# Patient Record
Sex: Female | Born: 1945
Health system: Southern US, Community
[De-identification: ages and names within clinical notes are randomized; demographics above are authoritative.]

## PROBLEM LIST (undated history)

## (undated) DIAGNOSIS — D519 Vitamin B12 deficiency anemia, unspecified: Secondary | ICD-10-CM

## (undated) DIAGNOSIS — I776 Arteritis, unspecified: Secondary | ICD-10-CM

## (undated) DIAGNOSIS — I2510 Atherosclerotic heart disease of native coronary artery without angina pectoris: Secondary | ICD-10-CM

## (undated) DIAGNOSIS — R52 Pain, unspecified: Secondary | ICD-10-CM

## (undated) DIAGNOSIS — K219 Gastro-esophageal reflux disease without esophagitis: Secondary | ICD-10-CM

## (undated) DIAGNOSIS — D696 Thrombocytopenia, unspecified: Secondary | ICD-10-CM

## (undated) DIAGNOSIS — M810 Age-related osteoporosis without current pathological fracture: Secondary | ICD-10-CM

## (undated) DIAGNOSIS — K5792 Diverticulitis of intestine, part unspecified, without perforation or abscess without bleeding: Secondary | ICD-10-CM

## (undated) DIAGNOSIS — D649 Anemia, unspecified: Secondary | ICD-10-CM

## (undated) DIAGNOSIS — M1990 Unspecified osteoarthritis, unspecified site: Secondary | ICD-10-CM

## (undated) DIAGNOSIS — I10 Essential (primary) hypertension: Secondary | ICD-10-CM

## (undated) DIAGNOSIS — J449 Chronic obstructive pulmonary disease, unspecified: Secondary | ICD-10-CM

## (undated) DIAGNOSIS — B349 Viral infection, unspecified: Secondary | ICD-10-CM

## (undated) DIAGNOSIS — E785 Hyperlipidemia, unspecified: Secondary | ICD-10-CM

## (undated) HISTORY — DX: Thrombocytopenia, unspecified: D69.6

## (undated) HISTORY — DX: Viral infection, unspecified: B34.9

## (undated) HISTORY — DX: Diverticulitis of intestine, part unspecified, without perforation or abscess without bleeding: K57.92

## (undated) HISTORY — DX: Unspecified osteoarthritis, unspecified site: M19.90

## (undated) HISTORY — PX: TUBAL LIGATION: SHX77

## (undated) HISTORY — DX: Essential (primary) hypertension: I10

## (undated) HISTORY — DX: Anemia, unspecified: D64.9

## (undated) HISTORY — DX: Chronic obstructive pulmonary disease, unspecified: J44.9

## (undated) HISTORY — DX: Pain, unspecified: R52

## (undated) HISTORY — PX: APPENDECTOMY: SHX54

## (undated) HISTORY — PX: ABDOMINAL HYSTERECTOMY: SHX81

## (undated) HISTORY — DX: Arteritis, unspecified: I77.6

## (undated) HISTORY — DX: Hyperlipidemia, unspecified: E78.5

## (undated) HISTORY — DX: Atherosclerotic heart disease of native coronary artery without angina pectoris: I25.10

---

## 2011-05-26 DIAGNOSIS — L738 Other specified follicular disorders: Secondary | ICD-10-CM | POA: Diagnosis not present

## 2011-05-26 DIAGNOSIS — B351 Tinea unguium: Secondary | ICD-10-CM | POA: Diagnosis not present

## 2011-06-30 DIAGNOSIS — H2510 Age-related nuclear cataract, unspecified eye: Secondary | ICD-10-CM | POA: Diagnosis not present

## 2011-10-06 DIAGNOSIS — E559 Vitamin D deficiency, unspecified: Secondary | ICD-10-CM | POA: Diagnosis not present

## 2011-10-06 DIAGNOSIS — E782 Mixed hyperlipidemia: Secondary | ICD-10-CM | POA: Diagnosis not present

## 2011-10-06 DIAGNOSIS — I10 Essential (primary) hypertension: Secondary | ICD-10-CM | POA: Diagnosis not present

## 2011-10-08 DIAGNOSIS — I10 Essential (primary) hypertension: Secondary | ICD-10-CM | POA: Diagnosis not present

## 2011-10-08 DIAGNOSIS — E782 Mixed hyperlipidemia: Secondary | ICD-10-CM | POA: Diagnosis not present

## 2011-10-08 DIAGNOSIS — E559 Vitamin D deficiency, unspecified: Secondary | ICD-10-CM | POA: Diagnosis not present

## 2011-11-15 DIAGNOSIS — S058X9A Other injuries of unspecified eye and orbit, initial encounter: Secondary | ICD-10-CM | POA: Diagnosis not present

## 2012-02-13 DIAGNOSIS — E782 Mixed hyperlipidemia: Secondary | ICD-10-CM | POA: Diagnosis not present

## 2012-02-13 DIAGNOSIS — M810 Age-related osteoporosis without current pathological fracture: Secondary | ICD-10-CM | POA: Diagnosis not present

## 2012-02-13 DIAGNOSIS — Z0000 Encounter for general adult medical examination without abnormal findings: Secondary | ICD-10-CM | POA: Diagnosis not present

## 2012-02-13 DIAGNOSIS — Z1231 Encounter for screening mammogram for malignant neoplasm of breast: Secondary | ICD-10-CM | POA: Diagnosis not present

## 2012-02-13 DIAGNOSIS — Z23 Encounter for immunization: Secondary | ICD-10-CM | POA: Diagnosis not present

## 2012-02-13 DIAGNOSIS — N390 Urinary tract infection, site not specified: Secondary | ICD-10-CM | POA: Diagnosis not present

## 2012-02-13 DIAGNOSIS — I10 Essential (primary) hypertension: Secondary | ICD-10-CM | POA: Diagnosis not present

## 2012-02-13 DIAGNOSIS — F17200 Nicotine dependence, unspecified, uncomplicated: Secondary | ICD-10-CM | POA: Diagnosis not present

## 2012-02-13 DIAGNOSIS — E7800 Pure hypercholesterolemia, unspecified: Secondary | ICD-10-CM | POA: Diagnosis not present

## 2012-02-23 DIAGNOSIS — Z1231 Encounter for screening mammogram for malignant neoplasm of breast: Secondary | ICD-10-CM | POA: Diagnosis not present

## 2012-02-23 DIAGNOSIS — M810 Age-related osteoporosis without current pathological fracture: Secondary | ICD-10-CM | POA: Diagnosis not present

## 2012-06-30 DIAGNOSIS — H2510 Age-related nuclear cataract, unspecified eye: Secondary | ICD-10-CM | POA: Diagnosis not present

## 2012-09-07 DIAGNOSIS — R1011 Right upper quadrant pain: Secondary | ICD-10-CM | POA: Diagnosis not present

## 2012-09-07 DIAGNOSIS — M5380 Other specified dorsopathies, site unspecified: Secondary | ICD-10-CM | POA: Diagnosis not present

## 2012-09-13 DIAGNOSIS — R1011 Right upper quadrant pain: Secondary | ICD-10-CM | POA: Diagnosis not present

## 2012-09-13 DIAGNOSIS — R109 Unspecified abdominal pain: Secondary | ICD-10-CM | POA: Diagnosis not present

## 2012-09-13 DIAGNOSIS — K824 Cholesterolosis of gallbladder: Secondary | ICD-10-CM | POA: Diagnosis not present

## 2012-09-20 DIAGNOSIS — I10 Essential (primary) hypertension: Secondary | ICD-10-CM | POA: Diagnosis not present

## 2012-09-30 DIAGNOSIS — Z1211 Encounter for screening for malignant neoplasm of colon: Secondary | ICD-10-CM | POA: Diagnosis not present

## 2012-11-22 DIAGNOSIS — F17200 Nicotine dependence, unspecified, uncomplicated: Secondary | ICD-10-CM | POA: Diagnosis not present

## 2012-11-22 DIAGNOSIS — I10 Essential (primary) hypertension: Secondary | ICD-10-CM | POA: Diagnosis not present

## 2012-11-22 DIAGNOSIS — E785 Hyperlipidemia, unspecified: Secondary | ICD-10-CM | POA: Diagnosis not present

## 2012-11-22 DIAGNOSIS — K644 Residual hemorrhoidal skin tags: Secondary | ICD-10-CM | POA: Diagnosis not present

## 2012-11-22 DIAGNOSIS — Z1211 Encounter for screening for malignant neoplasm of colon: Secondary | ICD-10-CM | POA: Diagnosis not present

## 2012-11-22 DIAGNOSIS — E559 Vitamin D deficiency, unspecified: Secondary | ICD-10-CM | POA: Diagnosis not present

## 2012-11-22 DIAGNOSIS — K648 Other hemorrhoids: Secondary | ICD-10-CM | POA: Diagnosis not present

## 2012-11-22 DIAGNOSIS — K589 Irritable bowel syndrome without diarrhea: Secondary | ICD-10-CM | POA: Diagnosis not present

## 2012-11-22 DIAGNOSIS — Z79899 Other long term (current) drug therapy: Secondary | ICD-10-CM | POA: Diagnosis not present

## 2012-11-22 DIAGNOSIS — M810 Age-related osteoporosis without current pathological fracture: Secondary | ICD-10-CM | POA: Diagnosis not present

## 2012-11-22 DIAGNOSIS — M818 Other osteoporosis without current pathological fracture: Secondary | ICD-10-CM | POA: Diagnosis not present

## 2012-11-22 DIAGNOSIS — K5730 Diverticulosis of large intestine without perforation or abscess without bleeding: Secondary | ICD-10-CM | POA: Diagnosis not present

## 2012-11-22 HISTORY — PX: COLONOSCOPY: SHX174

## 2012-11-22 LAB — HM COLONOSCOPY

## 2013-03-14 DIAGNOSIS — E782 Mixed hyperlipidemia: Secondary | ICD-10-CM | POA: Diagnosis not present

## 2013-03-14 DIAGNOSIS — Z0000 Encounter for general adult medical examination without abnormal findings: Secondary | ICD-10-CM | POA: Diagnosis not present

## 2013-03-14 DIAGNOSIS — E7800 Pure hypercholesterolemia, unspecified: Secondary | ICD-10-CM | POA: Diagnosis not present

## 2013-03-14 DIAGNOSIS — Z23 Encounter for immunization: Secondary | ICD-10-CM | POA: Diagnosis not present

## 2013-03-14 DIAGNOSIS — I10 Essential (primary) hypertension: Secondary | ICD-10-CM | POA: Diagnosis not present

## 2013-03-14 DIAGNOSIS — Z1231 Encounter for screening mammogram for malignant neoplasm of breast: Secondary | ICD-10-CM | POA: Diagnosis not present

## 2013-03-14 DIAGNOSIS — M810 Age-related osteoporosis without current pathological fracture: Secondary | ICD-10-CM | POA: Diagnosis not present

## 2013-03-14 DIAGNOSIS — E559 Vitamin D deficiency, unspecified: Secondary | ICD-10-CM | POA: Diagnosis not present

## 2013-04-08 DIAGNOSIS — M810 Age-related osteoporosis without current pathological fracture: Secondary | ICD-10-CM | POA: Diagnosis not present

## 2013-04-08 DIAGNOSIS — Z1231 Encounter for screening mammogram for malignant neoplasm of breast: Secondary | ICD-10-CM | POA: Diagnosis not present

## 2013-04-08 LAB — HM DEXA SCAN

## 2013-07-01 DIAGNOSIS — H2510 Age-related nuclear cataract, unspecified eye: Secondary | ICD-10-CM | POA: Diagnosis not present

## 2013-10-12 DIAGNOSIS — R5383 Other fatigue: Secondary | ICD-10-CM | POA: Diagnosis not present

## 2013-10-12 DIAGNOSIS — S30860A Insect bite (nonvenomous) of lower back and pelvis, initial encounter: Secondary | ICD-10-CM | POA: Diagnosis not present

## 2013-10-12 DIAGNOSIS — A938 Other specified arthropod-borne viral fevers: Secondary | ICD-10-CM | POA: Diagnosis not present

## 2013-10-12 DIAGNOSIS — R5381 Other malaise: Secondary | ICD-10-CM | POA: Diagnosis not present

## 2013-10-12 DIAGNOSIS — R110 Nausea: Secondary | ICD-10-CM | POA: Diagnosis not present

## 2013-10-13 DIAGNOSIS — R509 Fever, unspecified: Secondary | ICD-10-CM | POA: Diagnosis not present

## 2013-10-13 DIAGNOSIS — R51 Headache: Secondary | ICD-10-CM | POA: Diagnosis not present

## 2013-10-13 DIAGNOSIS — D696 Thrombocytopenia, unspecified: Secondary | ICD-10-CM | POA: Diagnosis not present

## 2013-10-13 DIAGNOSIS — B9789 Other viral agents as the cause of diseases classified elsewhere: Secondary | ICD-10-CM | POA: Diagnosis not present

## 2013-10-18 DIAGNOSIS — D696 Thrombocytopenia, unspecified: Secondary | ICD-10-CM | POA: Diagnosis not present

## 2013-10-18 DIAGNOSIS — J069 Acute upper respiratory infection, unspecified: Secondary | ICD-10-CM | POA: Diagnosis not present

## 2013-10-18 DIAGNOSIS — E871 Hypo-osmolality and hyponatremia: Secondary | ICD-10-CM | POA: Diagnosis not present

## 2013-10-28 DIAGNOSIS — R1032 Left lower quadrant pain: Secondary | ICD-10-CM | POA: Diagnosis not present

## 2013-10-28 DIAGNOSIS — N3000 Acute cystitis without hematuria: Secondary | ICD-10-CM | POA: Diagnosis not present

## 2013-10-28 DIAGNOSIS — R935 Abnormal findings on diagnostic imaging of other abdominal regions, including retroperitoneum: Secondary | ICD-10-CM | POA: Diagnosis not present

## 2013-10-28 DIAGNOSIS — R52 Pain, unspecified: Secondary | ICD-10-CM

## 2013-10-28 DIAGNOSIS — I776 Arteritis, unspecified: Secondary | ICD-10-CM | POA: Diagnosis not present

## 2013-10-28 HISTORY — DX: Pain, unspecified: R52

## 2013-11-01 DIAGNOSIS — I776 Arteritis, unspecified: Secondary | ICD-10-CM

## 2013-11-01 HISTORY — DX: Arteritis, unspecified: I77.6

## 2013-11-08 ENCOUNTER — Encounter: Payer: Self-pay | Admitting: Surgery

## 2013-11-25 ENCOUNTER — Encounter: Payer: Self-pay | Admitting: Surgery

## 2013-11-28 ENCOUNTER — Encounter: Payer: Self-pay | Admitting: Surgery

## 2013-11-28 ENCOUNTER — Ambulatory Visit (INDEPENDENT_AMBULATORY_CARE_PROVIDER_SITE_OTHER): Payer: Medicare Other | Admitting: Surgery

## 2013-11-28 VITALS — BP 158/70 | HR 70 | Ht 64.0 in | Wt 121.5 lb

## 2013-11-28 DIAGNOSIS — I714 Abdominal aortic aneurysm, without rupture, unspecified: Secondary | ICD-10-CM

## 2013-11-28 DIAGNOSIS — I776 Arteritis, unspecified: Secondary | ICD-10-CM | POA: Diagnosis not present

## 2013-11-28 NOTE — Progress Notes (Signed)
Patient name: Carla Romero MRN: [id number] DOB: 27-Jan-1946 Sex: female   Referred by: Dr. Sedalia Muta  Reason for referral:  Chief Complaint  Patient presents with  . New Evaluation    aortitis    HISTORY OF PRESENT ILLNESS: This is a 68 year old female who is referred today for evaluation of abdominal pathology seen on CT scan.  The patient's story begins around father is day.  She was confused initially brought to urgent care and then sent to the emergency department.  She had received several take bites and was treated with doxycycline for [facility] spotted fever.  Ultimately, she was diagnosed with line disease and treated with amoxicillin.  In mid July she began complaining of low back pain and abdominal pain which radiated around to her groin.  She has a history of diverticulosis and therefore was sent to the hospital for CT scan of her abdomen and pelvis.  This revealed some inflammatory changes around her aorta.  She went on to have an elevated C-reactive protein.  She was referred today for vascular evaluation.  The patient is medically managed for hypertension.  She is a smoker.  Past Medical History  Diagnosis Date  . Pain October 28, 2013    Left Lower quadrant / back  . Hyperlipidemia   . Hypertension   . Arthritis     Osteoporosis  . Diverticulitis   . Anemia     Vit D deficiency  . Thrombocytopenia   . Acute viral syndrome   . COPD (chronic obstructive pulmonary disease)   . CAD (coronary artery disease)   . CAP (community acquired pneumonia)   . Diabetes mellitus without complication   . Arteritis, unspecified November 01, 2013    Past Surgical History  Procedure Laterality Date  . Colonoscopy  Aug. 4, 2014  . Abdominal hysterectomy    . Av fistula placement      History   Social History  . Marital Status: Widowed    Spouse Name: N/A    Number of Children: N/A  . Years of Education: N/A   Occupational History  . Not on file.   Social History Main  Topics  . Smoking status: Current Every Day Smoker -- 0.25 packs/day for 40 years    Types: Cigarettes  . Smokeless tobacco: Never Used  . Alcohol Use: 2.8 - 3.9 oz/week    3-4 Glasses of wine, 2-3 Drinks containing 0.5 oz of alcohol per week  . Drug Use: No  . Sexual Activity: Not on file   Other Topics Concern  . Not on file   Social History Narrative  . No narrative on file    Family History  Problem Relation Age of Onset  . Hypertension Mother   . Hyperlipidemia Mother     Allergies as of 11/28/2013 - Review Complete 11/28/2013  Allergen Reaction Noted  . Ace inhibitors Cough 11/08/2013  . Trilipix [choline fenofibrate] Other (See Comments) 11/08/2013  . Zocor [simvastatin] Other (See Comments) 11/08/2013    Current Outpatient Prescriptions on File Prior to Visit  Medication Sig Dispense Refill  . losartan-hydrochlorothiazide (HYZAAR) 100-25 MG per tablet Take 1 tablet by mouth daily.      [name] amoxicillin (AMOXIL) 875 MG tablet Take 875 mg by mouth every 12 (twelve) hours.      . calcium-vitamin D (OSCAL WITH D) 250-125 MG-UNIT per tablet Take 1 tablet by mouth daily.      . traMADol (ULTRAM) 50 MG tablet Take by mouth  every 6 (six) hours as needed. 1/2   TO  1  Tab  Every 6 hours       No current facility-administered medications on file prior to visit.     REVIEW OF SYSTEMS: Please see history of present illness, otherwise all systems are negative  PHYSICAL EXAMINATION: General: The patient appears their stated age.  Vital signs are BP 158/70  Pulse 70  Ht 5\' 4"  (1.626 m)  Wt 121 lb 8 oz (55.112 kg)  BMI 20.85 kg/m2  SpO2 100% HEENT:  No gross abnormalities Pulmonary: Respirations are non-labored Abdomen: Soft and non-tender.  Aorta is easily palpable and nontender  Musculoskeletal: There are no major deformities.   Neurologic: No focal weakness or paresthesias are detected, Skin: There are no ulcer or rashes noted. Psychiatric: The patient has normal  affect. Cardiovascular: There is a regular rate and rhythm without significant murmur appreciated.  No carotid bruit  Diagnostic Studies: I have reviewed her CT scan which shows inflammatory changes around her infrarenal aorta.  The aorta is thickwalled with a maximum diameter of 2.8 cm    Assessment:  Possible aortitis by CT scan Plan: The patient most likely had inflammatory changes secondary to her line disease.  Currently, she is asymptomatic.  There is minimal aneurysmal change to the aorta.  There is no fall for surgical intervention at this time, however I do think that she warrants repeat imaging to see if the inflammatory process has gotten worse or resolve.  I suspect that this will go a with time.  I will have her back to see me in 3 months with a repeat CT scan.  If there is persistence of the aortitis, she may benefit from referral to a rheumatologist     V. Charlena CrossWells Cheynne Virden IV, M.D. [facility] [place] Office: [phone] Pager:  [phone]

## 2013-11-28 NOTE — Addendum Note (Signed)
Addended by: Sharee PimpleMCCHESNEY, MARILYN K on: 11/28/2013 12:29 PM   Modules accepted: Orders

## 2014-01-30 ENCOUNTER — Other Ambulatory Visit: Payer: Self-pay | Admitting: Surgery

## 2014-01-30 DIAGNOSIS — I714 Abdominal aortic aneurysm, without rupture: Secondary | ICD-10-CM | POA: Diagnosis not present

## 2014-01-30 LAB — CREATININE, SERUM: Creat: 1 mg/dL (ref 0.50–1.10)

## 2014-01-30 LAB — BUN: BUN: 13 mg/dL (ref 6–23)

## 2014-03-03 ENCOUNTER — Encounter: Payer: Self-pay | Admitting: Surgery

## 2014-03-06 ENCOUNTER — Encounter: Payer: Self-pay | Admitting: *Deleted

## 2014-03-06 ENCOUNTER — Ambulatory Visit
Admission: RE | Admit: 2014-03-06 | Discharge: 2014-03-06 | Disposition: A | Discharge status: Home / Self Care | Payer: Medicare Other | Source: Ambulatory Visit | Attending: Surgery | Admitting: Surgery

## 2014-03-06 ENCOUNTER — Ambulatory Visit (INDEPENDENT_AMBULATORY_CARE_PROVIDER_SITE_OTHER): Payer: Medicare Other | Admitting: Surgery

## 2014-03-06 ENCOUNTER — Encounter: Payer: Self-pay | Admitting: Surgery

## 2014-03-06 VITALS — BP 171/86 | HR 76 | Temp 98.0°F | Resp 16 | Ht 64.5 in | Wt 123.8 lb

## 2014-03-06 DIAGNOSIS — I701 Atherosclerosis of renal artery: Secondary | ICD-10-CM | POA: Diagnosis not present

## 2014-03-06 DIAGNOSIS — I714 Abdominal aortic aneurysm, without rupture, unspecified: Secondary | ICD-10-CM

## 2014-03-06 DIAGNOSIS — I776 Arteritis, unspecified: Secondary | ICD-10-CM | POA: Diagnosis not present

## 2014-03-06 MED ORDER — IOHEXOL 350 MG/ML SOLN
80.0000 mL | Freq: Once | INTRAVENOUS | Status: AC | PRN
  Administered 2014-03-06: 80 mL via INTRAVENOUS

## 2014-03-06 NOTE — Addendum Note (Signed)
Addended by: Sharee PimpleMCCHESNEY, Maryann Mccall K on: 03/06/2014 01:35 PM   Modules accepted: Orders

## 2014-03-06 NOTE — Progress Notes (Signed)
Patient name: Carla DanielKathryn Bearse MRN: [id number] DOB: 09/15/1945 Sex: female     Chief Complaint  Patient presents with  . Follow-up    Patient has not had any more abdominal or lower back pain     HISTORY OF PRESENT ILLNESS: This is a 68 year old female who is referred today for evaluation of abdominal pathology seen on CT scan. The patient's story begins around father is day. She was confused initially brought to urgent care and then sent to the emergency department. She had received several take bites and was [facility]ine [facility]. Ultimately, she was diagnosed with line disease and treated with amoxicillin. In mid July she began complaining of low back pain and abdominal pain which radiated around to her groin. She has a history of diverticulosis and therefore was sent to the hospital for CT scan of her abdomen and pelvis. This revealed some inflammatory changes around her aorta. She went on to have an elevated C-reactive protein. She was referred today for vascular evaluation.  The patient reports no complaints today.  Her back pain has resolved.  Past Medical History  Diagnosis Date  . Pain October 28, 2013    Left Lower quadrant / back  . Hyperlipidemia   . Hypertension   . Arthritis     Osteoporosis  . Diverticulitis   . Anemia     Vit D deficiency  . Thrombocytopenia   . Acute viral syndrome   . COPD (chronic obstructive pulmonary disease)   . CAD (coronary artery disease)   . CAP (community acquired pneumonia)   . Diabetes mellitus without complication   . Arteritis, unspecified November 01, 2013    Past Surgical History  Procedure Laterality Date  . Colonoscopy  Aug. 4, 2014  . Abdominal hysterectomy    . Av fistula placement      History   Social History  . Marital Status: Widowed    Spouse Name: N/A    Number of Children: N/A  . Years of Education: N/A   Occupational History  . Not on file.   Social History Main  Topics  . Smoking status: Current Every Day Smoker -- 0.30 packs/day for 40 years    Types: Cigarettes  . Smokeless tobacco: Never Used  . Alcohol Use: 3.0 - 4.2 oz/week    3-4 Glasses of wine, 2-3 Not specified per week  . Drug Use: No  . Sexual Activity: Not on file   Other Topics Concern  . Not on file   Social History Narrative    Family History  Problem Relation Age of Onset  . Hypertension Mother   . Hyperlipidemia Mother     Allergies as of 03/06/2014 - Review Complete 03/06/2014  Allergen Reaction Noted  . Ace inhibitors Cough 11/08/2013  . Trilipix [choline fenofibrate] Other (See Comments) 11/08/2013  . Zocor [simvastatin] Other (See Comments) 11/08/2013    Current Outpatient Prescriptions on File Prior to Visit  Medication Sig Dispense Refill  . amoxicillin (AMOXIL) 875 MG tablet Take 875 mg by mouth every 12 (twelve) hours.    . calcium-vitamin D (OSCAL WITH D) 250-125 MG-UNIT per tablet Take 1 tablet by mouth daily.    [name]. losartan-hydrochlorothiazide (HYZAAR) 100-25 MG per tablet Take 1 tablet by mouth daily.    . traMADol (ULTRAM) 50 MG tablet Take by mouth every 6 (six) hours as needed. 1/2   TO  1  Tab  Every 6 hours  No current facility-administered medications on file prior to visit.     REVIEW OF SYSTEMS: Cardiovascular: No chest pain, chest pressure, palpitations, orthopnea, or dyspnea on exertion. No claudication or rest pain,  No history of DVT or phlebitis. Pulmonary: No productive cough, asthma or wheezing. Neurologic: No weakness, paresthesias, aphasia, or amaurosis. No dizziness. Hematologic: No bleeding problems or clotting disorders. Musculoskeletal: No joint pain or joint swelling. Gastrointestinal: No blood in stool or hematemesis Genitourinary: No dysuria or hematuria. Psychiatric:: No history of major depression. Integumentary: No rashes or ulcers. Constitutional: No fever or chills.  PHYSICAL EXAMINATION:   Vital signs are BP  171/86 mmHg  Pulse 76  Temp(Src) 98 F (36.7 C) (Oral)  Resp 16  Ht 5' 4.5" (1.638 m)  Wt 123 lb 12.8 oz (56.155 kg)  BMI 20.93 kg/m2  SpO2 100% General: The patient appears their stated age. HEENT:  No gross abnormalities Pulmonary:  Non labored breathing Abdomen: Soft and non-tender Musculoskeletal: There are no major deformities. Neurologic: No focal weakness or paresthesias are detected, Skin: There are no ulcer or rashes noted. Psychiatric: The patient has normal affect. Cardiovascular: palpable pedal pulses   Diagnostic Studies I have reviewed her CT angiogram which has the following observations: Significant improvement if not complete resolution of inflammatory changes around the aorta.  I do see thrombus within the aorta at the level of the visceral section and a small ulcer  Assessment: aortitis Plan: The patient's CT scan today shows near complete resolution.  There is ectasia of the aorta which will need to be followed.  I also see an ulcer which is very small at her renal arteries.  For that reason, she will need continued observation.  I would recommend a CT scan periodically.  Otherwise, I will follow her with ultrasound, the next will be in one year.  Jorge NyV. Wells Brabham IV, M.D. [facility] [place] Office: [phone] Pager:  [phone]

## 2014-03-06 NOTE — Patient Instructions (Signed)
Please review the tobacco cessation information given to you today. It lists many hints that are useful in your effort to stop smoking. The [employer] [facility] contact phone # is [phone] These nurses and advisors offer lots of FREE information and aids to help you quit.    The [place] [facility]uit Smoking line #  800-784-8669, they will also assist you with programs designed to help you stop smoking.    

## 2014-03-20 DIAGNOSIS — Z0000 Encounter for general adult medical examination without abnormal findings: Secondary | ICD-10-CM | POA: Diagnosis not present

## 2014-03-20 DIAGNOSIS — E782 Mixed hyperlipidemia: Secondary | ICD-10-CM | POA: Diagnosis not present

## 2014-03-20 DIAGNOSIS — Z23 Encounter for immunization: Secondary | ICD-10-CM | POA: Diagnosis not present

## 2014-03-20 DIAGNOSIS — I10 Essential (primary) hypertension: Secondary | ICD-10-CM | POA: Diagnosis not present

## 2014-04-11 DIAGNOSIS — Z1231 Encounter for screening mammogram for malignant neoplasm of breast: Secondary | ICD-10-CM | POA: Diagnosis not present

## 2015-03-12 ENCOUNTER — Other Ambulatory Visit (HOSPITAL_COMMUNITY): Payer: Medicare Other

## 2015-03-12 ENCOUNTER — Ambulatory Visit: Payer: Medicare Other | Admitting: Surgery

## 2015-07-17 DIAGNOSIS — H2513 Age-related nuclear cataract, bilateral: Secondary | ICD-10-CM | POA: Diagnosis not present

## 2015-09-26 DIAGNOSIS — I10 Essential (primary) hypertension: Secondary | ICD-10-CM | POA: Diagnosis not present

## 2015-09-26 DIAGNOSIS — E782 Mixed hyperlipidemia: Secondary | ICD-10-CM | POA: Diagnosis not present

## 2015-10-02 DIAGNOSIS — L814 Other melanin hyperpigmentation: Secondary | ICD-10-CM | POA: Diagnosis not present

## 2015-10-02 DIAGNOSIS — L570 Actinic keratosis: Secondary | ICD-10-CM | POA: Diagnosis not present

## 2016-01-20 DIAGNOSIS — L2089 Other atopic dermatitis: Secondary | ICD-10-CM | POA: Diagnosis not present

## 2016-01-20 DIAGNOSIS — L299 Pruritus, unspecified: Secondary | ICD-10-CM | POA: Diagnosis not present

## 2016-02-08 DIAGNOSIS — R202 Paresthesia of skin: Secondary | ICD-10-CM | POA: Diagnosis not present

## 2016-02-08 DIAGNOSIS — M79662 Pain in left lower leg: Secondary | ICD-10-CM | POA: Diagnosis not present

## 2016-02-08 DIAGNOSIS — Z23 Encounter for immunization: Secondary | ICD-10-CM | POA: Diagnosis not present

## 2016-02-08 DIAGNOSIS — M79661 Pain in right lower leg: Secondary | ICD-10-CM | POA: Diagnosis not present

## 2016-02-23 IMAGING — CT CT CTA ABD/PEL W/CM AND/OR W/O CM
1 of 7 series · 12 of 36 positions shown, 18 images · IV contrast (80CC OMNI 350)
Comparison: CT scan of October 28, 2013.

CLINICAL DATA: Abdominal aortic aneurysm without rupture.

EXAM:
CTA ABDOMEN AND PELVIS wITHOUT AND WITH CONTRAST
TECHNIQUE: Multidetector CT imaging of the abdomen and pelvis was performed
using the standard protocol during bolus administration of
intravenous contrast. Multiplanar reconstructed images and MIPs were
obtained and reviewed to evaluate the vascular anatomy.
CONTRAST:  80mL OMNIPAQUE IOHEXOL 350 MG/ML SOLN

[Series 4: angio (id) · axial · 0.68mm/px · z∈[-420,-60]mm · 12 of 170 slices shown, 18 images]
[im 13/170  soft-tissue]
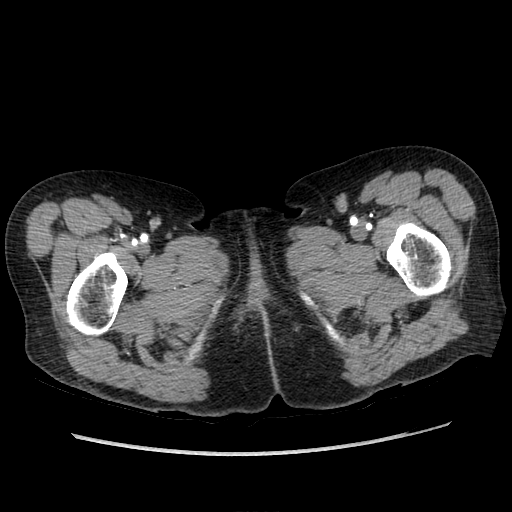
[im 13/170  bone]
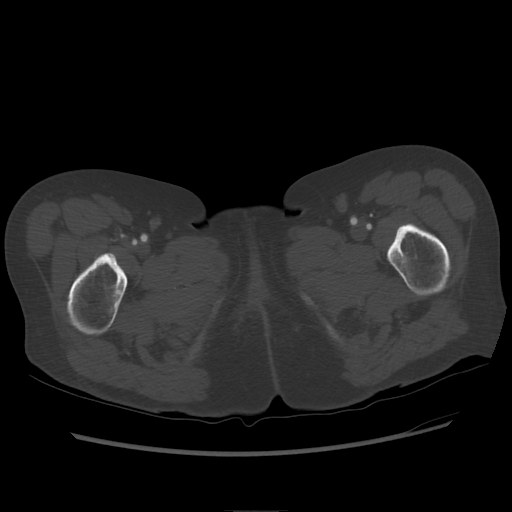
[im 25/170  soft-tissue]
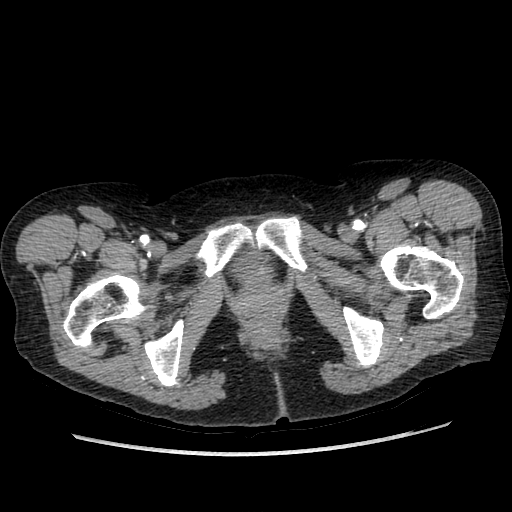
[im 37/170  soft-tissue]
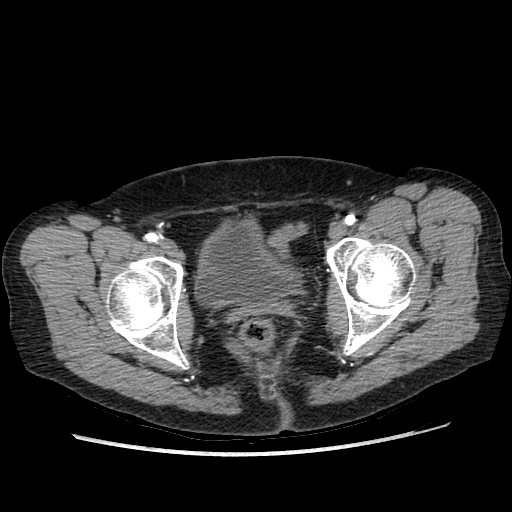
[im 49/170  soft-tissue]
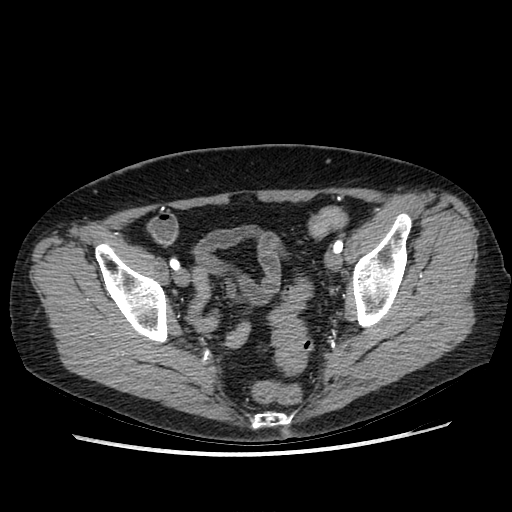
[im 61/170  soft-tissue]
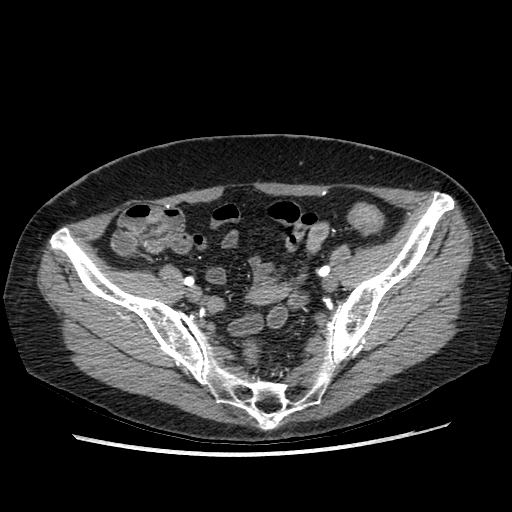
[im 73/170  soft-tissue]
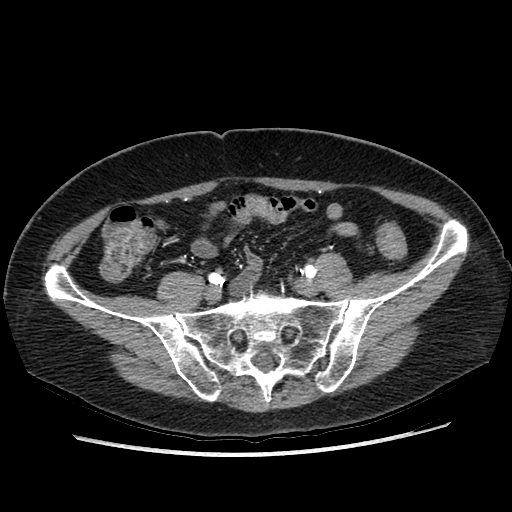
[im 97/170  soft-tissue]
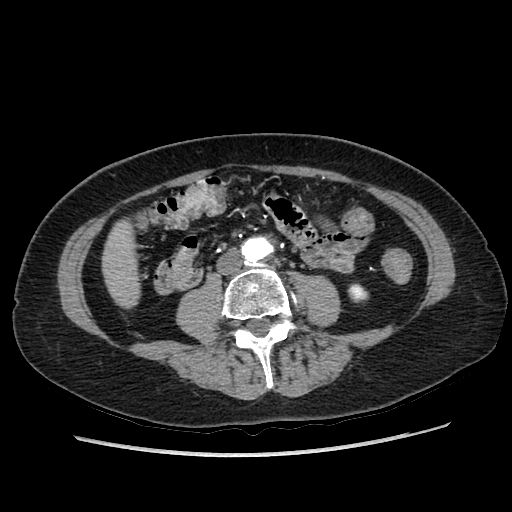
[im 109/170  soft-tissue]
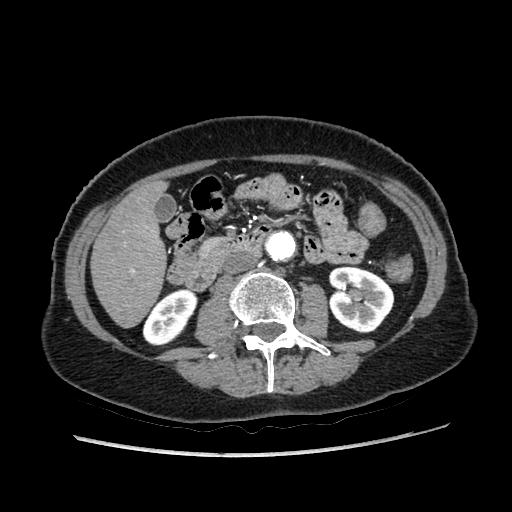
[im 121/170  soft-tissue]
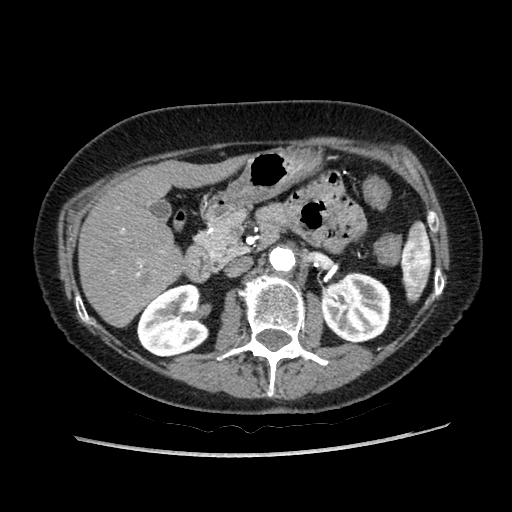
[im 121/170  lung]
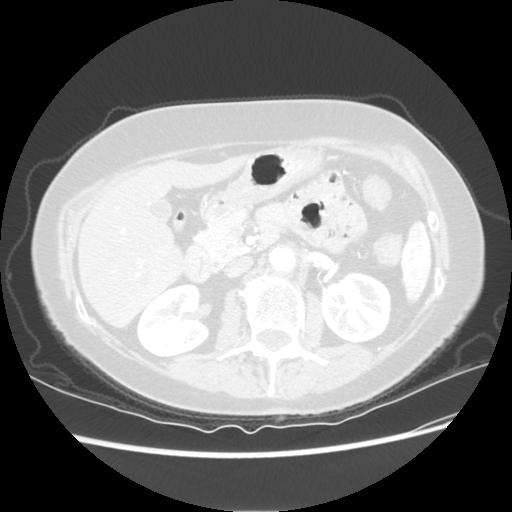
[im 121/170  bone]
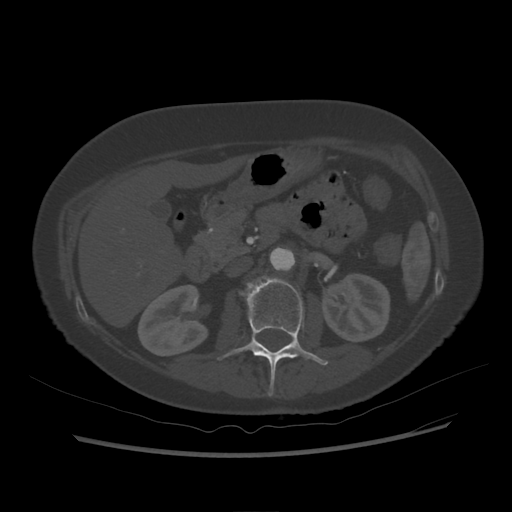
[im 133/170  soft-tissue]
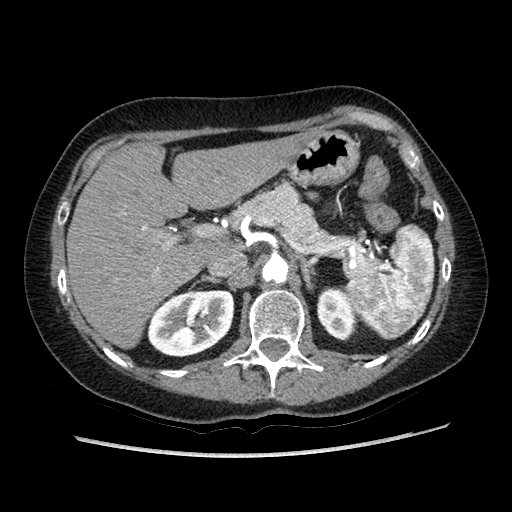
[im 133/170  lung]
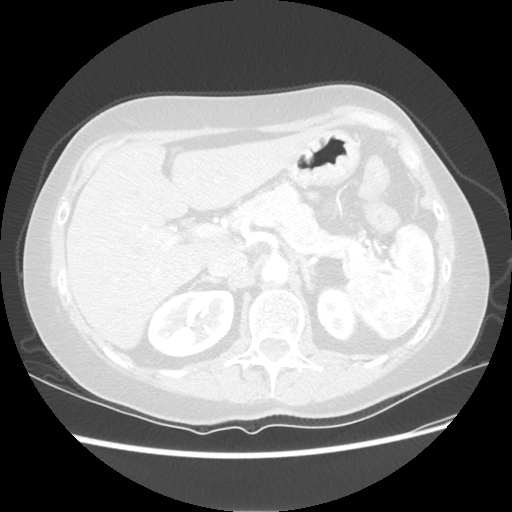
[im 145/170  soft-tissue]
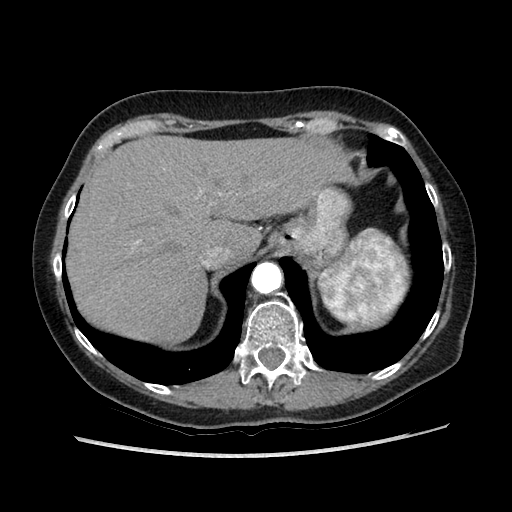
[im 145/170  lung]
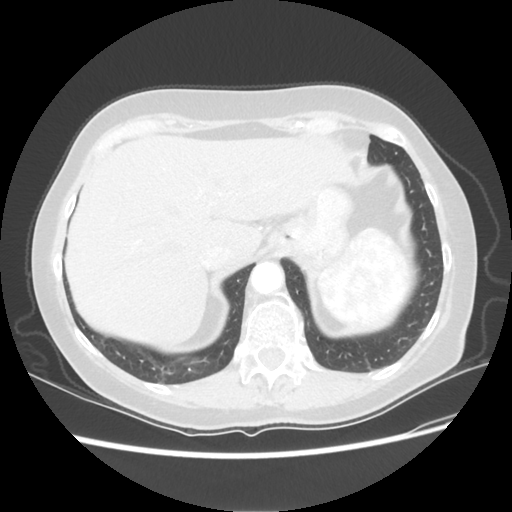
[im 157/170  soft-tissue]
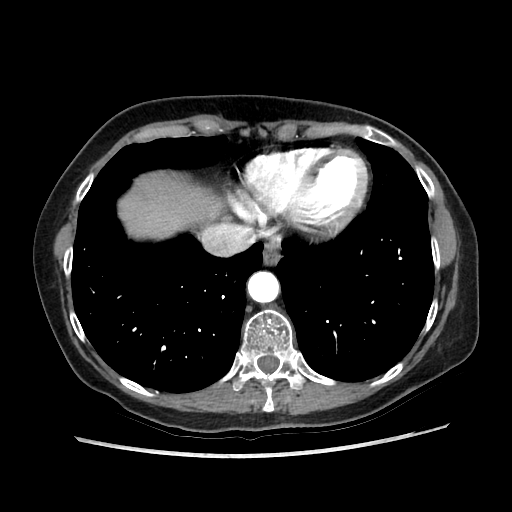
[im 157/170  lung]
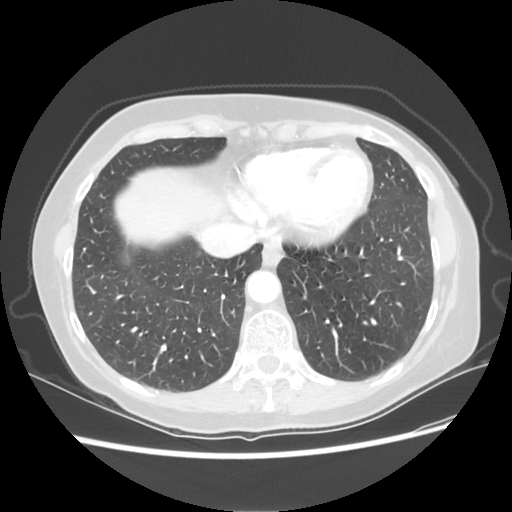

[12 of 36 positions shown; findings below may reference images not displayed]

FINDINGS: Multilevel degenerative disc disease is noted in the lumbar spine.
Visualized lung bases appear normal.

No gallstones are noted. 9 mm enhancing abnormality is noted in the
anterior segment of the right hepatic lobe most likely representing
hemangioma or some form of small vascular shunt. No other focal
abnormality is noted in the liver. The spleen and pancreas appear
normal. Adrenal glands and kidneys appear normal. No hydronephrosis
or renal obstruction is noted. No renal or ureteral calculi are
noted. There is no evidence of bowel obstruction. There is no
evidence of abdominal aortic aneurysm or dissection. Aorta has a
maximum measured AP diameter of 2.5 cm. The mesenteric arteries are
widely patent without significant stenosis. There is noted a severe
focal stenosis near the origin of the left renal artery, as well as
near the origin of the right renal artery. Atherosclerotic
calcifications are noted in the abdominal aorta and iliac arteries.
No significant stenosis is noted in the iliac arteries. Status post
hysterectomy. Urinary bladder appears normal. There does not appear
to be any evidence of aortitis at this time. No significant
adenopathy is noted.

Review of the MIP images confirms the above findings.
IMPRESSION: No evidence of abdominal aortic aneurysm or dissection. There does
not appear to be any definite evidence of aortitis seen at this
time.

However, there are noted severe focal stenoses near the origins of
both renal arteries.

Also noted is enhancing linear abnormality in the anterior segment
of the right hepatic lobe most likely representing hemangioma or
some form of small vascular shunt.

## 2016-02-29 ENCOUNTER — Encounter: Payer: Self-pay | Admitting: Surgery

## 2016-03-06 ENCOUNTER — Ambulatory Visit (INDEPENDENT_AMBULATORY_CARE_PROVIDER_SITE_OTHER): Payer: PPO | Admitting: Surgery

## 2016-03-06 VITALS — BP 138/78 | HR 70 | Temp 97.2°F | Resp 18 | Ht 64.0 in | Wt 125.0 lb

## 2016-03-06 DIAGNOSIS — I70213 Atherosclerosis of native arteries of extremities with intermittent claudication, bilateral legs: Secondary | ICD-10-CM | POA: Diagnosis not present

## 2016-03-06 MED ORDER — CILOSTAZOL 100 MG PO TABS: 100.0000 mg | ORAL_TABLET | Freq: Two times a day (BID) | ORAL | 11 refills | Status: DC

## 2016-03-06 NOTE — Progress Notes (Signed)
[facility]  Patient name: Carla CraftsKathryn G Romero MRN: [id number] DOB: 31-Mar-1946 Sex: female  REASON FOR VISIT: new complaint: leg pain  HPI: Carla Romero is a 70 y.o. female returns today for follow-up of bilateral leg pain.  She is previously known to me, having been evaluated for abdominal and back pain with inflammatory changes around her aorta which resolved.  The patient states that she has pain in both of her legs with the left being worse than the right.  She initially feels weak and then has trembling.  This starts with walking.  It ultimately inset with cramping which is alleviated with rest.  She denies any open wounds or rest pain.  She states that she can walk approximately 10 minutes before her symptoms start.  The patient has a history of hypercholesterolemia.  She has been in tolerant of statins.  She is now taking Zetia.  She continues to smoke.  She is medically managed for hypertension and diabetes.  She does take an aspirin.  Past Medical History:  Diagnosis Date  . Acute viral syndrome   . Anemia    Vit D deficiency  . Arteritis, unspecified November 01, 2013  . Arthritis    Osteoporosis  . CAD (coronary artery disease)   . CAP (community acquired pneumonia)   . COPD (chronic obstructive pulmonary disease)   . Diabetes mellitus without complication   . Diverticulitis   . Hyperlipidemia   . Hypertension   . Pain October 28, 2013   Left Lower quadrant / back  . Thrombocytopenia     Family History  Problem Relation Age of Onset  . Hypertension Mother   . Hyperlipidemia Mother     SOCIAL HISTORY: Social History  Substance Use Topics  . Smoking status: Current Every Day Smoker    Packs/day: 0.30    Years: 40.00    Types: Cigarettes  . Smokeless tobacco: Never Used  . Alcohol use 3.0 - 4.2 oz/week    3 - 4 Glasses of wine, 2 - 3 Standard drinks or equivalent per week    Allergies  Allergen  Reactions  . Ace Inhibitors Cough  . Trilipix [Choline Fenofibrate] Other (See Comments)    Muscle cramps, muscle stiffness  . Zocor [Simvastatin] Other (See Comments)    Myalgia    Current Outpatient Prescriptions  Medication Sig Dispense Refill  . aspirin EC 81 MG tablet Take 81 mg by mouth daily.    . calcium-vitamin D (OSCAL WITH D) 250-125 MG-UNIT per tablet Take 1 tablet by mouth daily.    [name]. ezetimibe (ZETIA) 10 MG tablet     . losartan-hydrochlorothiazide (HYZAAR) 100-25 MG per tablet Take 1 tablet by mouth daily.    . Omega-3 Fatty Acids (FISH OIL BURP-LESS PO) Take by mouth.     No current facility-administered medications for this visit.     REVIEW OF SYSTEMS:  [X]  denotes positive finding, [ ]  denotes negative finding Cardiac  Comments:  Chest pain or chest pressure:    Shortness of breath upon exertion: x   Short of breath when lying flat:    Irregular heart rhythm:        Vascular    Pain in calf, thigh, or hip brought on by ambulation: x   Pain in feet at night that wakes you up from your sleep:     Blood clot in your veins:    Leg swelling:  Pulmonary    Oxygen at home:    Productive cough:     Wheezing:         Neurologic    Sudden weakness in arms or legs:  x   Sudden numbness in arms or legs:     Sudden onset of difficulty speaking or slurred speech:    Temporary loss of vision in one eye:     Problems with dizziness:         Gastrointestinal    Blood in stool:     Vomited blood:         Genitourinary    Burning when urinating:     Blood in urine:        Psychiatric    Major depression:         Hematologic    Bleeding problems:    Problems with blood clotting too easily:        Skin    Rashes or ulcers:        Constitutional    Fever or chills:      PHYSICAL EXAM: Vitals:   03/06/16 1023  BP: 138/78  Pulse: 70  Resp: 18  Temp: 97.2 F (36.2 C)  TempSrc: Oral  SpO2: 97%  Weight: 125 lb (56.7 kg)  Height: 5\' 4"   (1.626 m)    GENERAL: The patient is a well-nourished female, in no acute distress. The vital signs are documented above. CARDIAC: There is a regular rate and rhythm.  VASCULAR: Nonpalpable pedal pulses PULMONARY: There is good air exchange bilaterally without wheezing or rales. MUSCULOSKELETAL: There are no major deformities or cyanosis. NEUROLOGIC: No focal weakness or paresthesias are detected. SKIN: There are no ulcers or rashes noted. PSYCHIATRIC: The patient has a normal affect.  DATA:  I have reviewed her ultrasound which shows bilateral superficial femoral artery stenosis/occlusion  MEDICAL ISSUES: Bilateral claudication, left greater than right: I discussed the initial management would be medical treatment consisting of cholesterol control blood pressure control, diabetes management, smoking cessation, and antiplatelet therapy.  In addition I am going to start her on cilostazol.  We discussed starting an exercise program.  She is to follow-up in 3 months.  If her symptoms deteriorate or if she gets a wound or rest pain we will consider intervention earlier.  Hopefully she can be medically managed and not have to undergo invasive treatment.    Durene CalWells Brabham, MD [facility] Tel [phone] Pager [phone]

## 2016-03-06 NOTE — Addendum Note (Signed)
Addended by: Sharee PimpleMCCHESNEY, Evany Schecter K on: 03/06/2016 11:55 AM   Modules accepted: Orders

## 2016-03-12 ENCOUNTER — Encounter: Payer: Self-pay | Admitting: Medical

## 2016-03-24 DIAGNOSIS — Z0000 Encounter for general adult medical examination without abnormal findings: Secondary | ICD-10-CM | POA: Diagnosis not present

## 2016-03-24 DIAGNOSIS — Z6821 Body mass index (BMI) 21.0-21.9, adult: Secondary | ICD-10-CM | POA: Diagnosis not present

## 2016-03-24 DIAGNOSIS — E782 Mixed hyperlipidemia: Secondary | ICD-10-CM | POA: Diagnosis not present

## 2016-03-27 DIAGNOSIS — Z1231 Encounter for screening mammogram for malignant neoplasm of breast: Secondary | ICD-10-CM | POA: Diagnosis not present

## 2016-03-27 LAB — HM MAMMOGRAPHY

## 2016-04-21 HISTORY — PX: FEMORAL ARTERY STENT: SHX1583

## 2016-06-10 ENCOUNTER — Encounter: Payer: Self-pay | Admitting: Surgery

## 2016-06-16 ENCOUNTER — Ambulatory Visit (INDEPENDENT_AMBULATORY_CARE_PROVIDER_SITE_OTHER): Payer: PPO | Admitting: Surgery

## 2016-06-16 ENCOUNTER — Other Ambulatory Visit: Payer: Self-pay

## 2016-06-16 ENCOUNTER — Encounter: Payer: Self-pay | Admitting: Surgery

## 2016-06-16 VITALS — BP 190/91 | HR 86 | Temp 97.5°F | Resp 16 | Ht 64.0 in | Wt 130.0 lb

## 2016-06-16 DIAGNOSIS — I70213 Atherosclerosis of native arteries of extremities with intermittent claudication, bilateral legs: Secondary | ICD-10-CM

## 2016-06-16 NOTE — Progress Notes (Signed)
[facility] [place]  Patient name: Carla Romero MRN: [id number] DOB: [birth year] Sex: female   REASON FOR VISIT:    Follow up claudication  HISOTRY OF PRESENT ILLNESS:    Carla Romero is a 71 y.o. female returns today for follow-up of her claudication.  We initially began a exercise program with cilostazol.  She states that she did not get any benefit from her cilostazol.  She states that her leg pain is actually worse now.  She states that her legs start to hurt sooner.  The patient has a history of hypercholesterolemia.  She has been intolerant of statins.  She is now taking Zetia.  She continues to smoke.  She is medically managed for hypertension and diabetes.  She does take an aspirin.  PAST MEDICAL HISTORY:   Past Medical History:  Diagnosis Date  . Acute viral syndrome   . Anemia    Vit D deficiency  . Arteritis, unspecified November 01, 2013  . Arthritis    Osteoporosis  . CAD (coronary artery disease)   . CAP (community acquired pneumonia)   . COPD (chronic obstructive pulmonary disease) (HCC)   . Diabetes mellitus without complication (HCC)   . Diverticulitis   . Hyperlipidemia   . Hypertension   . Pain October 28, 2013   Left Lower quadrant / back  . Thrombocytopenia (HCC)      FAMILY HISTORY:   Family History  Problem Relation Age of Onset  . Hypertension Mother   . Hyperlipidemia Mother     SOCIAL HISTORY:   Social History  Substance Use Topics  . Smoking status: Current Every Day Smoker    Packs/day: 0.30    Years: 40.00    Types: Cigarettes  . Smokeless tobacco: Never Used     Comment: 2.5-3 cigarettes per day.   . Alcohol use 3.0 - 4.2 oz/week    3 - 4 Glasses of wine, 2 - 3 Standard drinks or equivalent per week     ALLERGIES:   Allergies  Allergen Reactions  . Ace Inhibitors Cough  . Trilipix [Choline Fenofibrate] Other (See Comments)    Muscle cramps, muscle stiffness  .  Zocor [Simvastatin] Other (See Comments)    Myalgia     CURRENT MEDICATIONS:   Current Outpatient Prescriptions  Medication Sig Dispense Refill  . aspirin EC 81 MG tablet Take 81 mg by mouth daily.    . calcium-vitamin D (OSCAL WITH D) 250-125 MG-UNIT per tablet Take 1 tablet by mouth daily.    . cilostazol (PLETAL) 100 MG tablet Take 1 tablet (100 mg total) by mouth 2 (two) times daily before a meal. 60 tablet 11  . ezetimibe (ZETIA) 10 MG tablet     . losartan-hydrochlorothiazide (HYZAAR) 100-25 MG per tablet Take 1 tablet by mouth daily.    . Omega-3 Fatty Acids (FISH OIL BURP-LESS PO) Take by mouth.     No current facility-administered medications for this visit.     REVIEW OF SYSTEMS:   [X]  denotes positive finding, [ ]  denotes negative finding Cardiac  Comments:  Chest pain or chest pressure:    Shortness of breath upon exertion:    Short of breath when lying flat:    Irregular heart rhythm:        Vascular    Pain in calf, thigh, or hip brought on by ambulation: x   Pain in feet at night that wakes you up from your sleep:  Blood clot in your veins:    Leg swelling:         Pulmonary    Oxygen at home:    Productive cough:     Wheezing:         Neurologic    Sudden weakness in arms or legs:     Sudden numbness in arms or legs:     Sudden onset of difficulty speaking or slurred speech:    Temporary loss of vision in one eye:     Problems with dizziness:         Gastrointestinal    Blood in stool:     Vomited blood:         Genitourinary    Burning when urinating:     Blood in urine:        Psychiatric    Major depression:         Hematologic    Bleeding problems:    Problems with blood clotting too easily:        Skin    Rashes or ulcers:        Constitutional    Fever or chills:      PHYSICAL EXAM:   Vitals:   06/16/16 0938  BP: (!) 190/91  Pulse: 86  Resp: 16  Temp: 97.5 F (36.4 C)  TempSrc: Oral  SpO2: 99%  Weight: 130 lb (59  kg)  Height: 5\' 4"  (1.626 m)    GENERAL: The patient is a well-nourished female, in no acute distress. The vital signs are documented above. CARDIAC: There is a regular rate and rhythm.  PULMONARY: Non-labored respirations MUSCULOSKELETAL: There are no major deformities or cyanosis. NEUROLOGIC: No focal weakness or paresthesias are detected. SKIN: There are no ulcers or rashes noted. PSYCHIATRIC: The patient has a normal affect.  STUDIES:   Occluded/stenotic bilateral superficial femoral artery  MEDICAL ISSUES:   Bilateral claudication, left greater than right colon we discussed our options extensively.  I detailed the long-term patency rates of intervention as well as the risk of amputation without amputation.  She really feels that her life is severely limited by her leg pain and wants to proceed with intervention.  I have scheduled her for arteriogram on Tuesday, March 6.  This will be for an aortogram with bilateral runoff.  I will cannulate her right femoral artery with plans for intervention on the left leg if she is a candidate.  I'll today as she did not get any benefit    Durene Cal, MD [facility] [phone] Pager [phone]

## 2016-06-24 ENCOUNTER — Encounter (HOSPITAL_COMMUNITY): Payer: Self-pay | Admitting: *Deleted

## 2016-06-24 ENCOUNTER — Observation Stay (HOSPITAL_COMMUNITY)
Admission: RE | Admit: 2016-06-24 | Discharge: 2016-06-25 | Disposition: A | Discharge status: Home / Self Care | Payer: PPO | Source: Ambulatory Visit | Attending: Surgery | Admitting: Surgery

## 2016-06-24 ENCOUNTER — Encounter (HOSPITAL_COMMUNITY)
Admission: RE | Disposition: A | Discharge status: Home / Self Care | Payer: Self-pay | Source: Ambulatory Visit | Attending: Surgery

## 2016-06-24 ENCOUNTER — Telehealth: Payer: Self-pay | Admitting: Surgery

## 2016-06-24 DIAGNOSIS — E7800 Pure hypercholesterolemia, unspecified: Secondary | ICD-10-CM | POA: Diagnosis not present

## 2016-06-24 DIAGNOSIS — Z7982 Long term (current) use of aspirin: Secondary | ICD-10-CM | POA: Diagnosis not present

## 2016-06-24 DIAGNOSIS — E785 Hyperlipidemia, unspecified: Secondary | ICD-10-CM | POA: Diagnosis not present

## 2016-06-24 DIAGNOSIS — M1990 Unspecified osteoarthritis, unspecified site: Secondary | ICD-10-CM | POA: Diagnosis not present

## 2016-06-24 DIAGNOSIS — I2510 Atherosclerotic heart disease of native coronary artery without angina pectoris: Secondary | ICD-10-CM | POA: Diagnosis not present

## 2016-06-24 DIAGNOSIS — I70212 Atherosclerosis of native arteries of extremities with intermittent claudication, left leg: Secondary | ICD-10-CM | POA: Diagnosis not present

## 2016-06-24 DIAGNOSIS — I70213 Atherosclerosis of native arteries of extremities with intermittent claudication, bilateral legs: Principal | ICD-10-CM | POA: Insufficient documentation

## 2016-06-24 DIAGNOSIS — F17210 Nicotine dependence, cigarettes, uncomplicated: Secondary | ICD-10-CM | POA: Diagnosis not present

## 2016-06-24 DIAGNOSIS — J449 Chronic obstructive pulmonary disease, unspecified: Secondary | ICD-10-CM | POA: Diagnosis not present

## 2016-06-24 DIAGNOSIS — I10 Essential (primary) hypertension: Secondary | ICD-10-CM | POA: Diagnosis not present

## 2016-06-24 DIAGNOSIS — K219 Gastro-esophageal reflux disease without esophagitis: Secondary | ICD-10-CM | POA: Diagnosis not present

## 2016-06-24 DIAGNOSIS — I701 Atherosclerosis of renal artery: Secondary | ICD-10-CM | POA: Diagnosis not present

## 2016-06-24 DIAGNOSIS — E1151 Type 2 diabetes mellitus with diabetic peripheral angiopathy without gangrene: Secondary | ICD-10-CM | POA: Diagnosis not present

## 2016-06-24 DIAGNOSIS — I75022 Atheroembolism of left lower extremity: Secondary | ICD-10-CM | POA: Diagnosis not present

## 2016-06-24 DIAGNOSIS — L7682 Other postprocedural complications of skin and subcutaneous tissue: Secondary | ICD-10-CM | POA: Diagnosis present

## 2016-06-24 HISTORY — DX: Age-related osteoporosis without current pathological fracture: M81.0

## 2016-06-24 HISTORY — DX: Gastro-esophageal reflux disease without esophagitis: K21.9

## 2016-06-24 HISTORY — PX: ABDOMINAL AORTOGRAM W/LOWER EXTREMITY: CATH118223

## 2016-06-24 HISTORY — DX: Vitamin B12 deficiency anemia, unspecified: D51.9

## 2016-06-24 LAB — POCT I-STAT, CHEM 8
BUN: 10 mg/dL (ref 6–20)
CALCIUM ION: 1.13 mmol/L — AB (ref 1.15–1.40)
CHLORIDE: 100 mmol/L — AB (ref 101–111)
Creatinine, Ser: 0.8 mg/dL (ref 0.44–1.00)
GLUCOSE: 96 mg/dL (ref 65–99)
HCT: 40 % (ref 36.0–46.0)
Hemoglobin: 13.6 g/dL (ref 12.0–15.0)
POTASSIUM: 3.4 mmol/L — AB (ref 3.5–5.1)
Sodium: 139 mmol/L (ref 135–145)
TCO2: 28 mmol/L (ref 0–100)

## 2016-06-24 LAB — POCT ACTIVATED CLOTTING TIME
ACTIVATED CLOTTING TIME: 257 s
ACTIVATED CLOTTING TIME: 180 s
Activated Clotting Time: 241 seconds
Activated Clotting Time: 186 seconds

## 2016-06-24 LAB — GLUCOSE, CAPILLARY
GLUCOSE-CAPILLARY: 86 mg/dL (ref 65–99)
Glucose-Capillary: 95 mg/dL (ref 65–99)

## 2016-06-24 SURGERY — ABDOMINAL AORTOGRAM W/LOWER EXTREMITY

## 2016-06-24 MED ORDER — MORPHINE SULFATE (PF) 2 MG/ML IV SOLN
2.0000 mg | INTRAVENOUS | Status: DC | PRN
  Administered 2016-06-24 (×2): 2 mg via INTRAVENOUS
  Administered 2016-06-24: 19:00:00 4 mg via INTRAVENOUS
  Filled 2016-06-24 (×3): qty 2

## 2016-06-24 MED ORDER — SODIUM CHLORIDE 0.9 % IV SOLN: 1.0000 mL/kg/h | INTRAVENOUS | Status: DC

## 2016-06-24 MED ORDER — ONDANSETRON HCL 4 MG/2ML IJ SOLN: 4.0000 mg | Freq: Four times a day (QID) | INTRAMUSCULAR | Status: DC | PRN

## 2016-06-24 MED ORDER — ACETAMINOPHEN 325 MG RE SUPP
325.0000 mg | RECTAL | Status: DC | PRN
Start: 2016-06-24 — End: 2016-06-25
  Filled 2016-06-24: qty 2

## 2016-06-24 MED ORDER — GUAIFENESIN-DM 100-10 MG/5ML PO SYRP: 15.0000 mL | ORAL_SOLUTION | ORAL | Status: DC | PRN

## 2016-06-24 MED ORDER — MIDAZOLAM HCL 2 MG/2ML IJ SOLN
INTRAMUSCULAR | Status: AC
  Filled 2016-06-24: qty 2

## 2016-06-24 MED ORDER — CLOPIDOGREL BISULFATE 75 MG PO TABS: 75.0000 mg | ORAL_TABLET | Freq: Every day | ORAL | 11 refills | Status: DC

## 2016-06-24 MED ORDER — HEPARIN (PORCINE) IN NACL 2-0.9 UNIT/ML-% IJ SOLN
INTRAMUSCULAR | Status: DC | PRN
  Administered 2016-06-24: 1000 mL

## 2016-06-24 MED ORDER — PHENOL 1.4 % MT LIQD: 1.0000 | OROMUCOSAL | Status: DC | PRN

## 2016-06-24 MED ORDER — MORPHINE SULFATE (PF) 10 MG/ML IV SOLN
2.0000 mg | INTRAVENOUS | Status: DC | PRN
  Administered 2016-06-24: 2 mg via INTRAVENOUS

## 2016-06-24 MED ORDER — MORPHINE SULFATE (PF) 4 MG/ML IV SOLN
INTRAVENOUS | Status: AC
Start: 2016-06-24 — End: 2016-06-24
  Filled 2016-06-24: qty 1

## 2016-06-24 MED ORDER — METOPROLOL TARTRATE 5 MG/5ML IV SOLN: 2.0000 mg | INTRAVENOUS | Status: DC | PRN

## 2016-06-24 MED ORDER — OXYCODONE HCL 5 MG PO TABS
ORAL_TABLET | ORAL | Status: AC
  Administered 2016-06-24: 5 mg via ORAL
  Filled 2016-06-24: qty 1

## 2016-06-24 MED ORDER — ALUM & MAG HYDROXIDE-SIMETH 200-200-20 MG/5ML PO SUSP: 15.0000 mL | ORAL | Status: DC | PRN

## 2016-06-24 MED ORDER — HEPARIN SODIUM (PORCINE) 1000 UNIT/ML IJ SOLN
INTRAMUSCULAR | Status: DC | PRN
  Administered 2016-06-24: 1000 [IU] via INTRAVENOUS
  Administered 2016-06-24: 6000 [IU] via INTRAVENOUS

## 2016-06-24 MED ORDER — OXYCODONE HCL 5 MG PO TABS
5.0000 mg | ORAL_TABLET | ORAL | Status: DC | PRN
  Administered 2016-06-24 – 2016-06-25 (×3): 5 mg via ORAL
  Filled 2016-06-24 (×2): qty 1

## 2016-06-24 MED ORDER — HEPARIN (PORCINE) IN NACL 2-0.9 UNIT/ML-% IJ SOLN
INTRAMUSCULAR | Status: AC
  Filled 2016-06-24: qty 1000

## 2016-06-24 MED ORDER — MIDAZOLAM HCL 2 MG/2ML IJ SOLN
INTRAMUSCULAR | Status: DC | PRN
  Administered 2016-06-24 (×3): 1 mg via INTRAVENOUS

## 2016-06-24 MED ORDER — LIDOCAINE HCL (PF) 1 % IJ SOLN
INTRAMUSCULAR | Status: AC
  Filled 2016-06-24: qty 30

## 2016-06-24 MED ORDER — IODIXANOL 320 MG/ML IV SOLN
INTRAVENOUS | Status: DC | PRN
  Administered 2016-06-24: 110 mL via INTRA_ARTERIAL

## 2016-06-24 MED ORDER — HYDRALAZINE HCL 20 MG/ML IJ SOLN: 5.0000 mg | INTRAMUSCULAR | Status: DC | PRN

## 2016-06-24 MED ORDER — HEPARIN SODIUM (PORCINE) 1000 UNIT/ML IJ SOLN
INTRAMUSCULAR | Status: AC
  Filled 2016-06-24: qty 1

## 2016-06-24 MED ORDER — LABETALOL HCL 5 MG/ML IV SOLN: 10.0000 mg | INTRAVENOUS | Status: DC | PRN

## 2016-06-24 MED ORDER — SODIUM CHLORIDE 0.9 % IV SOLN: 500.0000 mL | Freq: Once | INTRAVENOUS | Status: DC | PRN

## 2016-06-24 MED ORDER — DOCUSATE SODIUM 100 MG PO CAPS
100.0000 mg | ORAL_CAPSULE | Freq: Every day | ORAL | Status: DC
  Administered 2016-06-25: 100 mg via ORAL
  Filled 2016-06-24: qty 1

## 2016-06-24 MED ORDER — FENTANYL CITRATE (PF) 100 MCG/2ML IJ SOLN
INTRAMUSCULAR | Status: DC | PRN
  Administered 2016-06-24 (×3): 25 ug via INTRAVENOUS

## 2016-06-24 MED ORDER — SODIUM CHLORIDE 0.9 % IV SOLN
INTRAVENOUS | Status: DC
  Administered 2016-06-24: 06:00:00 via INTRAVENOUS

## 2016-06-24 MED ORDER — ACETAMINOPHEN 325 MG PO TABS: 325.0000 mg | ORAL_TABLET | ORAL | Status: DC | PRN

## 2016-06-24 MED ORDER — FENTANYL CITRATE (PF) 100 MCG/2ML IJ SOLN
INTRAMUSCULAR | Status: AC
  Filled 2016-06-24: qty 2

## 2016-06-24 MED ORDER — LIDOCAINE HCL (PF) 1 % IJ SOLN
INTRAMUSCULAR | Status: DC | PRN
  Administered 2016-06-24: 20 mL via SUBCUTANEOUS

## 2016-06-24 SURGICAL SUPPLY — 34 items
BALLN ARMADA 5X80X135 (BALLOONS) ×3
BALLN LUTONIX DCB 5X100X130 (BALLOONS) ×3
BALLN STERLING OTW 5X80X135 (BALLOONS) ×3
BALLOON ARMADA 5X80X135 (BALLOONS) ×1 IMPLANT
BALLOON LUTONIX DCB 5X100X130 (BALLOONS) ×1 IMPLANT
BALLOON STERLING OTW 5X80X135 (BALLOONS) ×1 IMPLANT
BUR JETSTREAM XC 2.1/3.0 (BURR) ×1 IMPLANT
BURR JETSTREAM XC 2.1/3.0 (BURR) ×2
BURR JETSTREAM XC 2.1MM/3.0MM (BURR) ×1
CATH OMNI FLUSH 5F 65CM (CATHETERS) ×3 IMPLANT
CATH QUICKCROSS .035X135CM (MICROCATHETER) ×3 IMPLANT
COVER PRB 48X5XTLSCP FOLD TPE (BAG) ×1 IMPLANT
COVER PROBE 5X48 (BAG) ×2
DEVICE EMBOSHIELD NAV6 4.0-7.0 (WIRE) ×3 IMPLANT
DEVICE TORQUE H2O (MISCELLANEOUS) ×3 IMPLANT
DRAPE ZERO GRAVITY STERILE (DRAPES) ×3 IMPLANT
GUIDEWIRE ANGLED .035X260CM (WIRE) ×3 IMPLANT
KIT ENCORE 26 ADVANTAGE (KITS) ×3 IMPLANT
KIT PV (KITS) ×3 IMPLANT
LUBRICANT VIPERSLIDE CORONARY (MISCELLANEOUS) ×3 IMPLANT
SHEATH HIGHFLEX ANSEL 7FR 55CM (SHEATH) ×3 IMPLANT
SHEATH PINNACLE 5F 10CM (SHEATH) ×3 IMPLANT
SHIELD RADPAD SCOOP 12X17 (MISCELLANEOUS) ×3 IMPLANT
STENT INNOVA 6X80X130 (Permanent Stent) ×3 IMPLANT
SYR MEDRAD MARK V 150ML (SYRINGE) ×3 IMPLANT
TAPE VIPERTRACK RADIOPAQ (MISCELLANEOUS) ×1 IMPLANT
TAPE VIPERTRACK RADIOPAQUE (MISCELLANEOUS) ×2
TRANSDUCER W/STOPCOCK (MISCELLANEOUS) ×3 IMPLANT
TRAY PV CATH (CUSTOM PROCEDURE TRAY) ×3 IMPLANT
TUBING HIGH PRESSURE 120CM (CONNECTOR) ×3 IMPLANT
WIRE BAREWIRE WORK .014X315CM (WIRE) ×3 IMPLANT
WIRE BENTSON .035X145CM (WIRE) ×3 IMPLANT
WIRE MINI STICK MAX (SHEATH) ×3 IMPLANT
WIRE SPARTACORE .014X300CM (WIRE) ×3 IMPLANT

## 2016-06-24 NOTE — Telephone Encounter (Signed)
-----   Message from Marilyn K McChesney, Sharee PimpleN sent at 06/24/2016  9:43 AM EST ----- Regarding: 3-4 weeks w/ LE arterial   ----- Message ----- From: Nada LibmanVance W Brabham, MD Sent: 06/24/2016   9:31 AM To: Vvs Charge Pool  06/24/2016:  Surgeon:  Durene CalBrabham, Wells Procedure Performed:  1.  Ultrasound-guided access, right femoral artery  2.  Abdominal aortogram  3.  Left lower extremity runoff  4.  Percutaneous mechanical thrombectomy, left superficial femoral/popliteal artery  5.  Atherectomy with drug coated balloon angioplasty, left superficial femoral/popliteal artery  6.  Stent, left popliteal artery  7.  Conscious sedation oh (103 minutes)   Please schedule to follow-up with me in 34 weeks with duplex of the left leg

## 2016-06-24 NOTE — H&P (View-Only) (Signed)
[facility] [place]  Patient name: Carla CraftsKathryn G Conaty MRN: [id number] DOB: [birth year] Sex: female   REASON FOR VISIT:    Follow up claudication  HISOTRY OF PRESENT ILLNESS:    Carla Romero is a 71 y.o. female returns today for follow-up of her claudication.  We initially began a exercise program with cilostazol.  She states that she did not get any benefit from her cilostazol.  She states that her leg pain is actually worse now.  She states that her legs start to hurt sooner.  The patient has a history of hypercholesterolemia.  She has been intolerant of statins.  She is now taking Zetia.  She continues to smoke.  She is medically managed for hypertension and diabetes.  She does take an aspirin.  PAST MEDICAL HISTORY:   Past Medical History:  Diagnosis Date  . Acute viral syndrome   . Anemia    Vit D deficiency  . Arteritis, unspecified November 01, 2013  . Arthritis    Osteoporosis  . CAD (coronary artery disease)   . CAP (community acquired pneumonia)   . COPD (chronic obstructive pulmonary disease) (HCC)   . Diabetes mellitus without complication (HCC)   . Diverticulitis   . Hyperlipidemia   . Hypertension   . Pain October 28, 2013   Left Lower quadrant / back  . Thrombocytopenia (HCC)      FAMILY HISTORY:   Family History  Problem Relation Age of Onset  . Hypertension Mother   . Hyperlipidemia Mother     SOCIAL HISTORY:   Social History  Substance Use Topics  . Smoking status: Current Every Day Smoker    Packs/day: 0.30    Years: 40.00    Types: Cigarettes  . Smokeless tobacco: Never Used     Comment: 2.5-3 cigarettes per day.   . Alcohol use 3.0 - 4.2 oz/week    3 - 4 Glasses of wine, 2 - 3 Standard drinks or equivalent per week     ALLERGIES:   Allergies  Allergen Reactions  . Ace Inhibitors Cough  . Trilipix [Choline Fenofibrate] Other (See Comments)    Muscle cramps, muscle stiffness  .  Zocor [Simvastatin] Other (See Comments)    Myalgia     CURRENT MEDICATIONS:   Current Outpatient Prescriptions  Medication Sig Dispense Refill  . aspirin EC 81 MG tablet Take 81 mg by mouth daily.    . calcium-vitamin D (OSCAL WITH D) 250-125 MG-UNIT per tablet Take 1 tablet by mouth daily.    . cilostazol (PLETAL) 100 MG tablet Take 1 tablet (100 mg total) by mouth 2 (two) times daily before a meal. 60 tablet 11  . ezetimibe (ZETIA) 10 MG tablet     . losartan-hydrochlorothiazide (HYZAAR) 100-25 MG per tablet Take 1 tablet by mouth daily.    . Omega-3 Fatty Acids (FISH OIL BURP-LESS PO) Take by mouth.     No current facility-administered medications for this visit.     REVIEW OF SYSTEMS:   [X]  denotes positive finding, [ ]  denotes negative finding Cardiac  Comments:  Chest pain or chest pressure:    Shortness of breath upon exertion:    Short of breath when lying flat:    Irregular heart rhythm:        Vascular    Pain in calf, thigh, or hip brought on by ambulation: x   Pain in feet at night that wakes you up from your sleep:  Blood clot in your veins:    Leg swelling:         Pulmonary    Oxygen at home:    Productive cough:     Wheezing:         Neurologic    Sudden weakness in arms or legs:     Sudden numbness in arms or legs:     Sudden onset of difficulty speaking or slurred speech:    Temporary loss of vision in one eye:     Problems with dizziness:         Gastrointestinal    Blood in stool:     Vomited blood:         Genitourinary    Burning when urinating:     Blood in urine:        Psychiatric    Major depression:         Hematologic    Bleeding problems:    Problems with blood clotting too easily:        Skin    Rashes or ulcers:        Constitutional    Fever or chills:      PHYSICAL EXAM:   Vitals:   06/16/16 0938  BP: (!) 190/91  Pulse: 86  Resp: 16  Temp: 97.5 F (36.4 C)  TempSrc: Oral  SpO2: 99%  Weight: 130 lb (59  kg)  Height: 5\' 4"  (1.626 m)    GENERAL: The patient is a well-nourished female, in no acute distress. The vital signs are documented above. CARDIAC: There is a regular rate and rhythm.  PULMONARY: Non-labored respirations MUSCULOSKELETAL: There are no major deformities or cyanosis. NEUROLOGIC: No focal weakness or paresthesias are detected. SKIN: There are no ulcers or rashes noted. PSYCHIATRIC: The patient has a normal affect.  STUDIES:   Occluded/stenotic bilateral superficial femoral artery  MEDICAL ISSUES:   Bilateral claudication, left greater than right colon we discussed our options extensively.  I detailed the long-term patency rates of intervention as well as the risk of amputation without amputation.  She really feels that her life is severely limited by her leg pain and wants to proceed with intervention.  I have scheduled her for arteriogram on Tuesday, March 6.  This will be for an aortogram with bilateral runoff.  I will cannulate her right femoral artery with plans for intervention on the left leg if she is a candidate.  I'll today as she did not get any benefit    Durene Cal, MD [facility] [phone] Pager [phone]

## 2016-06-24 NOTE — Op Note (Signed)
Patient name: Carla Romero MRN: [id number] DOB: 07/23/1945 Sex: female  06/24/2016 Pre-operative Diagnosis: Leg claudication Post-operative diagnosis:  Same Surgeon:  Annamarie Major Procedure Performed:  1.  Ultrasound-guided access, right femoral artery  2.  Abdominal aortogram  3.  Left lower extremity runoff  4.  Percutaneous mechanical thrombectomy, left superficial femoral/popliteal artery  5.  Atherectomy with drug coated balloon angioplasty, left superficial femoral/popliteal artery  6.  Stent, left popliteal artery  7.  Conscious sedation oh (103 minutes)   Indications:  The patient suffers from lifestyle limiting claudication of the left leg.  We have attempted a trial of conservative therapy which the patient had a worsening of her symptoms.  She comes in today for further evaluation and possible intervention  Procedure:  The patient was identified in the holding area and taken to room 8.  The pati conscious sedation was performed with the use of IV fentanyl and Versed in a continuous physician and nurse monitoring.  Heart rate, blood pressure, and oxygen saturations were continuously monitored.  ent was then placed supine on the table and prepped and draped in the usual sterile fashion.  A time out was called.  Ultrasound was used to evaluate the right common femoral artery.  It was patent .  A digital ultrasound image was acquired.  A micropuncture needle was used to access the right common femoral artery under ultrasound guidance.  An 018 wire was advanced without resistance and a micropuncture sheath was placed.  The 018 wire was removed and a benson wire was placed.  The micropuncture sheath was exchanged for a 5 french sheath.  An omniflush catheter was advanced over the wire to the level of L-1.  An abdominal angiogram was obtained.  Next, using the omniflush catheter and a benson wire, the aortic bifurcation was crossed and the catheter was placed into theleft external iliac  artery and left runoff was obtained.    Findings:   Aortogram:  Greater than 60% left renal artery stenosis.  The infrarenal abdominal aorta is patent throughout it's course.  Bilateral common and external iliac arteries are patent without significant stenosis  Right Lower Extremity:  Not evaluated today  Left Lower Extremity:  The left common femoral profunda femoral artery are patent throughout their course.  The superficial femoral artery is patent proximally however occludes in the mid thigh for a distance of approximately 6 cm.  There is reconstitution of the popliteal artery with mild stenosis at the patella.  The below knee popliteal artery is patent with two-vessel runoff via the anterior tibial and peroneal artery  Intervention:  After the above images were acquired the decision was made to proceed with intervention.  A 7 French 55 cm sheath was advanced into the left superficial femoral artery.  The patient was fully heparinized.  I used a Glidewire and a quick cross to cross the lesion.  The lesion appeared to be very soft and with the patient's recent change in symptoms I was concerned that there was at least subacute thrombus here.  Once I got a cross the lesion/occlusion, I performed angiography through a quick cross catheter in the popliteal artery confirming that I was back in the true lumen.  I placed a bare wire, and then a large NAV-6 filter.  I then selected the small jetstream device and used this to perform mechanical thrombectomy of the presumed acute/subacute thrombus within the left superficial femoral artery.  This was also done to perform atherectomy of  this area.  Using Blaydes down, I made a full passed down and back very slow so that I could perform thrombectomy and atherectomy simultaneously.  Once this was done an arteriogram was performed which showed significant improvement and in line flow.  I then selected a drug-coated 5 x 100 Lutonix balloon to perform balloon angioplasty  of the treated segments of the superficial femoral and popliteal artery.  The balloon was taken to nominal pressure for 2 minutes.  Completion imaging study revealed that there was a dissection proximally.  This did appear to be somewhat non-flow-limiting, however I was uncomfortable leaving this and so I selected a 5 x 100 Abbott balloon and performed a long inflation for 3 minutes at profile.  Completion imaging showed that the dissection persisted.  Because the dissection was then the previously occluded area felt it needed to be stented.  I selected a Chemical engineer 6 x 80 INOVA stent and deployed it in this area.  It was molded to confirmation with a 5 mm balloon.  Follow-up imaging revealed resolution of the dissection.  There was still an area that I had not treated down by the patella that has progressively looked worse.  I ended up removing the filter to get a better image of this area.  I still was not satisfied with the way this looked and so I inserted a [place]a core wire and then selected a 5 mm balloon and placed this across the popliteal artery at the level of the patella.  This was taken to profile which was 4 atm and held for 2 minutes and 30 seconds.  Completion imaging revealed significantly improved results.  Runoff was also evaluated which was unchanged from pre-intervention.  At this point the sheath was withdrawn to the right external iliac artery.  The patient be taken the holding area for sheath pull once the coagulation profile corrects.   Impression:  #1  left superficial femoral artery acute/subacute occlusion.  A jetstream device was used for thrombectomy and atherectomy of this area, followed by drug coated balloon angioplasty with a 5 mm balloon.  There was a residual dissection that didn't not respond to a prolonged inflation and therefore this area had to be treated with a INOVA 6 x 80 self-expanding stent.  Residual stenosis less than 10%  #2  greater than 60% left renal  artery stenosis    V. Annamarie Major, M.D. [facility] [place] Office: [phone] Pager:  [phone]

## 2016-06-24 NOTE — Progress Notes (Signed)
S/p angio today for claudication Kept overnight because of bleeding after sheath removal  She has a palpable AT on the left Right groin soft but tender with ecchymosis  Bed rest up in 30 minutes at 10 pm  Plan for d/c in am if no further bleeding Has Rx for Plavix on chart Will f/u in 3-4 weeks with me with u/s (already scheduled)   Durene CalWells Kollyns Mickelson

## 2016-06-24 NOTE — Progress Notes (Signed)
7 french sheath removed from right femoral artery and pressure held x 40 minutes.  Slight vagal reaction with pressure dropping into the 60-70's systolic over a 2-3 minute period and HR dropped to mid 40's.  200cc of NS IV fluid given and pressure returned to 132/56 and HR into low 50's.  There is a small hematoma at the site of the sheath removal which appears to be stable and marked.  I would describe this as a level 1 hematoma.  Distal pulses present by doppler.  Site dressed with tegaderm and 2x2.  RN will continue to monitor site.  Bed rest instructions given to patient.

## 2016-06-24 NOTE — Telephone Encounter (Signed)
LVM on home # for US on 4/4 and PO on 4/9  Mailed lttr as well

## 2016-06-24 NOTE — Progress Notes (Signed)
Pt c/o pain at (R) groin area. Slight increase in edges of marked hematoma. Feels firm at the lateral thigh hip area. Pressure held x15 min. Cath lab holding area notified and Richardson DoppLaura Murphy,RN came over and took over pressure. Pressure dsg applied. Dr Myra GianottiBrabham notified. 23hr obs orders placed for pt on 6c

## 2016-06-24 NOTE — Interval H&P Note (Signed)
History and Physical Interval Note:  06/24/2016 7:14 AM  Carla Romero  has presented today for surgery, with the diagnosis of pvd w/ bilateral lower claudication  The various methods of treatment have been discussed with the patient and family. After consideration of risks, benefits and other options for treatment, the patient has consented to  Procedure(s): Abdominal Aortogram w/Lower Extremity (N/A) as a surgical intervention .  The patient's history has been reviewed, patient examined, no change in status, stable for surgery.  I have reviewed the patient's chart and labs.  Questions were answered to the patient's satisfaction.     Durene CalBrabham, Wells

## 2016-06-24 NOTE — Care Management Note (Signed)
Case Management Note  Patient Details  Name: Carla Romero MRN: [id number] Date of Birth: 16-Jun-1945  Subjective/Objective:   S/p pv stent intervention, NCM will cont to follow for dc needs.                 Action/Plan:   Expected Discharge Date:  06/24/16               Expected Discharge Plan:  Home/Self Care  In-House Referral:     Discharge planning Services  CM Consult  Post Acute Care Choice:    Choice offered to:     DME Arranged:    DME Agency:     HH Arranged:    HH Agency:     Status of Service:  In process, will continue to follow  If discussed at Long Length of Stay Meetings, dates discussed:    Additional Comments:  [name], [name], RN 06/24/2016, 7:50 PM

## 2016-06-24 NOTE — Progress Notes (Signed)
Patient received from short stay at 1715 with level 2 hematoma to right femoral site.  Margin marked but not fully visible under pressure bandage.  Site monitored closely through 1900 with no changes noted.  Patient medicated successfully for severe pain.

## 2016-06-24 NOTE — Discharge Instructions (Signed)

## 2016-06-24 NOTE — Progress Notes (Signed)
Bed received on 6c 6 Report called to Jim,RN Pt and family kept informed

## 2016-06-25 ENCOUNTER — Encounter (HOSPITAL_COMMUNITY): Payer: Self-pay | Admitting: Surgery

## 2016-06-25 DIAGNOSIS — I70213 Atherosclerosis of native arteries of extremities with intermittent claudication, bilateral legs: Secondary | ICD-10-CM | POA: Diagnosis not present

## 2016-06-25 MED ORDER — ANGIOPLASTY BOOK
Freq: Once | Status: DC
  Filled 2016-06-25: qty 1

## 2016-06-25 MED FILL — Morphine Sulfate Inj 4 MG/ML: INTRAMUSCULAR | Qty: 0.5 | Status: AC

## 2016-06-25 NOTE — Progress Notes (Addendum)
[facility] [place]  Subjective  - Doing well.  Ambulated, voided, and tolerating PO's.   Objective (!) 139/59 68 98.3 F (36.8 C) (Oral) 18 95%  Intake/Output Summary (Last 24 hours) at 06/25/16 1119 Last data filed at 06/25/16 0600  Gross per 24 hour  Intake           878.25 ml  Output              200 ml  Net           678.25 ml    Palpable Dp Bilaterally Right groin soft without frank hematoma, ecchymosis present Lungs non labored breathing Heart RRR  Assessment/Planning: POD # 1 Procedure Performed:             1.  Ultrasound-guided access, right femoral artery             2.  Abdominal aortogram             3.  Left lower extremity runoff             4.  Percutaneous mechanical thrombectomy, left superficial femoral/popliteal artery             5.  Atherectomy with drug coated balloon angioplasty, left superficial femoral/popliteal artery             6.  Stent, left popliteal artery             7.  Conscious sedation oh (103 minutes)  Findings:              Aortogram:  Greater than 60% left renal artery stenosis.  The infrarenal abdominal aorta is patent throughout it's course.  Bilateral common and external iliac arteries are patent without significant stenosis             Right Lower Extremity:  Not evaluated today             Left Lower Extremity:  The left common femoral profunda femoral artery are patent throughout their course.  The superficial femoral artery is patent proximally however occludes in the mid thigh for a distance of approximately 6 cm.  There is reconstitution of the popliteal artery with mild stenosis at the patella.  The below knee popliteal artery is patent with two-vessel runoff via the anterior tibial and peroneal artery  Impression:             #1  left superficial femoral artery acute/subacute occlusion.  A jetstream device was used for thrombectomy and atherectomy of this area, followed by drug coated balloon  angioplasty with a 5 mm balloon.  There was a residual dissection that didn't not respond to a prolonged inflation and therefore this area had to be treated with a INOVA 6 x 80 self-expanding stent.  Residual stenosis less than 10%             #2  greater than 60% left renal artery stenosis  Stable for discharge home.  Plavix prescription sent to her pharmacy by Dr. Myra GianottiBrabham. F/U in our office in 2-3 weeks.  Clinton GallantCOLLINS, Brietta Manso [facility] LLPMAUREEN 06/25/2016 11:19 AM --  Laboratory Lab Results:  Recent Labs  06/24/16 0650  HGB 13.6  HCT 40.0   BMET  Recent Labs  06/24/16 0650  NA 139  K 3.4*  CL 100*  GLUCOSE 96  BUN 10  CREATININE 0.80    COAG No results found for: INR, PROTIME No results found for: PTT

## 2016-06-25 NOTE — Care Management Note (Signed)
Case Management Note  Patient Details  Name: Rocky CraftsKathryn G Markham MRN: [id number] Date of Birth: 1945-08-07  Subjective/Objective:   S/p pv stent intervention, will be on plavix and asa.  For possible dc today.                  Action/Plan:   Expected Discharge Date:  06/24/16               Expected Discharge Plan:  Home/Self Care  In-House Referral:     Discharge planning Services  CM Consult  Post Acute Care Choice:    Choice offered to:     DME Arranged:    DME Agency:     HH Arranged:    HH Agency:     Status of Service:  Completed, signed off  If discussed at MicrosoftLong Length of Stay Meetings, dates discussed:    Additional Comments:  [name], Orian Amberg Clinton, RN 06/25/2016, 9:07 AM

## 2016-06-26 NOTE — Discharge Summary (Signed)
[facility] Discharge Summary   Patient ID:  Carla Romero MRN: [id number] DOB/AGE: 28-Mar-1946 71 y.o.  Admit date: 06/24/2016 Discharge date: 06/25/2016 Date of Surgery: 06/24/2016 Surgeon: Surgeon(s): Nada Libman, MD  Admission Diagnosis: Bleeding at insertion site [L76.82]  Discharge Diagnoses:  Bleeding at insertion site [L76.82]  Secondary Diagnoses: Past Medical History:  Diagnosis Date  . Acute viral syndrome   . Anemia    Vit D deficiency  . Arteritis, unspecified November 01, 2013  . Arthritis    "fingers, knees, upper spine" (06/24/2016)  . B12 deficiency anemia    "had injections in the 1970s"  . CAD (coronary artery disease)   . COPD (chronic obstructive pulmonary disease) (HCC)    "no one has ever told me I have this" (06/24/2016)  . Diverticulitis   . GERD (gastroesophageal reflux disease)   . Hyperlipidemia   . Hypertension   . Osteoporosis   . Pain October 28, 2013   Left Lower quadrant / back  . Thrombocytopenia (HCC)     Procedure(s): Abdominal Aortogram w/Lower Extremity  Discharged Condition: good  HPI: Carla Romero is a 71 y.o. female returns today for follow-up of her claudication.  We initially began a exercise program with cilostazol.  She states that she did not get any benefit from her cilostazol.  She states that her leg pain is actually worse now.  She states that her legs start to hurt sooner.  The patient has a history of hypercholesterolemia. She has been intolerant of statins. She is now taking Zetia. She continues to smoke. She is medically managed for hypertension and diabetes. She does take an aspirin.   Hospital Course:  Carla Romero is a 71 y.o. female is S/P  Procedure(s): Abdominal Aortogram w/Lower Extremity    1.  Ultrasound-guided access, right femoral artery             2.  Abdominal aortogram             3.  Left lower extremity runoff             4.  Percutaneous mechanical thrombectomy,  left superficial femoral/popliteal artery             5.  Atherectomy with drug coated balloon angioplasty, left superficial femoral/popliteal artery             6.  Stent, left popliteal artery  POD# 1  She has a palpable AT on the left Right groin soft but tender with ecchymosis    Findings:  Aortogram: Greater than 60% left renal artery stenosis. The infrarenal abdominal aorta is patent throughout it's course. Bilateral common and external iliac arteries are patent without significant stenosis Right Lower Extremity: Not evaluated today Left Lower Extremity:The left common femoral profunda femoral artery are patent throughout their course. The superficial femoral artery is patent proximally however occludes in the mid thigh for a distance of approximately 6 cm. There is reconstitution of the popliteal artery with mild stenosis at the patella. The below knee popliteal artery is patent with two-vessel runoff via the anterior tibial and peroneal artery  Impression: #1 left superficial femoral artery acute/subacute occlusion. A jetstream device was used for thrombectomy and atherectomy of this area, followed by drug coated balloon angioplasty with a 5 mm balloon. There was a residual dissection that didn't not respond to a prolonged inflation and therefore this area had to be treated with a INOVA6 x 80 self-expanding stent. Residual stenosis less  than 10% #2 greater than 60% left renal artery stenosis  Stable for discharge home.  Plavix prescription sent to her pharmacy by Dr. Myra Gianotti. F/U in our office in 2-3 weeks.  Significant Diagnostic Studies: CBC Lab Results  Component Value Date   HGB 13.6 06/24/2016   HCT 40.0 06/24/2016    BMET    Component Value Date/Time   NA 139 06/24/2016 0650   K 3.4 (L) 06/24/2016 0650   CL 100 (L) 06/24/2016 0650   GLUCOSE 96 06/24/2016 0650   BUN 10 06/24/2016 0650    CREATININE 0.80 06/24/2016 0650   CREATININE 1.00 01/30/2014 0945   COAG No results found for: INR, PROTIME   Disposition:  Discharge to :Home Discharge Instructions    Call MD for:  redness, tenderness, or signs of infection (pain, swelling, bleeding, redness, odor or green/yellow discharge around incision site)    Complete by:  As directed    Call MD for:  severe or increased pain, loss or decreased feeling  in affected limb(s)    Complete by:  As directed    Call MD for:  temperature >100.5    Complete by:  As directed    Discharge instructions    Complete by:  As directed    You may shower daily.  Gradually get into your regular routine of activity.   Driving Restrictions    Complete by:  As directed    No driving for 2-3 days   Increase activity slowly    Complete by:  As directed    Walk with assistance use walker or cane as needed   Lifting restrictions    Complete by:  As directed    No heavy lifting for 3-4 weeks   Resume previous diet    Complete by:  As directed      Allergies as of 06/25/2016      Reactions   Ace Inhibitors Cough   Trilipix [choline Fenofibrate] Other (See Comments)   Muscle cramps, muscle stiffness   Zocor [simvastatin] Other (See Comments)   Myalgia      Medication List    STOP taking these medications   cilostazol 100 MG tablet Commonly known as:  PLETAL     TAKE these medications   alum & mag hydroxide-simeth 200-200-20 MG/5ML suspension Commonly known as:  MAALOX/MYLANTA Take 30 mLs by mouth 2 (two) times daily as needed for indigestion or heartburn.   ASPERCREME LIDOCAINE 4 % Ptch Generic drug:  Lidocaine Apply 1 patch topically daily as needed (pain).   aspirin EC 81 MG tablet Take 81 mg by mouth daily.   CALCIUM + D PO Take 1 tablet by mouth 3 (three) times a week.   clopidogrel 75 MG tablet Commonly known as:  PLAVIX Take 1 tablet (75 mg total) by mouth daily.   diphenhydrAMINE 25 MG tablet Commonly known as:   BENADRYL Take 25 mg by mouth at bedtime as needed for allergies.   ezetimibe 10 MG tablet Commonly known as:  ZETIA Take 10 mg by mouth at bedtime.   Fish Oil 1200 MG Caps Take 1,200 mg by mouth daily.   ibuprofen 200 MG tablet Commonly known as:  ADVIL,MOTRIN Take 400 mg by mouth 2 (two) times daily as needed for headache or moderate pain.   losartan-hydrochlorothiazide 100-25 MG tablet Commonly known as:  HYZAAR Take 1 tablet by mouth daily.   oxymetazoline 0.05 % nasal spray Commonly known as:  AFRIN Place 1 spray into both nostrils 2 (  two) times daily as needed for congestion.   SOOTHE XP OP Apply 1 drop to eye daily as needed (dry eyes).   SUDAFED PE SINUS HEADACHE MAX PO Take 1 tablet by mouth 2 (two) times daily as needed (allergies).   trolamine salicylate 10 % cream Commonly known as:  ASPERCREME Apply 1 application topically as needed for muscle pain.   Vitamin D3 1000 units Caps Take 1,000 Units by mouth daily.      Verbal and written Discharge instructions given to the patient. Wound care per Discharge AVS Follow-up Information    Durene CalBrabham, [place], MD Follow up in 2 week(s).   Specialties:  Vascular Surgery, Cardiology Why:  office will call Contact information: [address] [place] [address] [phone]           Signed: Clinton GallantCOLLINS, Eudell Mcphee [facility]EN 06/26/2016, 8:53 AM

## 2016-06-30 ENCOUNTER — Telehealth: Payer: Self-pay

## 2016-06-30 NOTE — Telephone Encounter (Signed)
Pt called in with sx's of swelling and small hard spot in R groin, close to incision for aortogram. Pt stated that the area was just a a little achy when touched. Pt denied fever, pain, and chills and any signs of infection. Pt also stated that she thinks she might have over reacted. Advised warm compresses to the area and if she began to have pain or swelling or any signs of infection to give our office a call. The pt agreed with the plan and verbalized understanding.

## 2016-07-15 NOTE — Addendum Note (Signed)
Addended by: Burton ApleyPETTY, Lennie Dunnigan A on: 07/15/2016 09:17 AM   Modules accepted: Orders

## 2016-07-16 ENCOUNTER — Encounter: Payer: Self-pay | Admitting: Surgery

## 2016-07-21 DIAGNOSIS — H25813 Combined forms of age-related cataract, bilateral: Secondary | ICD-10-CM | POA: Diagnosis not present

## 2016-07-23 ENCOUNTER — Ambulatory Visit (HOSPITAL_COMMUNITY)
Admission: RE | Admit: 2016-07-23 | Discharge: 2016-07-23 | Disposition: A | Discharge status: Home / Self Care | Payer: PPO | Source: Ambulatory Visit | Attending: Vascular Surgery | Admitting: Vascular Surgery

## 2016-07-23 DIAGNOSIS — Z95820 Peripheral vascular angioplasty status with implants and grafts: Secondary | ICD-10-CM | POA: Insufficient documentation

## 2016-07-23 DIAGNOSIS — I70213 Atherosclerosis of native arteries of extremities with intermittent claudication, bilateral legs: Secondary | ICD-10-CM | POA: Diagnosis not present

## 2016-07-23 LAB — VAS US LOWER EXTREMITY ARTERIAL DUPLEX
LPERODISTSYS: 81 cm/s
LSFPPSV: -185 cm/s
Left ant tibial distal sys: 53 cm/s
Left popliteal dist sys PSV: -100 cm/s
Left super femoral mid sys PSV: -138 cm/s
left post tibial dist sys: 52 cm/s

## 2016-07-28 ENCOUNTER — Ambulatory Visit (INDEPENDENT_AMBULATORY_CARE_PROVIDER_SITE_OTHER): Payer: PPO | Admitting: Surgery

## 2016-07-28 ENCOUNTER — Encounter: Payer: Self-pay | Admitting: Surgery

## 2016-07-28 VITALS — BP 154/76 | HR 71 | Temp 97.2°F | Resp 18 | Ht 64.0 in | Wt 128.6 lb

## 2016-07-28 DIAGNOSIS — I70212 Atherosclerosis of native arteries of extremities with intermittent claudication, left leg: Secondary | ICD-10-CM

## 2016-07-28 NOTE — Progress Notes (Signed)
[facility] of [place]  Patient name: Carla Romero MRN: [id number] DOB: 03-07-1946 Sex: female   REASON FOR VISIT:    Follow up claudication  HISOTRY OF PRESENT ILLNESS:    Carla Romero is a 71 y.o. female who I have been following for claudication since 2017.  We have tried noninvasive measures such as medications and exercise programs, however these have not been successful, and the patient desired pursuing.,  On 06/24/2016 she underwent angiography which revealed a left superficial femoral artery acute/subacute occlusion.  A jet stream device was used for thrombectomy and atherectomy of this area, followed by drug coated balloon angioplasty using a 5 mm balloon.  There was residual dissection that did not respond to prolonged balloon inflation, and therefore this was treated with a 6 x 80 self-expanding stent.  She is back today with no complaints.  She is not having any significant walking limitations with her left leg.  Her right leg does not bother her that much although she does get occasional numbness with activity.  She does not feel this is lifestyle limiting.   PAST MEDICAL HISTORY:   Past Medical History:  Diagnosis Date  . Acute viral syndrome   . Anemia    Vit D deficiency  . Arteritis, unspecified November 01, 2013  . Arthritis    "fingers, knees, upper spine" (06/24/2016)  . B12 deficiency anemia    "had injections in the 1970s"  . CAD (coronary artery disease)   . COPD (chronic obstructive pulmonary disease) (HCC)    "no one has ever told me I have this" (06/24/2016)  . Diverticulitis   . GERD (gastroesophageal reflux disease)   . Hyperlipidemia   . Hypertension   . Osteoporosis   . Pain October 28, 2013   Left Lower quadrant / back  . Thrombocytopenia (HCC)      FAMILY HISTORY:   Family History  Problem Relation Age of Onset  . Hypertension Mother   . Hyperlipidemia Mother     SOCIAL HISTORY:     Social History  Substance Use Topics  . Smoking status: Current Every Day Smoker    Packs/day: 0.12    Years: 43.00    Types: Cigarettes  . Smokeless tobacco: Never Used  . Alcohol use 1.2 - 1.8 oz/week    2 - 3 Standard drinks or equivalent per week     Comment: 06/24/2016 "might have a few drinks/year"     ALLERGIES:   Allergies  Allergen Reactions  . Ace Inhibitors Cough  . Trilipix [Choline Fenofibrate] Other (See Comments)    Muscle cramps, muscle stiffness  . Zocor [Simvastatin] Other (See Comments)    Myalgia     CURRENT MEDICATIONS:   Current Outpatient Prescriptions  Medication Sig Dispense Refill  . alum & mag hydroxide-simeth (MAALOX/MYLANTA) 200-200-20 MG/5ML suspension Take 30 mLs by mouth 2 (two) times daily as needed for indigestion or heartburn.    . Artificial Tear Solution (SOOTHE XP OP) Apply 1 drop to eye daily as needed (dry eyes).    [name] aspirin EC 81 MG tablet Take 81 mg by mouth daily.    . Calcium Citrate-Vitamin D (CALCIUM + D PO) Take 1 tablet by mouth 3 (three) times a week.    . Cholecalciferol (VITAMIN D3) 1000 units CAPS Take 1,000 Units by mouth daily.    . clopidogrel (PLAVIX) 75 MG tablet Take 1 tablet (75 mg total) by mouth daily. 30 tablet 11  . diphenhydrAMINE (BENADRYL)  25 MG tablet Take 25 mg by mouth at bedtime as needed for allergies.    [name] ezetimibe (ZETIA) 10 MG tablet Take 10 mg by mouth at bedtime.     [name] ibuprofen (ADVIL,MOTRIN) 200 MG tablet Take 400 mg by mouth 2 (two) times daily as needed for headache or moderate pain.    . Lidocaine (ASPERCREME LIDOCAINE) 4 % PTCH Apply 1 patch topically daily as needed (pain).    [name] losartan-hydrochlorothiazide (HYZAAR) 100-25 MG per tablet Take 1 tablet by mouth daily.    . Omega-3 Fatty Acids (FISH OIL) 1200 MG CAPS Take 1,200 mg by mouth daily.    [name] oxymetazoline (AFRIN) 0.05 % nasal spray Place 1 spray into both nostrils 2 (two) times daily as needed for congestion.    [name]  Phenylephrine-Acetaminophen (SUDAFED PE SINUS HEADACHE MAX PO) Take 1 tablet by mouth 2 (two) times daily as needed (allergies).    . trolamine salicylate (ASPERCREME) 10 % cream Apply 1 application topically as needed for muscle pain.     No current facility-administered medications for this visit.     REVIEW OF SYSTEMS:    denotes positive finding,  denotes negative finding Cardiac  Comments:  Chest pain or chest pressure:    Shortness of breath upon exertion:    Short of breath when lying flat:    Irregular heart rhythm:        Vascular    Pain in calf, thigh, or hip brought on by ambulation:    Pain in feet at night that wakes you up from your sleep:     Blood clot in your veins:    Leg swelling:         Pulmonary    Oxygen at home:    Productive cough:     Wheezing:         Neurologic    Sudden weakness in arms or legs:     Sudden numbness in arms or legs:     Sudden onset of difficulty speaking or slurred speech:    Temporary loss of vision in one eye:     Problems with dizziness:         Gastrointestinal    Blood in stool:     Vomited blood:         Genitourinary    Burning when urinating:     Blood in urine:        Psychiatric    Major depression:         Hematologic    Bleeding problems:    Problems with blood clotting too easily:        Skin    Rashes or ulcers:        Constitutional    Fever or chills:      PHYSICAL EXAM:   Vitals:   07/28/16 1048 07/28/16 1050  BP: (!) 175/77 (!) 154/76  Pulse: 71   Resp: 18   Temp: 97.2 F (36.2 C)   TempSrc: Oral   SpO2: 97%   Weight: 128 lb 9.6 oz (58.3 kg)   Height:  (1.626 m)     GENERAL: The patient is a well-nourished female, in no acute distress. The vital signs are documented above. CARDIAC: There is a regular rate and rhythm.  VASCULAR: Palpable left dorsalis pedis pulse PULMONARY: Non-labored respirations MUSCULOSKELETAL: There are no major deformities or  cyanosis. NEUROLOGIC: No focal weakness or paresthesias are detected. SKIN: There are no ulcers or rashes noted. PSYCHIATRIC:  The patient has a normal affect.  STUDIES:   Duplex of the left leg was performed which I have reviewed.  This shows triphasic waveforms throughout without any significant stenosis  MEDICAL ISSUES:   Bilateral claudication, left greater than right:  The patient has undergone successful revascularization of the left leg.  Her symptoms of the left leg have completely resolved.  She does not feel that the right leg symptoms warrant further attention at this time.  She will be placed on surveillance protocol.  I told her that I would like for her to continue taking her aspirin and Plavix for at least 3 months.  If she has difficulty tolerating the Plavix, she could stop it at that time.  If she is not having difficulty, I would recommend continuing her Plavix in addition to her aspirin.    Durene Cal, MD [facility] of [facility] [phone] Pager [phone]

## 2016-07-29 NOTE — Addendum Note (Signed)
Addended by: Cody Oliger A on: 07/29/2016 09:53 AM   Modules accepted: Orders  

## 2016-09-20 DIAGNOSIS — R51 Headache: Secondary | ICD-10-CM | POA: Diagnosis not present

## 2016-09-20 DIAGNOSIS — I10 Essential (primary) hypertension: Secondary | ICD-10-CM | POA: Diagnosis not present

## 2016-09-26 DIAGNOSIS — E559 Vitamin D deficiency, unspecified: Secondary | ICD-10-CM | POA: Diagnosis not present

## 2016-09-26 DIAGNOSIS — E782 Mixed hyperlipidemia: Secondary | ICD-10-CM | POA: Diagnosis not present

## 2016-09-26 DIAGNOSIS — I70212 Atherosclerosis of native arteries of extremities with intermittent claudication, left leg: Secondary | ICD-10-CM | POA: Diagnosis not present

## 2016-09-26 DIAGNOSIS — I10 Essential (primary) hypertension: Secondary | ICD-10-CM | POA: Diagnosis not present

## 2016-10-20 ENCOUNTER — Encounter: Payer: Self-pay | Admitting: Family

## 2016-10-27 ENCOUNTER — Ambulatory Visit (INDEPENDENT_AMBULATORY_CARE_PROVIDER_SITE_OTHER): Payer: PPO | Admitting: Family

## 2016-10-27 ENCOUNTER — Encounter: Payer: Self-pay | Admitting: Family

## 2016-10-27 ENCOUNTER — Ambulatory Visit (HOSPITAL_COMMUNITY)
Admission: RE | Admit: 2016-10-27 | Discharge: 2016-10-27 | Disposition: A | Discharge status: Home / Self Care | Payer: PPO | Source: Ambulatory Visit | Attending: Surgery | Admitting: Surgery

## 2016-10-27 VITALS — BP 166/68 | HR 71 | Temp 97.8°F | Resp 16 | Ht 64.0 in | Wt 129.8 lb

## 2016-10-27 DIAGNOSIS — I10 Essential (primary) hypertension: Secondary | ICD-10-CM | POA: Insufficient documentation

## 2016-10-27 DIAGNOSIS — E785 Hyperlipidemia, unspecified: Secondary | ICD-10-CM | POA: Diagnosis not present

## 2016-10-27 DIAGNOSIS — I70213 Atherosclerosis of native arteries of extremities with intermittent claudication, bilateral legs: Secondary | ICD-10-CM | POA: Diagnosis not present

## 2016-10-27 DIAGNOSIS — Z95820 Peripheral vascular angioplasty status with implants and grafts: Secondary | ICD-10-CM | POA: Insufficient documentation

## 2016-10-27 DIAGNOSIS — F17200 Nicotine dependence, unspecified, uncomplicated: Secondary | ICD-10-CM | POA: Diagnosis not present

## 2016-10-27 DIAGNOSIS — I70212 Atherosclerosis of native arteries of extremities with intermittent claudication, left leg: Secondary | ICD-10-CM

## 2016-10-27 NOTE — Progress Notes (Signed)
[facility] OF [place]   CC: Follow up peripheral artery occlusive disease/Intermittent Claudication  History of Present Illness Carla Romero is a 71 y.o. female who Dr. Myra Gianotti has been following for claudication since 2017.  We have tried noninvasive measures such as medications and exercise programs, however these have not been successful, and the patient desired pursuing arteriogram with intervention.   On 06/24/2016 she underwent angiography which revealed a left superficial femoral artery acute/subacute occlusion.  A jet stream device was used for thrombectomy and atherectomy of this area, followed by drug coated balloon angioplasty using a 5 mm balloon.  There was residual dissection that did not respond to prolonged balloon inflation, and therefore this was treated with a 6 x 80 self-expanding stent.  After walking about 1/4 mile her right calf starts to hurt, her toes at the plantar aspect of right foot feel numb after walking about 1/8 mile; she feels this has worsened but is not life limiting for her.  The toes in her left leg will start to feel numb after walking a long distance, no other left leg claudication symptoms, denies non healing wounds in either lower extremity.   07-28-16 Dr. Myra Gianotti: Duplex of the left leg was performed which I have reviewed.  This shows triphasic waveforms throughout without any significant stenosis Bilateral claudication, left greater than right:  The patient has undergone successful revascularization of the left leg.  Her symptoms of the left leg have completely resolved.  She does not feel that the right leg symptoms warrant further attention at this time.  She will be placed on surveillance protocol.  I told her that I would like for her to continue taking her aspirin and Plavix for at least 3 months.  If she has difficulty tolerating the Plavix, she could stop it at that time.  If she is not having difficulty, I would recommend  continuing her Plavix in addition to her aspirin.  Pt Diabetic: No Pt smoker: smoker  (a pack/week, started in her 20's)  Pt meds include: Statin :No, myalgia reaction to simvastatin Betablocker: Yes ASA: Yes Other anticoagulants/antiplatelets: Plavix. She is not having excessive bruising or bleeding issues.   Past Medical History:  Diagnosis Date  . Acute viral syndrome   . Anemia    Vit D deficiency  . Arteritis, unspecified (HCC) November 01, 2013  . Arthritis    "fingers, knees, upper spine" (06/24/2016)  . B12 deficiency anemia    "had injections in the 1970s"  . CAD (coronary artery disease)   . COPD (chronic obstructive pulmonary disease) (HCC)    "no one has ever told me I have this" (06/24/2016)  . Diverticulitis   . GERD (gastroesophageal reflux disease)   . Hyperlipidemia   . Hypertension   . Osteoporosis   . Pain October 28, 2013   Left Lower quadrant / back  . Thrombocytopenia [facility])     Social History Social History  Substance Use Topics  . Smoking status: Current Every Day Smoker    Packs/day: 0.12    Years: 43.00    Types: Cigarettes  . Smokeless tobacco: Never Used     Comment: 2.5-5 cigarettes per day  . Alcohol use 1.2 - 1.8 oz/week    2 - 3 Standard drinks or equivalent per week     Comment: 06/24/2016 "might have a few drinks/year"    Family History Family History  Problem Relation Age of Onset  . Hypertension Mother   . Hyperlipidemia  Mother     Past Surgical History:  Procedure Laterality Date  . ABDOMINAL AORTOGRAM W/LOWER EXTREMITY N/A 06/24/2016   Procedure: Abdominal Aortogram w/Lower Extremity;  Surgeon: Nada LibmanVance W Brabham, MD;  Location: [facility] INVASIVE CV LAB;  Service: Cardiovascular;  Laterality: N/A;  . ABDOMINAL HYSTERECTOMY    . APPENDECTOMY    . COLONOSCOPY  Aug. 4, 2014  . TUBAL LIGATION      Allergies  Allergen Reactions  . Ace Inhibitors Cough  . Trilipix [Choline Fenofibrate] Other (See Comments)    Muscle cramps, muscle stiffness   . Zocor [Simvastatin] Other (See Comments)    Myalgia    Current Outpatient Prescriptions  Medication Sig Dispense Refill  . alum & mag hydroxide-simeth (MAALOX/MYLANTA) 200-200-20 MG/5ML suspension Take 30 mLs by mouth 2 (two) times daily as needed for indigestion or heartburn.    . Artificial Tear Solution (SOOTHE XP OP) Apply 1 drop to eye daily as needed (dry eyes).    [name]. aspirin EC 81 MG tablet Take 81 mg by mouth daily.    . Calcium Citrate-Vitamin D (CALCIUM + D PO) Take 1 tablet by mouth 3 (three) times a week.    . Cholecalciferol (VITAMIN D3) 1000 units CAPS Take 1,000 Units by mouth daily.    . clopidogrel (PLAVIX) 75 MG tablet Take 1 tablet (75 mg total) by mouth daily. 30 tablet 11  . diphenhydrAMINE (BENADRYL) 25 MG tablet Take 25 mg by mouth at bedtime as needed for allergies.    [name]. ezetimibe (ZETIA) 10 MG tablet Take 10 mg by mouth at bedtime.     [name]. ibuprofen (ADVIL,MOTRIN) 200 MG tablet Take 400 mg by mouth 2 (two) times daily as needed for headache or moderate pain.    . Lidocaine (ASPERCREME LIDOCAINE) 4 % PTCH Apply 1 patch topically daily as needed (pain).    [name]. losartan-hydrochlorothiazide (HYZAAR) 100-25 MG per tablet Take 1 tablet by mouth daily.    . metoprolol tartrate (LOPRESSOR) 25 MG tablet Take 25 mg by mouth 2 (two) times daily.    . Omega-3 Fatty Acids (FISH OIL) 1200 MG CAPS Take 1,200 mg by mouth daily.    [name]. oxymetazoline (AFRIN) 0.05 % nasal spray Place 1 spray into both nostrils 2 (two) times daily as needed for congestion.    [name]. Phenylephrine-Acetaminophen (SUDAFED PE SINUS HEADACHE MAX PO) Take 1 tablet by mouth 2 (two) times daily as needed (allergies).    . trolamine salicylate (ASPERCREME) 10 % cream Apply 1 application topically as needed for muscle pain.     No current facility-administered medications for this visit.     ROS: See HPI for pertinent positives and negatives.   Physical Examination  Vitals:   10/27/16 0957 10/27/16 1004  BP: (!)  167/72 108/80  Pulse: 71   Resp: 16   Temp: 97.8 F (36.6 C)   TempSrc: Oral   SpO2: 98%   Weight: 129 lb 12.8 oz (58.9 kg)   Height: 5\' 4"  (1.626 m)    Body mass index is 22.28 kg/m.  General: A&O x 3, WDWN, female. Gait: normal Eyes: PERRLA. Pulmonary: Respirations are non labored, CTAB, good air movement Cardiac: regular rhythm and rate, no detected murmur.         Carotid Bruits Right Left   Negative Negative   Radial pulses are 1+ palpable bilaterally   Adominal aortic pulse is not palpable  VASCULAR EXAM: Extremities without ischemic changes, without Gangrene; without open wounds.                                                                                                          LE Pulses Right Left       FEMORAL  2+ palpable  not palpable        POPLITEAL  not palpable   not palpable       POSTERIOR TIBIAL  not palpable   not palpable        DORSALIS PEDIS      ANTERIOR TIBIAL not palpable  1+ palpable    Abdomen: soft, NT, no palpable masses. Skin: no rashes, no ulcers noted. Musculoskeletal: no muscle wasting or atrophy.  Neurologic: A&O X 3; Appropriate Affect ; SENSATION: normal; MOTOR FUNCTION:  moving all extremities equally, motor strength 5/5 throughout. Speech is fluent/normal. CN 2-12 intact.    ASSESSMENT: NADRA HRITZ is a 71 y.o. female who is s/p thrombectomy and atherectomy of left superficial femoral artery acute/subacute occlusion, followed by drug coated balloon angioplasty using a 5 mm balloon on 06-24-16. There was residual dissection that did not respond to prolonged balloon inflation, and therefore this was treated with a 6 x 80 self-expanding stent. She has significant relief of left leg claudication symptoms. She has moderate claudication in her right lower extremity that is not life limiting for her.   Her atherosclerotic risk factors include current smoker (a pack per week), CAD, and COPD. Fortunately  she does not have DM and her BMI is normal.    DATA  Left LE Arterial Duplex (10/27/16): Left SFA stent with no significant stenosis. All triphasic waveforms.  No significant change compared to the last exam on 07-23-16.  ABI (Date: 10/27/2016)  R:   ABI: 0.64 (no previous),   PT: bi  DP: bi  TBI:  0.39  L:   ABI: 0.85 (no previous),   PT: bi  DP: tri  TBI: 0.45   PLAN:  Based on the patient's vascular studies and examination, pt will return to clinic in 3 months with ABI's and left LE arterial duplex.  I advised pt to notify us if she develops concerns re the circulation in her feet or legs.   The patient was counseled re smoking cessation and given several free resources re smoking cessation.  I discussed in depth with the patient the nature of atherosclerosis, and emphasized the importance of maximal medical management including strict control of blood pressure, blood glucose, and lipid levels, obtaining regular exercise, and cessation of smoking.  The patient is aware that without maximal medical management the underlying atherosclerotic disease process will progress, limiting the benefit of any interventions.  The patient was given information about PAD including signs, symptoms, treatment, what symptoms should prompt the patient to seek immediate medical care, and risk reduction measures to take.  Charisse March, RN, MSN, FNP-C [facility] [place] Phone: [phone]  Clinic MD: Early on call  10/27/16 10:07 AM

## 2016-10-27 NOTE — Patient Instructions (Signed)
Steps to Quit Smoking Smoking tobacco can be bad for your health. It can also affect almost every organ in your body. Smoking puts you and people around you at risk for many serious long-lasting (chronic) diseases. Quitting smoking is hard, but it is one of the best things that you can do for your health. It is never too late to quit. What are the benefits of quitting smoking? When you quit smoking, you lower your risk for getting serious diseases and conditions. They can include:  Lung cancer or lung disease.  Heart disease.  Stroke.  Heart attack.  Not being able to have children (infertility).  Weak bones (osteoporosis) and broken bones (fractures).  If you have coughing, wheezing, and shortness of breath, those symptoms may get better when you quit. You may also get sick less often. If you are pregnant, quitting smoking can help to lower your chances of having a baby of low birth weight. What can I do to help me quit smoking? Talk with your doctor about what can help you quit smoking. Some things you can do (strategies) include:  Quitting smoking totally, instead of slowly cutting back how much you smoke over a period of time.  Going to in-person counseling. You are more likely to quit if you go to many counseling sessions.  Using resources and support systems, such as: ? Online chats with a counselor. ? Phone quitlines. ? Printed self-help materials. ? Support groups or group counseling. ? Text messaging programs. ? Mobile phone apps or applications.  Taking medicines. Some of these medicines may have nicotine in them. If you are pregnant or breastfeeding, do not take any medicines to quit smoking unless your doctor says it is okay. Talk with your doctor about counseling or other things that can help you.  Talk with your doctor about using more than one strategy at the same time, such as taking medicines while you are also going to in-person counseling. This can help make  quitting easier. What things can I do to make it easier to quit? Quitting smoking might feel very hard at first, but there is a lot that you can do to make it easier. Take these steps:  Talk to your family and friends. Ask them to support and encourage you.  Call phone quitlines, reach out to support groups, or work with a counselor.  Ask people who smoke to not smoke around you.  Avoid places that make you want (trigger) to smoke, such as: ? Bars. ? Parties. ? Smoke-break areas at work.  Spend time with people who do not smoke.  Lower the stress in your life. Stress can make you want to smoke. Try these things to help your stress: ? Getting regular exercise. ? Deep-breathing exercises. ? Yoga. ? Meditating. ? Doing a body scan. To do this, close your eyes, focus on one area of your body at a time from head to toe, and notice which parts of your body are tense. Try to relax the muscles in those areas.  Download or buy apps on your mobile phone or tablet that can help you stick to your quit plan. There are many free apps, such as QuitGuide from the CDC (Centers for Disease Control and Prevention). You can find more support from smokefree.gov and other websites.  This information is not intended to replace advice given to you by your health care provider. Make sure you discuss any questions you have with your health care provider. Document Released: 02/01/2009 Document   Revised: 12/04/2015 Document Reviewed: 08/22/2014 Elsevier Interactive Patient Education  2018 Elsevier Inc.     Intermittent Claudication Intermittent claudication is pain in your leg that occurs when you walk or exercise and goes away when you rest. The pain can occur in one or both legs. What are the causes? Intermittent claudication is caused by the buildup of plaque within the major arteries in the body (atherosclerosis). The plaque, which makes arteries stiff and narrow, prevents enough blood from reaching your  leg muscles. The pain occurs when you walk or exercise because your muscles need more blood when you are moving and exercising. What increases the risk? Risk factors include:  A family history of atherosclerosis.  A personal history of stroke or heart disease.  Older age.  Being inactive or overweight.  Smoking cigarettes.  Having another health condition such as: ? Diabetes. ? High blood pressure. ? High cholesterol.  What are the signs or symptoms? Your hip or leg may:  Ache.  Cramp.  Feel tight.  Feel weak.  Feel heavy.  Over time, you may feel pain in your calf, thigh, or hip. How is this diagnosed? Your health care provider may diagnose intermittent claudication based on your symptoms and medical history. Your health care provider may also do tests to learn more about your condition. These may include:  Blood tests.  An ultrasound.  Imaging tests such as angiography, magnetic resonance angiography (MRA), and computed tomography angiography (CTA).  How is this treated? You may be treated for problems such as:  High blood pressure.  High cholesterol.  Diabetes.  Other treatments may include:  Lifestyle changes such as: ? Starting an exercise program. ? Losing weight. ? Quitting smoking.  Medicines to help restore blood flow through your legs.  Blood vessel surgery (angioplasty) to restore blood flow if your intermittent claudication is caused by severe peripheral artery disease.  Follow these instructions at home:  Manage any other health conditions you have.  Eat a diet low in saturated fats and calories to maintain a healthy weight.  Quit smoking, if you smoke.  Take medicines only as directed by your health care provider.  If your health care provider recommended an exercise program for you, follow it as directed. Your exercise program may involve: ? Walking three or more times a week. ? Walking until you have certain symptoms of  intermittent claudication. ? Resting until symptoms go away. ? Gradually increasing walking time to about 50 minutes a day. Contact a health care provider if: Your condition is not getting better or is getting worse. Get help right away if:  You have chest pain.  You have difficulty breathing.  You develop arm weakness.  You have trouble speaking.  Your face begins to droop. This information is not intended to replace advice given to you by your health care provider. Make sure you discuss any questions you have with your health care provider. Document Released: 02/08/2004 Document Revised: 09/13/2015 Document Reviewed: 07/14/2013 Elsevier Interactive Patient Education  2017 Elsevier Inc.  

## 2016-10-29 NOTE — Addendum Note (Signed)
Addended by: Burton ApleyPETTY, Ronak Duquette A on: 10/29/2016 01:21 PM   Modules accepted: Orders

## 2016-12-02 DIAGNOSIS — S52502A Unspecified fracture of the lower end of left radius, initial encounter for closed fracture: Secondary | ICD-10-CM | POA: Diagnosis not present

## 2016-12-02 DIAGNOSIS — S52122A Displaced fracture of head of left radius, initial encounter for closed fracture: Secondary | ICD-10-CM | POA: Diagnosis not present

## 2016-12-04 DIAGNOSIS — S52515A Nondisplaced fracture of left radial styloid process, initial encounter for closed fracture: Secondary | ICD-10-CM | POA: Diagnosis not present

## 2016-12-08 DIAGNOSIS — I10 Essential (primary) hypertension: Secondary | ICD-10-CM | POA: Diagnosis not present

## 2016-12-08 DIAGNOSIS — F17210 Nicotine dependence, cigarettes, uncomplicated: Secondary | ICD-10-CM | POA: Diagnosis not present

## 2016-12-08 DIAGNOSIS — Z79899 Other long term (current) drug therapy: Secondary | ICD-10-CM | POA: Diagnosis not present

## 2016-12-08 DIAGNOSIS — Z7902 Long term (current) use of antithrombotics/antiplatelets: Secondary | ICD-10-CM | POA: Diagnosis not present

## 2016-12-08 DIAGNOSIS — I2510 Atherosclerotic heart disease of native coronary artery without angina pectoris: Secondary | ICD-10-CM | POA: Diagnosis not present

## 2016-12-08 DIAGNOSIS — E785 Hyperlipidemia, unspecified: Secondary | ICD-10-CM | POA: Diagnosis not present

## 2016-12-08 DIAGNOSIS — S52502A Unspecified fracture of the lower end of left radius, initial encounter for closed fracture: Secondary | ICD-10-CM | POA: Diagnosis not present

## 2016-12-08 DIAGNOSIS — S52572A Other intraarticular fracture of lower end of left radius, initial encounter for closed fracture: Secondary | ICD-10-CM | POA: Diagnosis not present

## 2016-12-16 ENCOUNTER — Encounter: Payer: Self-pay | Admitting: Surgery

## 2017-01-27 ENCOUNTER — Ambulatory Visit (INDEPENDENT_AMBULATORY_CARE_PROVIDER_SITE_OTHER): Payer: PPO | Admitting: Family

## 2017-01-27 ENCOUNTER — Ambulatory Visit (INDEPENDENT_AMBULATORY_CARE_PROVIDER_SITE_OTHER)
Admission: RE | Admit: 2017-01-27 | Discharge: 2017-01-27 | Disposition: A | Discharge status: Home / Self Care | Payer: PPO | Source: Ambulatory Visit | Attending: Family | Admitting: Family

## 2017-01-27 ENCOUNTER — Ambulatory Visit (HOSPITAL_COMMUNITY)
Admission: RE | Admit: 2017-01-27 | Discharge: 2017-01-27 | Disposition: A | Discharge status: Home / Self Care | Payer: PPO | Source: Ambulatory Visit | Attending: Family | Admitting: Family

## 2017-01-27 ENCOUNTER — Encounter: Payer: Self-pay | Admitting: Family

## 2017-01-27 VITALS — BP 155/80 | HR 54 | Temp 97.4°F | Resp 16 | Ht 64.0 in | Wt 125.5 lb

## 2017-01-27 DIAGNOSIS — I70213 Atherosclerosis of native arteries of extremities with intermittent claudication, bilateral legs: Secondary | ICD-10-CM

## 2017-01-27 DIAGNOSIS — Z95820 Peripheral vascular angioplasty status with implants and grafts: Secondary | ICD-10-CM | POA: Diagnosis not present

## 2017-01-27 DIAGNOSIS — F17200 Nicotine dependence, unspecified, uncomplicated: Secondary | ICD-10-CM | POA: Diagnosis not present

## 2017-01-27 NOTE — Patient Instructions (Signed)
Steps to Quit Smoking Smoking tobacco can be bad for your health. It can also affect almost every organ in your body. Smoking puts you and people around you at risk for many serious long-lasting (chronic) diseases. Quitting smoking is hard, but it is one of the best things that you can do for your health. It is never too late to quit. What are the benefits of quitting smoking? When you quit smoking, you lower your risk for getting serious diseases and conditions. They can include:  Lung cancer or lung disease.  Heart disease.  Stroke.  Heart attack.  Not being able to have children (infertility).  Weak bones (osteoporosis) and broken bones (fractures).  If you have coughing, wheezing, and shortness of breath, those symptoms may get better when you quit. You may also get sick less often. If you are pregnant, quitting smoking can help to lower your chances of having a baby of low birth weight. What can I do to help me quit smoking? Talk with your doctor about what can help you quit smoking. Some things you can do (strategies) include:  Quitting smoking totally, instead of slowly cutting back how much you smoke over a period of time.  Going to in-person counseling. You are more likely to quit if you go to many counseling sessions.  Using resources and support systems, such as: ? Online chats with a counselor. ? Phone quitlines. ? Printed self-help materials. ? Support groups or group counseling. ? Text messaging programs. ? Mobile phone apps or applications.  Taking medicines. Some of these medicines may have nicotine in them. If you are pregnant or breastfeeding, do not take any medicines to quit smoking unless your doctor says it is okay. Talk with your doctor about counseling or other things that can help you.  Talk with your doctor about using more than one strategy at the same time, such as taking medicines while you are also going to in-person counseling. This can help make  quitting easier. What things can I do to make it easier to quit? Quitting smoking might feel very hard at first, but there is a lot that you can do to make it easier. Take these steps:  Talk to your family and friends. Ask them to support and encourage you.  Call phone quitlines, reach out to support groups, or work with a counselor.  Ask people who smoke to not smoke around you.  Avoid places that make you want (trigger) to smoke, such as: ? Bars. ? Parties. ? Smoke-break areas at work.  Spend time with people who do not smoke.  Lower the stress in your life. Stress can make you want to smoke. Try these things to help your stress: ? Getting regular exercise. ? Deep-breathing exercises. ? Yoga. ? Meditating. ? Doing a body scan. To do this, close your eyes, focus on one area of your body at a time from head to toe, and notice which parts of your body are tense. Try to relax the muscles in those areas.  Download or buy apps on your mobile phone or tablet that can help you stick to your quit plan. There are many free apps, such as QuitGuide from the CDC (Centers for Disease Control and Prevention). You can find more support from smokefree.gov and other websites.  This information is not intended to replace advice given to you by your health care provider. Make sure you discuss any questions you have with your health care provider. Document Released: 02/01/2009 Document   Revised: 12/04/2015 Document Reviewed: 08/22/2014 Elsevier Interactive Patient Education  2018 Elsevier Inc.     Intermittent Claudication Intermittent claudication is pain in your leg that occurs when you walk or exercise and goes away when you rest. The pain can occur in one or both legs. What are the causes? Intermittent claudication is caused by the buildup of plaque within the major arteries in the body (atherosclerosis). The plaque, which makes arteries stiff and narrow, prevents enough blood from reaching your  leg muscles. The pain occurs when you walk or exercise because your muscles need more blood when you are moving and exercising. What increases the risk? Risk factors include:  A family history of atherosclerosis.  A personal history of stroke or heart disease.  Older age.  Being inactive or overweight.  Smoking cigarettes.  Having another health condition such as: ? Diabetes. ? High blood pressure. ? High cholesterol.  What are the signs or symptoms? Your hip or leg may:  Ache.  Cramp.  Feel tight.  Feel weak.  Feel heavy.  Over time, you may feel pain in your calf, thigh, or hip. How is this diagnosed? Your health care provider may diagnose intermittent claudication based on your symptoms and medical history. Your health care provider may also do tests to learn more about your condition. These may include:  Blood tests.  An ultrasound.  Imaging tests such as angiography, magnetic resonance angiography (MRA), and computed tomography angiography (CTA).  How is this treated? You may be treated for problems such as:  High blood pressure.  High cholesterol.  Diabetes.  Other treatments may include:  Lifestyle changes such as: ? Starting an exercise program. ? Losing weight. ? Quitting smoking.  Medicines to help restore blood flow through your legs.  Blood vessel surgery (angioplasty) to restore blood flow if your intermittent claudication is caused by severe peripheral artery disease.  Follow these instructions at home:  Manage any other health conditions you have.  Eat a diet low in saturated fats and calories to maintain a healthy weight.  Quit smoking, if you smoke.  Take medicines only as directed by your health care provider.  If your health care provider recommended an exercise program for you, follow it as directed. Your exercise program may involve: ? Walking three or more times a week. ? Walking until you have certain symptoms of  intermittent claudication. ? Resting until symptoms go away. ? Gradually increasing walking time to about 50 minutes a day. Contact a health care provider if: Your condition is not getting better or is getting worse. Get help right away if:  You have chest pain.  You have difficulty breathing.  You develop arm weakness.  You have trouble speaking.  Your face begins to droop. This information is not intended to replace advice given to you by your health care provider. Make sure you discuss any questions you have with your health care provider. Document Released: 02/08/2004 Document Revised: 09/13/2015 Document Reviewed: 07/14/2013 Elsevier Interactive Patient Education  2017 Elsevier Inc.  

## 2017-01-27 NOTE — Progress Notes (Signed)
[facility] [place] [place]   CC: Follow up peripheral artery occlusive disease  History of Present Illness SHELI Romero is a 71 y.o. female who Dr. Myra Gianotti has been following for claudication since 2017. We have tried noninvasive measures such as medications and exercise programs, however these have not been successful, and the patient desired pursuing arteriogram with intervention.   On 06/24/2016 she underwent angiography which revealed a left superficial femoral artery acute/subacute occlusion. A jet stream device was used for thrombectomy and atherectomy of this area, followed by drug coated balloon angioplasty using a 5 mm balloon. There was residual dissection that did not respond to prolonged balloon inflation, and therefore this was treated with a 6 x 80 self-expanding stent.  After walking about 1/4 mile her right calf starts to hurt, her toes at the plantar aspect of right foot feel numb after walking about 1/8 mile; she feels this is stable, not life limiting for her.  The toes in her left leg will start to feel numb after walking a long distance, no other left leg claudication symptoms, denies non healing wounds in either lower extremity.   07-28-16 Dr. Myra Gianotti: Duplex of the left leg was performed which I have reviewed. This shows triphasic waveforms throughout without any significant stenosis Bilateral claudication, left greater than right: The patient has undergone successful revascularization of the left leg. Her symptoms of the left leg have completely resolved. She does not feel that the right leg symptoms warrant further attention at this time. She will be placed on surveillance protocol. I told her that I would like for her to continue taking her aspirin and Plavix for at least 3 months. If she has difficulty tolerating the Plavix, she could stop it at that time. If she is not having difficulty, I would recommend continuing her Plavix in addition to  her aspirin.  She fell a sort distance off a ladder and fractured her left wrist, had an ORIF of left wrist 12-08-16, is wearing a left wrist splint.   Pt indicates that her blood pressure has been labile,   Pt Diabetic: No Pt smoker: smoker  (a pack/week, started in her 20's), pt states she stresses over small things  Pt meds include: Statin :No, myalgia reaction to simvastatin Betablocker: Yes ASA: Yes Other anticoagulants/antiplatelets: Plavix. She is not having excessive bruising or bleeding issues    Past Medical History:  Diagnosis Date  . Acute viral syndrome   . Anemia    Vit D deficiency  . Arteritis, unspecified (HCC) November 01, 2013  . Arthritis    "fingers, knees, upper spine" (06/24/2016)  . B12 deficiency anemia    "had injections in the 1970s"  . CAD (coronary artery disease)   . COPD (chronic obstructive pulmonary disease) (HCC)    "no one has ever told me I have this" (06/24/2016)  . Diverticulitis   . GERD (gastroesophageal reflux disease)   . Hyperlipidemia   . Hypertension   . Osteoporosis   . Pain October 28, 2013   Left Lower quadrant / back  . Thrombocytopenia [facility])     Social History Social History  Substance Use Topics  . Smoking status: Current Every Day Smoker    Packs/day: 0.12    Years: 43.00    Types: Cigarettes  . Smokeless tobacco: Never Used     Comment: 2.5-5 cigarettes per day  . Alcohol use 1.2 - 1.8 oz/week    2 - 3 Standard drinks or equivalent per  week     Comment: 06/24/2016 "might have a few drinks/year"    Family History Family History  Problem Relation Age of Onset  . Hypertension Mother   . Hyperlipidemia Mother     Past Surgical History:  Procedure Laterality Date  . ABDOMINAL AORTOGRAM W/LOWER EXTREMITY N/A 06/24/2016   Procedure: Abdominal Aortogram w/Lower Extremity;  Surgeon: Nada Libman, MD;  Location: [facility];  Service: Cardiovascular;  Laterality: N/A;  . ABDOMINAL HYSTERECTOMY    . APPENDECTOMY     . COLONOSCOPY  Aug. 4, 2014  . TUBAL LIGATION      Allergies  Allergen Reactions  . Ace Inhibitors Cough  . Trilipix [Choline Fenofibrate] Other (See Comments)    Muscle cramps, muscle stiffness  . Zocor [Simvastatin] Other (See Comments)    Myalgia    Current Outpatient Prescriptions  Medication Sig Dispense Refill  . alum & mag hydroxide-simeth (MAALOX/MYLANTA) 200-200-20 MG/5ML suspension Take 30 mLs by mouth 2 (two) times daily as needed for indigestion or heartburn.    . Artificial Tear Solution (SOOTHE XP OP) Apply 1 drop to eye daily as needed (dry eyes).    [name] aspirin EC 81 MG tablet Take 81 mg by mouth daily.    . Calcium Citrate-Vitamin D (CALCIUM + D PO) Take 1 tablet by mouth 3 (three) times a week.    . Cholecalciferol (VITAMIN D3) 1000 units CAPS Take 1,000 Units by mouth daily.    . clopidogrel (PLAVIX) 75 MG tablet Take 1 tablet (75 mg total) by mouth daily. 30 tablet 11  . diphenhydrAMINE (BENADRYL) 25 MG tablet Take 25 mg by mouth at bedtime as needed for allergies.    [name] ezetimibe (ZETIA) 10 MG tablet Take 10 mg by mouth at bedtime.     [name] ibuprofen (ADVIL,MOTRIN) 200 MG tablet Take 400 mg by mouth 2 (two) times daily as needed for headache or moderate pain.    . Lidocaine (ASPERCREME LIDOCAINE) 4 % PTCH Apply 1 patch topically daily as needed (pain).    [name] losartan-hydrochlorothiazide (HYZAAR) 100-25 MG per tablet Take 1 tablet by mouth daily.    . metoprolol tartrate (LOPRESSOR) 25 MG tablet Take 12.5 mg by mouth every morning.     . Omega-3 Fatty Acids (FISH OIL) 1200 MG CAPS Take 1,200 mg by mouth daily.    [name] oxymetazoline (AFRIN) 0.05 % nasal spray Place 1 spray into both nostrils 2 (two) times daily as needed for congestion.    [name] Phenylephrine-Acetaminophen (SUDAFED PE SINUS HEADACHE MAX PO) Take 1 tablet by mouth 2 (two) times daily as needed (allergies).    . trolamine salicylate (ASPERCREME) 10 % cream Apply 1 application topically as needed for muscle pain.      No current facility-administered medications for this visit.     ROS: See HPI for pertinent positives and negatives.   Physical Examination  Vitals:   01/27/17 1059 01/27/17 1100  BP: (!) 161/86 (!) 155/80  Pulse: (!) 54   Resp: 16   Temp: (!) 97.4 F (36.3 C)   TempSrc: Oral   SpO2: 100%   Weight: 125 lb 8 oz (56.9 kg)   Height:  (1.626 m)    Body mass index is 21.54 kg/m.  General: A&O x 3, WDWN, female. Gait: normal Eyes: PERRLA. Pulmonary: Respirations are non labored, CTAB, good air movement Cardiac: regular rhythm and rate, no detected murmur.         Carotid Bruits Right Left   Negative  Negative   Radial pulses are 1+ palpable bilaterally   Adominal aortic pulse is not palpable                         VASCULAR EXAM: Extremities without ischemic changes, without Gangrene; without open wounds.                                                                                                                                                       LE Pulses Right Left       FEMORAL  2+ palpable  not palpable        POPLITEAL  not palpable   not palpable       POSTERIOR TIBIAL  not palpable   not palpable        DORSALIS PEDIS      ANTERIOR TIBIAL not palpable  1+ palpable    Abdomen: soft, NT, no palpable masses. Skin: no rashes, no ulcers noted. Musculoskeletal: no muscle wasting or atrophy.      Neurologic: A&O X 3; Appropriate Affect ; SENSATION: normal; MOTOR FUNCTION:  moving all extremities equally, motor strength 5/5 throughout. Speech is fluent/normal. CN 2-12 intact.      ASSESSMENT: Carla Romero is a 71 y.o. female who is s/p thrombectomy and atherectomy of left superficial femoral artery acute/subacute occlusion, followed by drug coated balloon angioplasty using a 5 mm balloon on 06-24-16. There was residual dissection that did not respond to prolonged balloon inflation, and therefore this was treated with a 6 x 80  self-expanding stent. She has significant relief of left leg claudication symptoms. She has moderate claudication in her right lower extremity that is not life limiting for her.   Her atherosclerotic risk factors include current smoker (a pack per week), CAD, and COPD. Fortunately she does not have DM and her BMI is normal.    She is performing a graduated walking program.    DATA  Left LE Arterial Duplex (01-27-17): Left SFA stent with no significant stenosis. All triphasic waveforms.  No significant change compared to the exams on 07-23-16 and 10-27-16.   ABI (Date: 01/27/2017):  R:   ABI: 0.76 (was 0.64 on 10-27-16),   PT: mono  DP: mono  TBI:  0.32  L:   ABI: 0.93 (was 0.85 ),   PT: bi  DP: tri  TBI: 0.55  Improvement in bilateral ABI: moderate disease in the right LE, mild in the left.     PLAN:  Continue graduated walking program.   The patient was counseled re smoking cessation and given several free resources re smoking cessation.  Based on the patient's vascular studies and examination, pt will return to clinic in 6 months with ABI's and left LE arterial duplex.  I advised pt to notify us if  she develops concerns re the circulation in her feet or legs.   I discussed in depth with the patient the nature of atherosclerosis, and emphasized the importance of maximal medical management including strict control of blood pressure, blood glucose, and lipid levels, obtaining regular exercise, and cessation of smoking.  The patient is aware that without maximal medical management the underlying atherosclerotic disease process will progress, limiting the benefit of any interventions.  The patient was given information about PAD including signs, symptoms, treatment, what symptoms should prompt the patient to seek immediate medical care, and risk reduction measures to take.  Charisse March, RN, MSN, FNP-C [facility] Phone:  [phone]  Clinic MD: Early  01/27/17 11:25 AM

## 2017-02-03 DIAGNOSIS — S52502A Unspecified fracture of the lower end of left radius, initial encounter for closed fracture: Secondary | ICD-10-CM | POA: Diagnosis not present

## 2017-02-03 NOTE — Addendum Note (Signed)
Addended by: Burton Apley A on: 02/03/2017 04:33 PM   Modules accepted: Orders

## 2017-03-17 DIAGNOSIS — S52502D Unspecified fracture of the lower end of left radius, subsequent encounter for closed fracture with routine healing: Secondary | ICD-10-CM | POA: Diagnosis not present

## 2017-03-25 DIAGNOSIS — I10 Essential (primary) hypertension: Secondary | ICD-10-CM | POA: Diagnosis not present

## 2017-03-25 DIAGNOSIS — Z23 Encounter for immunization: Secondary | ICD-10-CM | POA: Diagnosis not present

## 2017-03-25 DIAGNOSIS — Z0000 Encounter for general adult medical examination without abnormal findings: Secondary | ICD-10-CM | POA: Diagnosis not present

## 2017-03-25 DIAGNOSIS — E782 Mixed hyperlipidemia: Secondary | ICD-10-CM | POA: Diagnosis not present

## 2017-03-25 DIAGNOSIS — Z6821 Body mass index (BMI) 21.0-21.9, adult: Secondary | ICD-10-CM | POA: Diagnosis not present

## 2017-07-21 DIAGNOSIS — H2513 Age-related nuclear cataract, bilateral: Secondary | ICD-10-CM | POA: Diagnosis not present

## 2017-07-26 DIAGNOSIS — H00021 Hordeolum internum right upper eyelid: Secondary | ICD-10-CM | POA: Diagnosis not present

## 2017-07-26 DIAGNOSIS — H00022 Hordeolum internum right lower eyelid: Secondary | ICD-10-CM | POA: Diagnosis not present

## 2017-07-26 DIAGNOSIS — H1031 Unspecified acute conjunctivitis, right eye: Secondary | ICD-10-CM | POA: Diagnosis not present

## 2017-07-26 DIAGNOSIS — H1032 Unspecified acute conjunctivitis, left eye: Secondary | ICD-10-CM | POA: Diagnosis not present

## 2017-08-03 ENCOUNTER — Ambulatory Visit: Payer: PPO | Admitting: Family

## 2017-08-03 ENCOUNTER — Ambulatory Visit (HOSPITAL_COMMUNITY)
Admission: RE | Admit: 2017-08-03 | Discharge: 2017-08-03 | Disposition: A | Discharge status: Home / Self Care | Payer: PPO | Source: Ambulatory Visit | Attending: Family | Admitting: Family

## 2017-08-03 ENCOUNTER — Ambulatory Visit (INDEPENDENT_AMBULATORY_CARE_PROVIDER_SITE_OTHER)
Admission: RE | Admit: 2017-08-03 | Discharge: 2017-08-03 | Disposition: A | Discharge status: Home / Self Care | Payer: PPO | Source: Ambulatory Visit | Attending: Family | Admitting: Family

## 2017-08-03 ENCOUNTER — Encounter: Payer: Self-pay | Admitting: Family

## 2017-08-03 VITALS — BP 157/73 | HR 52 | Temp 97.3°F | Resp 18 | Ht 64.0 in | Wt 131.0 lb

## 2017-08-03 DIAGNOSIS — I70202 Unspecified atherosclerosis of native arteries of extremities, left leg: Secondary | ICD-10-CM | POA: Insufficient documentation

## 2017-08-03 DIAGNOSIS — Z95820 Peripheral vascular angioplasty status with implants and grafts: Secondary | ICD-10-CM

## 2017-08-03 DIAGNOSIS — I70213 Atherosclerosis of native arteries of extremities with intermittent claudication, bilateral legs: Secondary | ICD-10-CM | POA: Insufficient documentation

## 2017-08-03 DIAGNOSIS — I779 Disorder of arteries and arterioles, unspecified: Secondary | ICD-10-CM

## 2017-08-03 DIAGNOSIS — F17200 Nicotine dependence, unspecified, uncomplicated: Secondary | ICD-10-CM

## 2017-08-03 NOTE — Patient Instructions (Addendum)
Steps to Quit Smoking Smoking tobacco can be bad for your health. It can also affect almost every organ in your body. Smoking puts you and people around you at risk for many serious long-lasting (chronic) diseases. Quitting smoking is hard, but it is one of the best things that you can do for your health. It is never too late to quit. What are the benefits of quitting smoking? When you quit smoking, you lower your risk for getting serious diseases and conditions. They can include:  Lung cancer or lung disease.  Heart disease.  Stroke.  Heart attack.  Not being able to have children (infertility).  Weak bones (osteoporosis) and broken bones (fractures).  If you have coughing, wheezing, and shortness of breath, those symptoms may get better when you quit. You may also get sick less often. If you are pregnant, quitting smoking can help to lower your chances of having a baby of low birth weight. What can I do to help me quit smoking? Talk with your doctor about what can help you quit smoking. Some things you can do (strategies) include:  Quitting smoking totally, instead of slowly cutting back how much you smoke over a period of time.  Going to in-person counseling. You are more likely to quit if you go to many counseling sessions.  Using resources and support systems, such as: ? Online chats with a counselor. ? Phone quitlines. ? Printed self-help materials. ? Support groups or group counseling. ? Text messaging programs. ? Mobile phone apps or applications.  Taking medicines. Some of these medicines may have nicotine in them. If you are pregnant or breastfeeding, do not take any medicines to quit smoking unless your doctor says it is okay. Talk with your doctor about counseling or other things that can help you.  Talk with your doctor about using more than one strategy at the same time, such as taking medicines while you are also going to in-person counseling. This can help make  quitting easier. What things can I do to make it easier to quit? Quitting smoking might feel very hard at first, but there is a lot that you can do to make it easier. Take these steps:  Talk to your family and friends. Ask them to support and encourage you.  Call phone quitlines, reach out to support groups, or work with a counselor.  Ask people who smoke to not smoke around you.  Avoid places that make you want (trigger) to smoke, such as: ? Bars. ? Parties. ? Smoke-break areas at work.  Spend time with people who do not smoke.  Lower the stress in your life. Stress can make you want to smoke. Try these things to help your stress: ? Getting regular exercise. ? Deep-breathing exercises. ? Yoga. ? Meditating. ? Doing a body scan. To do this, close your eyes, focus on one area of your body at a time from head to toe, and notice which parts of your body are tense. Try to relax the muscles in those areas.  Download or buy apps on your mobile phone or tablet that can help you stick to your quit plan. There are many free apps, such as QuitGuide from the CDC (Centers for Disease Control and Prevention). You can find more support from smokefree.gov and other websites.  This information is not intended to replace advice given to you by your health care provider. Make sure you discuss any questions you have with your health care provider. Document Released: 02/01/2009 Document   Revised: 12/04/2015 Document Reviewed: 08/22/2014 Elsevier Interactive Patient Education  2018 Elsevier Inc.     Peripheral Vascular Disease Peripheral vascular disease (PVD) is a disease of the blood vessels that are not part of your heart and brain. A simple term for PVD is poor circulation. In most cases, PVD narrows the blood vessels that carry blood from your heart to the rest of your body. This can result in a decreased supply of blood to your arms, legs, and internal organs, like your stomach or kidneys.  However, it most often affects a person's lower legs and feet. There are two types of PVD.  Organic PVD. This is the more common type. It is caused by damage to the structure of blood vessels.  Functional PVD. This is caused by conditions that make blood vessels contract and tighten (spasm).  Without treatment, PVD tends to get worse over time. PVD can also lead to acute ischemic limb. This is when an arm or limb suddenly has trouble getting enough blood. This is a medical emergency. Follow these instructions at home:  Take medicines only as told by your doctor.  Do not use any tobacco products, including cigarettes, chewing tobacco, or electronic cigarettes. If you need help quitting, ask your doctor.  Lose weight if you are overweight, and maintain a healthy weight as told by your doctor.  Eat a diet that is low in fat and cholesterol. If you need help, ask your doctor.  Exercise regularly. Ask your doctor for some good activities for you.  Take good care of your feet. ? Wear comfortable shoes that fit well. ? Check your feet often for any cuts or sores. Contact a doctor if:  You have cramps in your legs while walking.  You have leg pain when you are at rest.  You have coldness in a leg or foot.  Your skin changes.  You are unable to get or have an erection (erectile dysfunction).  You have cuts or sores on your feet that are not healing. Get help right away if:  Your arm or leg turns cold and blue.  Your arms or legs become red, warm, swollen, painful, or numb.  You have chest pain or trouble breathing.  You suddenly have weakness in your face, arm, or leg.  You become very confused or you cannot speak.  You suddenly have a very bad headache.  You suddenly cannot see. This information is not intended to replace advice given to you by your health care provider. Make sure you discuss any questions you have with your health care provider. Document Released:  07/02/2009 Document Revised: 09/13/2015 Document Reviewed: 09/15/2013 Elsevier Interactive Patient Education  2017 Elsevier Inc.  

## 2017-08-03 NOTE — Progress Notes (Signed)
[facility] [place]   CC: Follow up peripheral artery occlusive disease  History of Present Illness Carla Romero is a 72 y.o. female who Dr. Daine Gip following for claudication since 2017. We have tried noninvasive measures such as medications and exercise programs, however these have not been successful, and the patient desired pursuing arteriogram with intervention.   On 06/24/2016 she underwent angiography which revealed a left superficial femoral artery acute/subacute occlusion. A jet stream device was used for thrombectomy and atherectomy of this area, followed by drug coated balloon angioplasty using a 5 mm balloon. There was residual dissection that did not respond to prolonged balloon inflation, and therefore this was treated with a 6 x 80 self-expanding stent.  After walking about 1/4 mile her right calf starts to hurt, her toes at theplantar aspect of right foot feel numb after walking about 1/8 mile; she feels this is stable, not life limiting for her.  The toes in her left leg will start to feel numb after walking a long distance, no other left leg claudication symptoms, denies non healing wounds in either lower extremity.   Dr. Myra Gianotti last evaluated pt on 07-28-16. At that time duplex of the left leg showed triphasic waveforms throughout without any significant stenosis Bilateral claudication, left greater than right: The patient has undergone successful revascularization of the left leg. Her symptoms of the left leg had completely resolved. She did not feel that the right leg symptoms warranted further attention at that time. She was placed on surveillance protocol. Dr. Myra Gianotti advised her to continue taking her aspirin and Plavix for at least 3 months. If she has difficulty tolerating the Plavix, she could stop it at that time. If she is not having difficulty, Dr. Myra Gianotti recommended continuing her Plavix in addition to her aspirin.  She  fell a short distance off a ladder and fractured her left wrist, had an ORIF of left wrist 12-08-16, this has healed.    Pt Diabetic: No Pt smoker: smoker (a pack/week, started in her 66's), pt states she stresses over small things  Pt meds include: Statin :No, myalgia reaction to simvastatin Betablocker: Yes ASA: Yes Other anticoagulants/antiplatelets: Plavix. She is not having excessive bruising or bleeding issues Pt states her PCP strongly advises her to take Xarelto for PAD, but pt states that she cannot afford $500/month for it.     Past Medical History:  Diagnosis Date  . Acute viral syndrome   . Anemia    Vit D deficiency  . Arteritis, unspecified (HCC) November 01, 2013  . Arthritis    "fingers, knees, upper spine" (06/24/2016)  . B12 deficiency anemia    "had injections in the 1970s"  . CAD (coronary artery disease)   . COPD (chronic obstructive pulmonary disease) (HCC)    "no one has ever told me I have this" (06/24/2016)  . Diverticulitis   . GERD (gastroesophageal reflux disease)   . Hyperlipidemia   . Hypertension   . Osteoporosis   . Pain October 28, 2013   Left Lower quadrant / back  . Thrombocytopenia [facility])     Social History Social History   Tobacco Use  . Smoking status: Current Every Day Smoker    Packs/day: 0.12    Years: 43.00    Pack years: 5.16    Types: Cigarettes  . Smokeless tobacco: Never Used  . Tobacco comment: 2.5-5 cigarettes per day  Substance Use Topics  . Alcohol use: Yes    Alcohol/week: 1.2 -  1.8 oz    Types: 2 - 3 Standard drinks or equivalent per week    Comment: 06/24/2016 "might have a few drinks/year"  . Drug use: No    Family History Family History  Problem Relation Age of Onset  . Hypertension Mother   . Hyperlipidemia Mother     Past Surgical History:  Procedure Laterality Date  . ABDOMINAL AORTOGRAM W/LOWER EXTREMITY N/A 06/24/2016   Procedure: Abdominal Aortogram w/Lower Extremity;  Surgeon: Nada Libman, MD;   Location: [facility];  Service: Cardiovascular;  Laterality: N/A;  . ABDOMINAL HYSTERECTOMY    . APPENDECTOMY    . COLONOSCOPY  Aug. 4, 2014  . TUBAL LIGATION      Allergies  Allergen Reactions  . Ace Inhibitors Cough  . Trilipix [Choline Fenofibrate] Other (See Comments)    Muscle cramps, muscle stiffness  . Zocor [Simvastatin] Other (See Comments)    Myalgia    Current Outpatient Medications  Medication Sig Dispense Refill  . alum & mag hydroxide-simeth (MAALOX/MYLANTA) 200-200-20 MG/5ML suspension Take 30 mLs by mouth 2 (two) times daily as needed for indigestion or heartburn.    . Artificial Tear Solution (SOOTHE XP OP) Apply 1 drop to eye daily as needed (dry eyes).    [name] aspirin EC 81 MG tablet Take 81 mg by mouth daily.    . Calcium Citrate-Vitamin D (CALCIUM + D PO) Take 1 tablet by mouth 3 (three) times a week.    . clopidogrel (PLAVIX) 75 MG tablet Take 1 tablet (75 mg total) by mouth daily. 30 tablet 11  . diphenhydrAMINE (BENADRYL) 25 MG tablet Take 25 mg by mouth at bedtime as needed for allergies.    [name] ezetimibe (ZETIA) 10 MG tablet Take 10 mg by mouth at bedtime.     [name] ibuprofen (ADVIL,MOTRIN) 200 MG tablet Take 400 mg by mouth 2 (two) times daily as needed for headache or moderate pain.    . Lidocaine (ASPERCREME LIDOCAINE) 4 % PTCH Apply 1 patch topically daily as needed (pain).    [name] losartan-hydrochlorothiazide (HYZAAR) 100-25 MG per tablet Take 1 tablet by mouth daily.    . metoprolol tartrate (LOPRESSOR) 25 MG tablet Take 12.5 mg by mouth every morning.     . Omega-3 Fatty Acids (FISH OIL) 1200 MG CAPS Take 1,200 mg by mouth daily.    [name] oxymetazoline (AFRIN) 0.05 % nasal spray Place 1 spray into both nostrils 2 (two) times daily as needed for congestion.    [name] Phenylephrine-Acetaminophen (SUDAFED PE SINUS HEADACHE MAX PO) Take 1 tablet by mouth 2 (two) times daily as needed (allergies).    . trolamine salicylate (ASPERCREME) 10 % cream Apply 1 application  topically as needed for muscle pain.     No current facility-administered medications for this visit.     ROS: See HPI for pertinent positives and negatives.   Physical Examination  Vitals:   08/03/17 1106 08/03/17 1108  BP: (!) 170/72 (!) 157/73  Pulse: (!) 52   Resp: 18   Temp: (!) 97.3 F (36.3 C)   TempSrc: Oral   SpO2: 96%   Weight: 131 lb (59.4 kg)   Height: 5\' 4"  (1.626 m)    Body mass index is 22.49 kg/m.  General: A&O x 3, WDWN female. Gait: normal HENT: No gross abnormalities  Eyes: PERRLA. Pulmonary: Respirations are non labored, CTAB, good air movement Cardiac: regular rhythm and rate, no detected murmur.    Carotid Bruits Right Left   Negative  Negative   Radial pulses are 1+palpable bilaterally  Adominal aortic pulse is notpalpable   VASCULAR EXAM: Extremitieswithoutischemic changes, withoutGangrene; withoutopen wounds.  LE Pulses Right Left  FEMORAL 2+palpable notpalpable   POPLITEAL notpalpable  notpalpable  POSTERIOR TIBIAL notpalpable  notpalpable   DORSALIS PEDIS ANTERIOR TIBIAL notpalpable  1+palpable    Abdomen: soft, NT, no palpable masses. Skin: no rashes, no cellulitis, no ulcers noted. Musculoskeletal: no muscle wasting or atrophy.  Neurologic: A&O X 3; appropriate affect, Sensation is normal; MOTOR FUNCTION:  moving all extremities equally, motor strength 5/5 throughout. Speech is fluent/normal. CN 2-12 intact. Psychiatric: Thought content is normal, mood appropriate for clinical situation.    ASSESSMENT: Rocky CraftsKathryn G Tavella is a 72 y.o. female who is s/pthrombectomy and atherectomy of left superficial femoral artery acute/subacute occlusion, followed by drug coated balloon angioplasty using a 5 mm balloon on 06-24-16. There was residual dissection that did not respond to prolonged balloon inflation, and therefore this was treated with a 6 x 80  self-expanding stent.  She has significant relief of left leg claudication symptoms. She has moderate claudication in her right lower extremity that is not life limiting for her.   Her atherosclerotic risk factors include current smoker (a pack per week), CAD, and COPD. Fortunately she does not have DM and her BMI is normal.    She is performing a graduated walking program.   Pt cannot afford $500/month for Xarelto for PAD advised by her PCP, which is her out of pocket cost for her.  Daily Plavix and 81 mg ASA seem to be the best alternative for her, she is not have any bleeding issues, no dark stools.   I discussed with Dr. Myra GianottiBrabham lower extremity exam results for today, HPI, and exam results; see Plan.   DATA  Left LE Arterial Duplex (08/03/17): 50-74% stenosis in the proximal popliteal artery, distal to the stent (318 cm/s) Proximal stent with 1-49% stenosis (182 cm/s) Slight increased stenosis compared to the exam on 01-27-17.    ABI (Date: 08/03/2017):  R:   ABI: 0.68 (was 0.76 on 01-27-17),   PT: bi  DP: bi  TBI:  0.61 (was 0.32)  L:   ABI: 1.02 (was 0.93),   PT: bi  DP: bi  TBI: 0.86 (was 0.55)  Slight decline in right ABI, moderate disease. Improvement in left ABI and bilateral TBI. Left ABI indicates no significant disease. All biphasic waveforms.    PLAN:  Continue graduated walking program.   The patient was counseled re smoking cessation and given several free resources re smoking cessation.  Based on the patient's vascular studies and examination, pt will return to clinic in 6 months with ABI's and left LE arterial duplex.  I advised pt to notify us if she develops concerns re the circulation in her feet or legs    I discussed in depth with the patient the nature of atherosclerosis, and emphasized the importance of maximal medical management including strict control of blood pressure, blood glucose, and lipid levels, obtaining regular exercise,  and cessation of smoking.  The patient is aware that without maximal medical management the underlying atherosclerotic disease process will progress, limiting the benefit of any interventions.  The patient was given information about PAD including signs, symptoms, treatment, what symptoms should prompt the patient to seek immediate medical care, and risk reduction measures to take.  Charisse MarchSuzanne Helene Bernstein, RN, MSN, FNP-C [facility] [place] Office Phone: [phone]  Clinic MD: Myra GianottiBrabham  08/03/17 11:19  AM

## 2017-08-05 DIAGNOSIS — J302 Other seasonal allergic rhinitis: Secondary | ICD-10-CM | POA: Diagnosis not present

## 2017-08-05 DIAGNOSIS — H00019 Hordeolum externum unspecified eye, unspecified eyelid: Secondary | ICD-10-CM | POA: Diagnosis not present

## 2017-08-31 ENCOUNTER — Other Ambulatory Visit: Payer: Self-pay

## 2017-08-31 DIAGNOSIS — I779 Disorder of arteries and arterioles, unspecified: Secondary | ICD-10-CM

## 2017-08-31 DIAGNOSIS — Z95820 Peripheral vascular angioplasty status with implants and grafts: Secondary | ICD-10-CM

## 2017-09-22 ENCOUNTER — Other Ambulatory Visit: Payer: Self-pay | Admitting: Surgery

## 2017-09-22 MED ORDER — CLOPIDOGREL BISULFATE 75 MG PO TABS: 75.0000 mg | ORAL_TABLET | Freq: Every day | ORAL | 11 refills | Status: DC

## 2017-09-23 DIAGNOSIS — I70212 Atherosclerosis of native arteries of extremities with intermittent claudication, left leg: Secondary | ICD-10-CM | POA: Diagnosis not present

## 2017-09-23 DIAGNOSIS — Z6821 Body mass index (BMI) 21.0-21.9, adult: Secondary | ICD-10-CM | POA: Diagnosis not present

## 2017-09-23 DIAGNOSIS — E782 Mixed hyperlipidemia: Secondary | ICD-10-CM | POA: Diagnosis not present

## 2017-09-23 DIAGNOSIS — I10 Essential (primary) hypertension: Secondary | ICD-10-CM | POA: Diagnosis not present

## 2017-09-23 DIAGNOSIS — E559 Vitamin D deficiency, unspecified: Secondary | ICD-10-CM | POA: Diagnosis not present

## 2018-02-02 ENCOUNTER — Ambulatory Visit (HOSPITAL_COMMUNITY)
Admission: RE | Admit: 2018-02-02 | Discharge: 2018-02-02 | Disposition: A | Discharge status: Home / Self Care | Payer: PPO | Source: Ambulatory Visit | Attending: Vascular Surgery | Admitting: Vascular Surgery

## 2018-02-02 ENCOUNTER — Ambulatory Visit (INDEPENDENT_AMBULATORY_CARE_PROVIDER_SITE_OTHER)
Admission: RE | Admit: 2018-02-02 | Discharge: 2018-02-02 | Disposition: A | Discharge status: Home / Self Care | Payer: PPO | Source: Ambulatory Visit | Attending: Vascular Surgery | Admitting: Vascular Surgery

## 2018-02-02 ENCOUNTER — Encounter: Payer: Self-pay | Admitting: Family

## 2018-02-02 ENCOUNTER — Ambulatory Visit: Payer: PPO | Admitting: Family

## 2018-02-02 VITALS — BP 161/66 | HR 46 | Temp 97.0°F | Resp 16 | Ht 65.0 in | Wt 125.0 lb

## 2018-02-02 DIAGNOSIS — Z95820 Peripheral vascular angioplasty status with implants and grafts: Secondary | ICD-10-CM

## 2018-02-02 DIAGNOSIS — I779 Disorder of arteries and arterioles, unspecified: Secondary | ICD-10-CM

## 2018-02-02 DIAGNOSIS — F17200 Nicotine dependence, unspecified, uncomplicated: Secondary | ICD-10-CM

## 2018-02-02 NOTE — Progress Notes (Signed)
[facility] [place]   CC: Follow up peripheral artery occlusive disease  History of Present Illness Carla Romero is a 72 y.o. female whom Dr. Daine Romero following for claudication since 2017. We have tried noninvasive measures such as medications and exercise programs, however these have not been successful, and the patient desired pursuing arteriogram with intervention.   On 06/24/2016 she underwent angiography which revealed a left superficial femoral artery acute/subacute occlusion. A jet stream device was used for thrombectomy and atherectomy of this area, followed by drug coated balloon angioplasty using a 5 mm balloon. There was residual dissection that did not respond to prolonged balloon inflation, and therefore this was treated with a 6 x 80 self-expanding stent.  After walking about 30 minutes her right calf starts to hurt, her toes at theplantar aspect of right foot feel numb after walking about 1/8 mile; she feels thisis stable,not life limiting for her.  She denies any problems in her left leg with walking or otherwise.   Dr. Myra Romero last evaluated pt on 07-28-16. At that time duplex of the left leg showed triphasic waveforms throughout without any significant stenosis Bilateral claudication, left greater than right: The patient has undergone successful revascularization of the left leg. Her symptoms of the left leg had completely resolved. She did not feel that the right leg symptoms warranted further attention at that time. She was placed on surveillance protocol. Dr. Myra Romero advised her to continue taking her aspirin and Plavix for at least 3 months. If she has difficulty tolerating the Plavix, she could stop it at that time. If she is not having difficulty, Dr. Myra Romero recommended continuing her Plavix in addition to her aspirin.  She fell a short distance off a ladder and fractured her left wrist, had an ORIF of left wrist 12-08-16, this  has healed.   She walks 30 minutes daily, 7 days/week.   Diabetic: No Tobacco use: current smoker (4-5 cigs/day, started in her 20's), pt states she stresses over small things  Pt meds include: Statin :No, myalgia reaction to simvastatin. She is taking Red Yeast Rice, Co-Q10, and Red Krill oil.  Betablocker: Yes ASA: Yes Other anticoagulants/antiplatelets: Plavix. She is not having excessive bruising or bleeding issues Pt states her PCP strongly advises her to take Xarelto for PAD, but pt states that she cannot afford $500/month for it.      Past Medical History:  Diagnosis Date  . Acute viral syndrome   . Anemia    Vit D deficiency  . Arteritis, unspecified (HCC) November 01, 2013  . Arthritis    "fingers, knees, upper spine" (06/24/2016)  . B12 deficiency anemia    "had injections in the 1970s"  . CAD (coronary artery disease)   . COPD (chronic obstructive pulmonary disease) (HCC)    "no one has ever told me I have this" (06/24/2016)  . Diverticulitis   . GERD (gastroesophageal reflux disease)   . Hyperlipidemia   . Hypertension   . Osteoporosis   . Pain October 28, 2013   Left Lower quadrant / back  . Thrombocytopenia [facility])     Social History Social History   Tobacco Use  . Smoking status: Current Every Day Smoker    Packs/day: 0.12    Years: 43.00    Pack years: 5.16    Types: Cigarettes  . Smokeless tobacco: Never Used  . Tobacco comment: 2.5-5 cigarettes per day  Substance Use Topics  . Alcohol use: Yes    Alcohol/week:  2.0 - 3.0 standard drinks    Types: 2 - 3 Standard drinks or equivalent per week    Comment: 06/24/2016 "might have a few drinks/year"  . Drug use: No    Family History Family History  Problem Relation Age of Onset  . Hypertension Mother   . Hyperlipidemia Mother     Past Surgical History:  Procedure Laterality Date  . ABDOMINAL AORTOGRAM W/LOWER EXTREMITY N/A 06/24/2016   Procedure: Abdominal Aortogram w/Lower Extremity;  Surgeon: Nada Libman, MD;  Location: [facility];  Service: Cardiovascular;  Laterality: N/A;  . ABDOMINAL HYSTERECTOMY    . APPENDECTOMY    . COLONOSCOPY  Aug. 4, 2014  . TUBAL LIGATION      Allergies  Allergen Reactions  . Ace Inhibitors Cough  . Trilipix [Choline Fenofibrate] Other (See Comments)    Muscle cramps, muscle stiffness  . Zocor [Simvastatin] Other (See Comments)    Myalgia    Current Outpatient Medications  Medication Sig Dispense Refill  . alum & mag hydroxide-simeth (MAALOX/MYLANTA) 200-200-20 MG/5ML suspension Take 30 mLs by mouth 2 (two) times daily as needed for indigestion or heartburn.    . Artificial Tear Solution (SOOTHE XP OP) Apply 1 drop to eye daily as needed (dry eyes).    [name] aspirin EC 81 MG tablet Take 81 mg by mouth daily.    . Calcium Citrate-Vitamin D (CALCIUM + D PO) Take 1 tablet by mouth 3 (three) times a week.    . clopidogrel (PLAVIX) 75 MG tablet Take 1 tablet (75 mg total) by mouth daily. 30 tablet 11  . diphenhydrAMINE (BENADRYL) 25 MG tablet Take 25 mg by mouth at bedtime as needed for allergies.    [name] ezetimibe (ZETIA) 10 MG tablet Take 10 mg by mouth at bedtime.     [name] ibuprofen (ADVIL,MOTRIN) 200 MG tablet Take 400 mg by mouth 2 (two) times daily as needed for headache or moderate pain.    . Lidocaine (ASPERCREME LIDOCAINE) 4 % PTCH Apply 1 patch topically daily as needed (pain).    [name] losartan-hydrochlorothiazide (HYZAAR) 100-25 MG per tablet Take 1 tablet by mouth daily.    . metoprolol tartrate (LOPRESSOR) 25 MG tablet Take 25 mg by mouth every morning.     . Omega-3 Fatty Acids (FISH OIL) 1200 MG CAPS Take 1,200 mg by mouth daily.    [name] oxymetazoline (AFRIN) 0.05 % nasal spray Place 1 spray into both nostrils 2 (two) times daily as needed for congestion.    [name] Phenylephrine-Acetaminophen (SUDAFED PE SINUS HEADACHE MAX PO) Take 1 tablet by mouth 2 (two) times daily as needed (allergies).    . trolamine salicylate (ASPERCREME) 10 % cream Apply 1  application topically as needed for muscle pain.     No current facility-administered medications for this visit.     ROS: See HPI for pertinent positives and negatives.   Physical Examination  Vitals:   02/02/18 1125  BP: (!) 161/66  Pulse: (!) 46  Resp: 16  Temp: (!) 97 F (36.1 C)  TempSrc: Oral  SpO2: 100%  Weight: 125 lb (56.7 kg)  Height: 5\' 5"  (1.651 m)   Body mass index is 20.8 kg/m.  General: A&O x 3, WDWN female. Gait: normal HENT: No gross abnormalities  Eyes: PERRLA. Pulmonary: Respirations are non labored, CTAB, good air movement Cardiac: regular rhythm and rate, no detected murmur.    Carotid Bruits Right Left   Negative Negative   Radial pulses are 1+palpable bilaterally  Adominal aortic pulse is notpalpable   VASCULAR EXAM: Extremitieswithoutischemic changes, withoutGangrene; withoutopen wounds.  LE Pulses Right Left  FEMORAL 2+palpable 1+palpable   POPLITEAL notpalpable  notpalpable  POSTERIOR TIBIAL notpalpable  notpalpable   DORSALIS PEDIS ANTERIOR TIBIAL notpalpable  faintlypalpable    Abdomen: soft, NT, no palpable masses. Skin: no rashes, no cellulitis, no ulcers noted. Musculoskeletal: no muscle wasting or atrophy.    Neurologic: A&O X 3; appropriate affect, Sensation is normal; MOTOR FUNCTION:  moving all extremities equally, motor strength 5/5 throughout. Speech is fluent/normal. CN 2-12 intact. Psychiatric: Thought content is normal, mood appropriate for clinical situation.     ASSESSMENT: Carla Romero is a 72 y.o. female who is s/pthrombectomy and atherectomy of left superficial femoral artery acute/subacute occlusion, followed by drug coated balloon angioplasty using a 5 mm balloon on 06-24-16. There was residual dissection that did not respond to prolonged balloon inflation, and therefore this was treated with a 6 x 80 self-expanding  stent.  She has significant relief of left leg claudication symptoms. She has moderate claudication in her right lower extremity that is not life limiting for her.   Her atherosclerotic risk factors include current smoker (1/4 ppd), CAD, and COPD. Fortunately she does not have DM and her BMI is normal.  She is performing a graduated walking program.    DATA  Left LE Arterial Duplex (02-02-18): Highest velocity is 299 cm/s at the mid popliteal (was (318 cm/s), all biphasic waveforms.  Stable compared to the exam on 08-03-17.   ABI (Date: 02/02/2018):  R:   ABI: 0.80 (was 0.68 on 08-03-17),   PT: mono (was bi)  DP: mono (was bi)  TBI:  0.46, toe pressure 71, (was 0.61)  L:   ABI: 1.06 (was 1.02),   PT: tri (was bi)  DP: tri (was bi)  TBI: 0.99, (was 0.86) Improved ABI in right LE to mild disease, however, waveform quality has decline from bi to monophasic. Left ABI remains normal with triphasic waveforms.  Decline in right TBI, improved left TBI.    PLAN:  Continue walking at lest 30 minutes daily in a safe environment.   The patient was counseled re smoking cessation and given several free resources re smoking cessation.  Based on the patient's vascular studies and examination, pt will return to clinic in1 yearwith ABI's and left LE arterial duplex. Pt requested to be seen yearly in October, this seems reasonable based on her symptoms, physical exam results, and non invasive vascular study results from today.  I advised pt to notify us if she develops concerns re the circulation in her feet or legs    I discussed in depth with the patient the nature of atherosclerosis, and emphasized the importance of maximal medical management including strict control of blood pressure, blood glucose, and lipid levels, obtaining regular exercise, and cessation of smoking.  The patient is aware that without maximal medical management the underlying atherosclerotic  disease process will progress, limiting the benefit of any interventions.  The patient was given information about PAD including signs, symptoms, treatment, what symptoms should prompt the patient to seek immediate medical care, and risk reduction measures to take.  Charisse March, RN, MSN, FNP-C [facility] [place] Phone: [phone]  Clinic MD: Bonnye Fava  02/02/18 12:23 PM

## 2018-02-02 NOTE — Patient Instructions (Signed)
Steps to Quit Smoking Smoking tobacco can be bad for your health. It can also affect almost every organ in your body. Smoking puts you and people around you at risk for many serious long-lasting (chronic) diseases. Quitting smoking is hard, but it is one of the best things that you can do for your health. It is never too late to quit. What are the benefits of quitting smoking? When you quit smoking, you lower your risk for getting serious diseases and conditions. They can include:  Lung cancer or lung disease.  Heart disease.  Stroke.  Heart attack.  Not being able to have children (infertility).  Weak bones (osteoporosis) and broken bones (fractures).  If you have coughing, wheezing, and shortness of breath, those symptoms may get better when you quit. You may also get sick less often. If you are pregnant, quitting smoking can help to lower your chances of having a baby of low birth weight. What can I do to help me quit smoking? Talk with your doctor about what can help you quit smoking. Some things you can do (strategies) include:  Quitting smoking totally, instead of slowly cutting back how much you smoke over a period of time.  Going to in-person counseling. You are more likely to quit if you go to many counseling sessions.  Using resources and support systems, such as: ? Online chats with a counselor. ? Phone quitlines. ? Printed self-help materials. ? Support groups or group counseling. ? Text messaging programs. ? Mobile phone apps or applications.  Taking medicines. Some of these medicines may have nicotine in them. If you are pregnant or breastfeeding, do not take any medicines to quit smoking unless your doctor says it is okay. Talk with your doctor about counseling or other things that can help you.  Talk with your doctor about using more than one strategy at the same time, such as taking medicines while you are also going to in-person counseling. This can help make  quitting easier. What things can I do to make it easier to quit? Quitting smoking might feel very hard at first, but there is a lot that you can do to make it easier. Take these steps:  Talk to your family and friends. Ask them to support and encourage you.  Call phone quitlines, reach out to support groups, or work with a counselor.  Ask people who smoke to not smoke around you.  Avoid places that make you want (trigger) to smoke, such as: ? Bars. ? Parties. ? Smoke-break areas at work.  Spend time with people who do not smoke.  Lower the stress in your life. Stress can make you want to smoke. Try these things to help your stress: ? Getting regular exercise. ? Deep-breathing exercises. ? Yoga. ? Meditating. ? Doing a body scan. To do this, close your eyes, focus on one area of your body at a time from head to toe, and notice which parts of your body are tense. Try to relax the muscles in those areas.  Download or buy apps on your mobile phone or tablet that can help you stick to your quit plan. There are many free apps, such as QuitGuide from the CDC (Centers for Disease Control and Prevention). You can find more support from smokefree.gov and other websites.  This information is not intended to replace advice given to you by your health care provider. Make sure you discuss any questions you have with your health care provider. Document Released: 02/01/2009 Document   Revised: 12/04/2015 Document Reviewed: 08/22/2014 Elsevier Interactive Patient Education  2018 Elsevier Inc.     Peripheral Vascular Disease Peripheral vascular disease (PVD) is a disease of the blood vessels that are not part of your heart and brain. A simple term for PVD is poor circulation. In most cases, PVD narrows the blood vessels that carry blood from your heart to the rest of your body. This can result in a decreased supply of blood to your arms, legs, and internal organs, like your stomach or kidneys.  However, it most often affects a person's lower legs and feet. There are two types of PVD.  Organic PVD. This is the more common type. It is caused by damage to the structure of blood vessels.  Functional PVD. This is caused by conditions that make blood vessels contract and tighten (spasm).  Without treatment, PVD tends to get worse over time. PVD can also lead to acute ischemic limb. This is when an arm or limb suddenly has trouble getting enough blood. This is a medical emergency. Follow these instructions at home:  Take medicines only as told by your doctor.  Do not use any tobacco products, including cigarettes, chewing tobacco, or electronic cigarettes. If you need help quitting, ask your doctor.  Lose weight if you are overweight, and maintain a healthy weight as told by your doctor.  Eat a diet that is low in fat and cholesterol. If you need help, ask your doctor.  Exercise regularly. Ask your doctor for some good activities for you.  Take good care of your feet. ? Wear comfortable shoes that fit well. ? Check your feet often for any cuts or sores. Contact a doctor if:  You have cramps in your legs while walking.  You have leg pain when you are at rest.  You have coldness in a leg or foot.  Your skin changes.  You are unable to get or have an erection (erectile dysfunction).  You have cuts or sores on your feet that are not healing. Get help right away if:  Your arm or leg turns cold and blue.  Your arms or legs become red, warm, swollen, painful, or numb.  You have chest pain or trouble breathing.  You suddenly have weakness in your face, arm, or leg.  You become very confused or you cannot speak.  You suddenly have a very bad headache.  You suddenly cannot see. This information is not intended to replace advice given to you by your health care provider. Make sure you discuss any questions you have with your health care provider. Document Released:  07/02/2009 Document Revised: 09/13/2015 Document Reviewed: 09/15/2013 Elsevier Interactive Patient Education  2017 Elsevier Inc.  

## 2018-03-30 DIAGNOSIS — Z6821 Body mass index (BMI) 21.0-21.9, adult: Secondary | ICD-10-CM | POA: Diagnosis not present

## 2018-03-30 DIAGNOSIS — Z0000 Encounter for general adult medical examination without abnormal findings: Secondary | ICD-10-CM | POA: Diagnosis not present

## 2018-03-30 DIAGNOSIS — Z23 Encounter for immunization: Secondary | ICD-10-CM | POA: Diagnosis not present

## 2018-04-08 DIAGNOSIS — R799 Abnormal finding of blood chemistry, unspecified: Secondary | ICD-10-CM | POA: Diagnosis not present

## 2018-04-27 DIAGNOSIS — I10 Essential (primary) hypertension: Secondary | ICD-10-CM | POA: Diagnosis not present

## 2018-04-27 DIAGNOSIS — R799 Abnormal finding of blood chemistry, unspecified: Secondary | ICD-10-CM | POA: Diagnosis not present

## 2018-04-27 DIAGNOSIS — H00014 Hordeolum externum left upper eyelid: Secondary | ICD-10-CM | POA: Diagnosis not present

## 2018-05-10 DIAGNOSIS — I10 Essential (primary) hypertension: Secondary | ICD-10-CM | POA: Diagnosis not present

## 2018-06-30 DIAGNOSIS — R799 Abnormal finding of blood chemistry, unspecified: Secondary | ICD-10-CM | POA: Diagnosis not present

## 2018-07-09 DIAGNOSIS — I10 Essential (primary) hypertension: Secondary | ICD-10-CM | POA: Diagnosis not present

## 2018-07-09 DIAGNOSIS — R51 Headache: Secondary | ICD-10-CM | POA: Diagnosis not present

## 2018-09-29 DIAGNOSIS — I70212 Atherosclerosis of native arteries of extremities with intermittent claudication, left leg: Secondary | ICD-10-CM | POA: Diagnosis not present

## 2018-09-29 DIAGNOSIS — E782 Mixed hyperlipidemia: Secondary | ICD-10-CM | POA: Diagnosis not present

## 2018-09-29 DIAGNOSIS — F430 Acute stress reaction: Secondary | ICD-10-CM | POA: Diagnosis not present

## 2018-09-29 DIAGNOSIS — I10 Essential (primary) hypertension: Secondary | ICD-10-CM | POA: Diagnosis not present

## 2018-11-05 DIAGNOSIS — H268 Other specified cataract: Secondary | ICD-10-CM | POA: Diagnosis not present

## 2018-11-05 DIAGNOSIS — H40003 Preglaucoma, unspecified, bilateral: Secondary | ICD-10-CM | POA: Diagnosis not present

## 2018-11-05 DIAGNOSIS — H25813 Combined forms of age-related cataract, bilateral: Secondary | ICD-10-CM | POA: Diagnosis not present

## 2018-11-25 DIAGNOSIS — M545 Low back pain: Secondary | ICD-10-CM | POA: Diagnosis not present

## 2018-12-29 DIAGNOSIS — H40003 Preglaucoma, unspecified, bilateral: Secondary | ICD-10-CM | POA: Diagnosis not present

## 2019-01-08 DIAGNOSIS — J0100 Acute maxillary sinusitis, unspecified: Secondary | ICD-10-CM | POA: Diagnosis not present

## 2019-03-10 ENCOUNTER — Other Ambulatory Visit: Payer: Self-pay

## 2019-03-10 DIAGNOSIS — I779 Disorder of arteries and arterioles, unspecified: Secondary | ICD-10-CM

## 2019-03-14 ENCOUNTER — Ambulatory Visit (INDEPENDENT_AMBULATORY_CARE_PROVIDER_SITE_OTHER)
Admission: RE | Admit: 2019-03-14 | Discharge: 2019-03-14 | Disposition: A | Discharge status: Home / Self Care | Payer: PPO | Source: Ambulatory Visit | Attending: Family | Admitting: Family

## 2019-03-14 ENCOUNTER — Ambulatory Visit: Payer: PPO | Admitting: Family

## 2019-03-14 ENCOUNTER — Ambulatory Visit (HOSPITAL_COMMUNITY)
Admission: RE | Admit: 2019-03-14 | Discharge: 2019-03-14 | Disposition: A | Discharge status: Home / Self Care | Payer: PPO | Source: Ambulatory Visit | Attending: Family | Admitting: Family

## 2019-03-14 ENCOUNTER — Telehealth: Payer: Self-pay | Admitting: *Deleted

## 2019-03-14 ENCOUNTER — Encounter: Payer: Self-pay | Admitting: Family

## 2019-03-14 ENCOUNTER — Other Ambulatory Visit: Payer: Self-pay

## 2019-03-14 VITALS — BP 163/70 | HR 65 | Temp 97.4°F | Resp 14 | Ht 64.0 in | Wt 109.0 lb

## 2019-03-14 DIAGNOSIS — Z95820 Peripheral vascular angioplasty status with implants and grafts: Secondary | ICD-10-CM

## 2019-03-14 DIAGNOSIS — F17200 Nicotine dependence, unspecified, uncomplicated: Secondary | ICD-10-CM

## 2019-03-14 DIAGNOSIS — I779 Disorder of arteries and arterioles, unspecified: Secondary | ICD-10-CM

## 2019-03-14 NOTE — Progress Notes (Signed)
Virtual Visit via Telephone Note   I connected with Carla Romero on 03/14/2019 using the [employer] by telephone and verified that I was speaking with the correct person using two identifiers. Patient was located at her home and accompanied by herself. I am located at the [facility] office/clinic.   The limitations of evaluation and management by telemedicine and the availability of in person appointments have been previously discussed with the patient and are documented in the patients chart. The patient expressed understanding and consented to proceed.  PCP: Blane Ohara, MD  Chief Complaint: follow up peripheral artery occlusive disease   History of Present Illness: Carla Romero is a 73 y.o. female whom Dr. Daine Gip following for claudication since 2017. We have tried noninvasive measures such as medications and exercise programs, however these have not been successful, and the patient desired pursuing arteriogram with intervention.   On 06/24/2016 she underwent angiography which revealed a left superficial femoral artery acute/subacute occlusion. A jet stream device was used for thrombectomy and atherectomy of this area, followed by drug coated balloon angioplasty using a 5 mm balloon. There was residual dissection that did not respond to prolonged balloon inflation, and therefore this was treated with a 6 x 80 self-expanding stent.  After walking about 30 minutes her right calf starts to hurt, her toes at theplantar aspect of right foot feel numb after walking about 1/8 mile; she feels thisis stable,not life limiting for her.  She denies any problems in her left leg with walking or otherwise.   Dr. Margaretmary Bayley evaluated pt on 07-28-16. At that time duplex of the left legshowedtriphasic waveforms throughout without any significant stenosis Bilateral claudication, left greater than right: The patient has undergone successful revascularization of the left leg. Her  symptoms of the left leg hadcompletely resolved. She didnot feel that the right leg symptoms warrantedfurther attention at thattime. She wasplaced on surveillance protocol. Dr. Elmarie Mainland to continue taking her aspirin and Plavix for at least 3 months. If she has difficulty tolerating the Plavix, she could stop it at that time. If she is not having difficulty, Dr. Leretha Pol her Plavix in addition to her aspirin.  She fell a short distance off a ladder and fractured her left wrist, had an ORIF of left wrist 12-08-16,this has healed.  She walks 20 minutes daily, 6 days/week.   Diabetic: No Tobacco use: current smoker (4-5 cigs/day, started in her 20's), pt states she stresses over small things  Pt meds include: Statin :No, myalgia reaction to simvastatin. She is taking Red Yeast Rice, Co-Q10, and Red Krill oil.  Betablocker: Yes ASA: Yes Other anticoagulants/antiplatelets: Plavix. She is not having excessive bruising or bleedingissues Pt states her PCP strongly advises her to take Xarelto for PAD, but pt states that she cannot afford $500/month for it.   Past Medical History:  Diagnosis Date   Acute viral syndrome    Anemia    Vit D deficiency   Arteritis, unspecified (HCC) November 01, 2013   Arthritis    "fingers, knees, upper spine" (06/24/2016)   B12 deficiency anemia    "had injections in the 1970s"   CAD (coronary artery disease)    COPD (chronic obstructive pulmonary disease) (HCC)    "no one has ever told me I have this" (06/24/2016)   Diverticulitis    GERD (gastroesophageal reflux disease)    Hyperlipidemia    Hypertension    Osteoporosis    Pain October 28, 2013   Left Lower quadrant /  back   Thrombocytopenia [facility])     Past Surgical History:  Procedure Laterality Date   ABDOMINAL AORTOGRAM W/LOWER EXTREMITY N/A 06/24/2016   Procedure: Abdominal Aortogram w/Lower Extremity;  Surgeon: Serafina Mitchell, MD;  Location:  [facility];  Service: Cardiovascular;  Laterality: N/A;   ABDOMINAL HYSTERECTOMY     APPENDECTOMY     COLONOSCOPY  Aug. 4, 2014   TUBAL LIGATION      Current Meds  Medication Sig   alum & mag hydroxide-simeth (MAALOX/MYLANTA) 200-200-20 MG/5ML suspension Take 30 mLs by mouth 2 (two) times daily as needed for indigestion or heartburn.   Artificial Tear Solution (SOOTHE XP OP) Apply 1 drop to eye daily as needed (dry eyes).   aspirin EC 81 MG tablet Take 81 mg by mouth daily.   Calcium Citrate-Vitamin D (CALCIUM + D PO) Take 1 tablet by mouth 3 (three) times a week.   clopidogrel (PLAVIX) 75 MG tablet Take 1 tablet (75 mg total) by mouth daily.   diphenhydrAMINE (BENADRYL) 25 MG tablet Take 25 mg by mouth at bedtime as needed for allergies.   ezetimibe (ZETIA) 10 MG tablet Take 10 mg by mouth at bedtime.    ibuprofen (ADVIL,MOTRIN) 200 MG tablet Take 400 mg by mouth 2 (two) times daily as needed for headache or moderate pain.   Lidocaine (ASPERCREME LIDOCAINE) 4 % PTCH Apply 1 patch topically daily as needed (pain).   losartan-hydrochlorothiazide (HYZAAR) 100-25 MG per tablet Take 1 tablet by mouth daily.   metoprolol tartrate (LOPRESSOR) 25 MG tablet Take 25 mg by mouth every morning.    Omega-3 Fatty Acids (FISH OIL) 1200 MG CAPS Take 1,200 mg by mouth daily.   oxymetazoline (AFRIN) 0.05 % nasal spray Place 1 spray into both nostrils 2 (two) times daily as needed for congestion.   Phenylephrine-Acetaminophen (SUDAFED PE SINUS HEADACHE MAX PO) Take 1 tablet by mouth 2 (two) times daily as needed (allergies).   trolamine salicylate (ASPERCREME) 10 % cream Apply 1 application topically as needed for muscle pain.    12 system ROS was negative unless otherwise noted in HPI   Observations/Objective:  DATA  Left LE Arterial Duplex (03-14-19): +-----------+--------+-----+---------------+----------+---------+  LEFT        PSV cm/s Ratio Stenosis        Waveform    Comments   +-----------+--------+-----+---------------+----------+---------+  CFA Distal  135                            triphasic             +-----------+--------+-----+---------------+----------+---------+  DFA         141                            biphasic              +-----------+--------+-----+---------------+----------+---------+  SFA Prox    123                            triphasic             +-----------+--------+-----+---------------+----------+---------+  SFA Mid     140                            triphasic  see stent  +-----------+--------+-----+---------------+----------+---------+  SFA Distal  49  biphasic   see stent  +-----------+--------+-----+---------------+----------+---------+  POP Prox    167            30-49% stenosis triphasic             +-----------+--------+-----+---------------+----------+---------+  POP Distal  57                             biphasic              +-----------+--------+-----+---------------+----------+---------+  ATA Distal  42                             triphasic             +-----------+--------+-----+---------------+----------+---------+  PTA Distal  42                             biphasic              +-----------+--------+-----+---------------+----------+---------+  PERO Distal                                monophasic            +-----------+--------+-----+---------------+----------+---------+    Left Stent(s): +-------------------+--------+--------+---------+--------+  mid SFA to prox pop PSV cm/s Stenosis Waveform  Comments  +-------------------+--------+--------+---------+--------+  Prox to Stent       132               triphasic           +-------------------+--------+--------+---------+--------+  Proximal Stent      147               biphasic            +-------------------+--------+--------+---------+--------+  Mid Stent           73                biphasic             +-------------------+--------+--------+---------+--------+  Distal Stent        50                biphasic            +-------------------+--------+--------+---------+--------+  Distal to Stent     49                biphasic            +-------------------+--------+--------+---------+--------+ Summary: Left: Patent stent with no evidence of stenosis. 30-49% stenosis in the proximal popliteal artery.    Left LE Arterial Duplex (02-02-18): Highest velocity is 299 cm/s at the mid popliteal (was (318 cm/s), all biphasic waveforms.  Stable compared to the exam on 08-03-17.   ABI Findings (03-14-19): +---------+------------------+-----+----------+--------+  Right     Rt Pressure (mmHg) Index Waveform   Comment   +---------+------------------+-----+----------+--------+  Brachial  165                                           +---------+------------------+-----+----------+--------+  PTA       114                0.69  biphasic             +---------+------------------+-----+----------+--------+  DP        112                0.68  monophasic           +---------+------------------+-----+----------+--------+  Great Toe 89                 0.54                       +---------+------------------+-----+----------+--------+  +---------+------------------+-----+---------+-------+  Left      Lt Pressure (mmHg) Index Waveform  Comment  +---------+------------------+-----+---------+-------+  Brachial  160                                         +---------+------------------+-----+---------+-------+  PTA       149                0.90  triphasic          +---------+------------------+-----+---------+-------+  DP        163                0.99  triphasic          +---------+------------------+-----+---------+-------+  Great Toe 100                0.61                      +---------+------------------+-----+---------+-------+  +-------+-----------+-----------+------------+------------+  ABI/TBI Today's ABI Today's TBI Previous ABI Previous TBI  +-------+-----------+-----------+------------+------------+  Right   0.69        0.54        0.8          0.46          +-------+-----------+-----------+------------+------------+  Left    0.99        0.61        1.06         0.99          +-------+-----------+-----------+------------+------------+  Previous ABI 02/02/18.   Summary: Right: Resting right ankle-brachial index indicates moderate right lower extremity arterial disease. The right toe-brachial index is abnormal. RT great toe pressure = 89 mmHg.  Left: Resting left ankle-brachial index is within normal range. No evidence of significant left lower extremity arterial disease. The left toe-brachial index is abnormal. LT Great toe pressure = 100 mmHg.    ABI (Date: 02/02/2018):  R:  ? ABI: 0.80 (was 0.68 on 08-03-17),  ? PT: mono (was bi) ? DP: mono (was bi) ? TBI:  0.46, toe pressure 71, (was 0.61)  L:  ? ABI: 1.06 (was 1.02),  ? PT: tri (was bi) ? DP: tri (was bi) ? TBI: 0.99, (was 0.86) Improved ABI in right LE to mild disease, however, waveform quality has decline from bi to monophasic. Left ABI remains normal with triphasic waveforms.  Decline in right TBI, improved left TBI.    Assessment and Plan: Carla CraftsKathryn G Romero is a 73 y.o. female who is s/pthrombectomy and atherectomy of left superficial femoral artery acute/subacute occlusion, followed by drug coated balloon angioplasty using a 5 mm balloon on 06-24-16. There was residual dissection that did not respond to prolonged balloon inflation, and therefore this was treated with a 6 x 80 self-expanding stent.  Today's left lower extremity arterial duplex shows less stenosis than a year ago, bi and triphasic waveforms, no in stent stenosis.  ABI's with mild decline in the right to 0.69 from  0.8, mono and biphasic waveforms; remains norma in the left with triphasic waveforms.   She has significant relief of left leg claudication symptoms. She has moderate claudication in her right lower extremity that is not life limiting for her.   Her atherosclerotic risk factors include current smoker (1/4 ppd), CAD, and COPD. Fortunately she does not have DM and her BMI is normal.  She is performing a graduated walking program.    Follow Up Instructions:   Follow up in 1 year with ABI's and left LE arterial duplex.  Pt requested to be seen yearly in October, this seems reasonable based on her symptoms, physical exam results, and non invasive vascular study results from today.  I advised pt to notify us if she develops concerns re the circulation in her feet or legs  Continue walking at lest 30 minutes daily in a safe environment.   The patient was counseled re smoking cessation and given several free resources re smoking cessation.    I discussed the assessment and treatment plan with the patient. The patient was provided an opportunity to ask questions and all were answered. The patient agreed with the plan and demonstrated an understanding of the instructions.   The patient was advised to call back or seek an in-person evaluation if the symptoms worsen or if the condition fails to improve as anticipated.  I spent 9 minutes with the patient via telephone encounter.   Donnalee Curry Marcella Dunnaway [facility] Office: [phone]  03/14/2019, 4:53 PM

## 2019-03-14 NOTE — Patient Instructions (Signed)

## 2019-03-14 NOTE — Telephone Encounter (Signed)
Virtual Visit Pre-Appointment Phone Call  Today, I spoke with Carla Romero and performed the following actions:  1. I explained that we are currently trying to limit exposure to the COVID-19 virus by seeing patients at home rather than in the office.  I explained that the visits are best done by video, but can be done by telephone.  I asked the patient if a virtual visit that the patient would like to try instead of coming into the office. Carla Romero agreed to proceed with the virtual visit scheduled with [name]  on 03/14/19.    2. I confirmed the BEST phone number to call the day of the visit and- I included this in appointment notes.  3. I asked if the patient had access to (through a family member/friend) a smartphone with video capability to be used for her visit?"  The patient said yes -   4. I confirmed consent by  a. sending through [name] or by email the [place] as written at the end of this message or  b. verbally as listed below. i. This visit is being performed in the setting of COVID-19. ii. All virtual visits are billed to your insurance company just like a normal visit would be.   iii. We'd like you to understand that the technology does not allow for your provider to perform an examination, and thus may limit your provider's ability to fully assess your condition.  iv. If your provider identifies any concerns that need to be evaluated in person, we will make arrangements to do so.   v. Finally, though the technology is pretty good, we cannot assure that it will always work on either your or our end, and in the setting of a video visit, we may have to convert it to a phone-only visit.  In either situation, we cannot ensure that we have a secure connection.   vi. Are you willing to proceed?"  STAFF: Did the patient verbally acknowledge consent to telehealth visit? Document YES/NO here: YES  2. I advised the patient to be  prepared - I asked that the patient, on the day of her visit, record any information possible with the equipment at her home, such as blood pressure, pulse, oxygen saturation, and your weight and write them all down. I asked the patient to have a pen and paper handy nearby the day of the visit as well.  3. If the patient was scheduled for a video visit, I informed the patient that the visit with the doctor would start with a text to the smartphone # given to [name] by the patient.         If the patient was scheduled for a telephone call, I informed the patient that the visit with the doctor would start with a call to the telephone # given to [name] by the patient.  4. I Informed patient they will receive a phone call 15 minutes prior to their appointment time from a [place] or nurse to review medications, allergies, etc. to prepare for the visit.    TELEPHONE CALL NOTE  [name] has been deemed a candidate for a follow-up tele-health visit to limit community exposure during the Covid-19 pandemic. I spoke with the patient via phone to ensure availability of phone/video source, confirm preferred email & phone number, and discuss instructions and expectations.  I reminded [name] to be prepared with any vital sign and/or heart  rhythm information that could potentially be obtained via home monitoring, at the time of her visit. I reminded [name] to expect a phone call prior to her visit.  [name], NT 03/14/2019 12:04 PM     FULL LENGTH CONSENT FOR TELE-HEALTH VISIT   I hereby voluntarily request, consent and authorize [facility] and its employed or contracted physicians, physician assistants, nurse practitioners or other licensed health care professionals (the Practitioner), to provide me with telemedicine health care services (the "Services") as deemed necessary by the treating Practitioner. I acknowledge and consent to receive the Services by the Practitioner via  telemedicine. I understand that the telemedicine visit will involve communicating with the Practitioner through live audiovisual communication technology and the disclosure of certain medical information by electronic transmission. I acknowledge that I have been given the opportunity to request an in-person assessment or other available alternative prior to the telemedicine visit and am voluntarily participating in the telemedicine visit.  I understand that I have the right to withhold or withdraw my consent to the use of telemedicine in the course of my care at any time, without affecting my right to future care or treatment, and that the Practitioner or I may terminate the telemedicine visit at any time. I understand that I have the right to inspect all information obtained and/or recorded in the course of the telemedicine visit and may receive copies of available information for a reasonable fee.  I understand that some of the potential risks of receiving the Services via telemedicine include:  [name] Delay or interruption in medical evaluation due to technological equipment failure or disruption; . Information transmitted may not be sufficient (e.g. poor resolution of images) to allow for appropriate medical decision making by the Practitioner; and/or  . In rare instances, security protocols could fail, causing a breach of personal health information.  Furthermore, I acknowledge that it is my responsibility to provide information about my medical history, conditions and care that is complete and accurate to the best of my ability. I acknowledge that Practitioner's advice, recommendations, and/or decision may be based on factors not within their control, such as incomplete or inaccurate data provided by me or distortions of diagnostic images or specimens that may result from electronic transmissions. I understand that the practice of medicine is not an exact science and that Practitioner makes no warranties or  guarantees regarding treatment outcomes. I acknowledge that I will receive a copy of this consent concurrently upon execution via email to the email address I last provided but may also request a printed copy by calling the office of [facility].    I understand that my insurance will be billed for this visit.   I have read or had this consent read to me. . I understand the contents of this consent, which adequately explains the benefits and risks of the Services being provided via telemedicine.  . I have been provided ample opportunity to ask questions regarding this consent and the Services and have had my questions answered to my satisfaction. . I give my informed consent for the services to be provided through the use of telemedicine in my medical care  By participating in this telemedicine visit I agree to the above.

## 2019-04-05 ENCOUNTER — Other Ambulatory Visit: Payer: Self-pay

## 2019-04-05 DIAGNOSIS — I779 Disorder of arteries and arterioles, unspecified: Secondary | ICD-10-CM

## 2019-04-13 DIAGNOSIS — Z20828 Contact with and (suspected) exposure to other viral communicable diseases: Secondary | ICD-10-CM | POA: Diagnosis not present

## 2019-04-13 DIAGNOSIS — R05 Cough: Secondary | ICD-10-CM | POA: Diagnosis not present

## 2019-05-26 ENCOUNTER — Other Ambulatory Visit: Payer: Self-pay | Admitting: Family Medicine

## 2019-05-27 ENCOUNTER — Other Ambulatory Visit: Payer: Self-pay | Admitting: Family Medicine

## 2019-08-02 DIAGNOSIS — K5904 Chronic idiopathic constipation: Secondary | ICD-10-CM | POA: Diagnosis not present

## 2019-08-24 ENCOUNTER — Other Ambulatory Visit: Payer: Self-pay | Admitting: Family Medicine

## 2019-09-05 ENCOUNTER — Other Ambulatory Visit: Payer: Self-pay | Admitting: Family Medicine

## 2019-09-05 DIAGNOSIS — I10 Essential (primary) hypertension: Secondary | ICD-10-CM

## 2019-09-05 DIAGNOSIS — E782 Mixed hyperlipidemia: Secondary | ICD-10-CM

## 2019-10-03 ENCOUNTER — Other Ambulatory Visit: Payer: Self-pay | Admitting: Family Medicine

## 2019-10-04 DIAGNOSIS — K5904 Chronic idiopathic constipation: Secondary | ICD-10-CM | POA: Diagnosis not present

## 2019-10-10 NOTE — Progress Notes (Signed)
Subjective:  Patient ID: Carla Romero, female    DOB: [birth year]  Age: 74 y.o. MRN: [id number]  Chief Complaint  Patient presents with  . Annual Exam    HPI Encounter for general adult medical examination without abnormal findings  Physical ("At Risk" items are starred): Patient's last physical exam was 1 year ago .  Weight: Appropriate for height (BMI less than 27%) ;  Blood Pressure: Normal (BP less than 120/80) ;  Medical History: Patient history reviewed ; Family history reviewed ;  Allergies Reviewed: No change in current allergies ;  Medications Reviewed: Medications reviewed - no changes ;  Lipids: Normal lipid levels ;  Smoking: Life-long non-smoker ;  Physical Activity: Exercises at least 3 times per week ;  Alcohol/Drug Use: Is a non-drinker ; No illicit drug use ;  Patient is not afflicted from Stress Incontinence and Urge Incontinence  Safety: reviewed ; Patient wears a seat belt, has smoke detectors, has carbon monoxide detectors and wears sunscreen with extended sun exposure. Dental Care: biannual cleanings, brushes and flosses daily. Ophthalmology/Optometry: Annual visit.  Hearing loss: none Vision impairments: none  Colonoscopy was last done in 2014 per patient Refused Mammogram Refused CT Lung  Fall Risk  10/12/2019 10/11/2019  Falls in the past year? - 0  Number falls in past yr: - 0  Follow up Falls evaluation completed;Falls prevention discussed -     Depression screen [facility] 2/9 10/11/2019  Decreased Interest 0  Down, Depressed, Hopeless 0  PHQ - 2 Score 0       Functional Status Survey: Is the patient deaf or have difficulty hearing?: Yes Does the patient have difficulty seeing, even when wearing glasses/contacts?: Yes Does the patient have difficulty concentrating, remembering, or making decisions?: No Does the patient have difficulty walking or climbing stairs?: No Does the patient have difficulty dressing or bathing?: No Does the patient have  difficulty doing errands alone such as visiting a doctor's office or shopping?: No   Social Hx   Social History   Socioeconomic History  . Marital status: Widowed    Spouse name: Not on file  . Number of children: Not on file  . Years of education: Not on file  . Highest education level: Not on file  Occupational History  . Not on file  Tobacco Use  . Smoking status: Current Every Day Smoker    Packs/day: 0.12    Years: 43.00    Pack years: 5.16    Types: Cigarettes  . Smokeless tobacco: Never Used  . Tobacco comment: 2.5-5 cigarettes per day  Vaping Use  . Vaping Use: Never used  Substance and Sexual Activity  . Alcohol use: Yes    Alcohol/week: 2.0 - 3.0 standard drinks    Types: 2 - 3 Standard drinks or equivalent per week    Comment: 06/24/2016 "might have a few drinks/year"  . Drug use: No  . Sexual activity: Never  Other Topics Concern  . Not on file  Social History Narrative  . Not on file   Social Determinants of Health   Financial Resource Strain:   . Difficulty of Paying Living Expenses:   Food Insecurity:   . Worried About Programme researcher, broadcasting/film/video in the Last Year:   . Barista in the Last Year:   Transportation Needs:   . Freight forwarder (Medical):   [name] Lack of Transportation (Non-Medical):   Physical Activity:   . Days of Exercise per Week:   .  Minutes of Exercise per Session:   Stress:   . Feeling of Stress :   Social Connections:   . Frequency of Communication with Friends and Family:   . Frequency of Social Gatherings with Friends and Family:   . Attends Religious Services:   . Active Member of Clubs or Organizations:   . Attends Archivist Meetings:   [name] Marital Status:    Past Medical History:  Diagnosis Date  . Acute viral syndrome   . Anemia    Vit D deficiency  . Arteritis, unspecified ([place]) November 01, 2013  . Arthritis    "fingers, knees, upper spine" (06/24/2016)  . B12 deficiency anemia    "had injections in the  1970s"  . CAD (coronary artery disease)   . COPD (chronic obstructive pulmonary disease) ([place])    "no one has ever told me I have this" (06/24/2016)  . Diverticulitis   . GERD (gastroesophageal reflux disease)   . Hyperlipidemia   . Hypertension   . Osteoporosis   . Pain October 28, 2013   Left Lower quadrant / back  . Thrombocytopenia ([place])    Family History  Problem Relation Age of Onset  . Hypertension Mother   . Hyperlipidemia Mother     Review of Systems  Constitutional: Negative for chills, fatigue and fever.  HENT: Negative for congestion, ear pain, rhinorrhea and sore throat.   Respiratory: Negative for cough and shortness of breath.   Cardiovascular: Negative for chest pain.  Gastrointestinal: Negative for abdominal pain, constipation, diarrhea, nausea and vomiting.  Genitourinary: Negative for dysuria and urgency.  Musculoskeletal: Negative for back pain and myalgias.  Neurological: Negative for dizziness, weakness, light-headedness and headaches.  Psychiatric/Behavioral: Negative for dysphoric mood. The patient is not nervous/anxious.      Objective:  BP 128/76   Pulse 60   Temp 97.7 F (36.5 C)   Resp 14   Ht 5' 1.5" (1.562 m)   Wt 115 lb 6.4 oz (52.3 kg)   BMI 21.45 kg/m   BP/Weight 10/11/2019 03/14/2019 02/04/5101  Systolic BP 585 277 824  Diastolic BP 76 70 66  Wt. (Lbs) 115.4 109 125  BMI 21.45 18.71 20.8    Physical Exam Vitals reviewed.  Constitutional:      General: She is not in acute distress.    Appearance: Normal appearance. She is normal weight.  HENT:     Right Ear: Tympanic membrane and ear canal normal.     Left Ear: Tympanic membrane and ear canal normal.     Nose: Nose normal. No congestion or rhinorrhea.     Mouth/Throat:     Mouth: Mucous membranes are moist.  Eyes:     Conjunctiva/sclera: Conjunctivae normal.  Neck:     Thyroid: No thyroid mass.     Vascular: No carotid bruit.  Cardiovascular:     Rate and Rhythm: Normal  rate and regular rhythm.     Pulses: Normal pulses.     Heart sounds: Normal heart sounds. No murmur heard.   Pulmonary:     Effort: Pulmonary effort is normal. No respiratory distress.     Breath sounds: Normal breath sounds.  Abdominal:     General: Abdomen is flat. Bowel sounds are normal.     Palpations: Abdomen is soft. There is no mass.     Tenderness: There is no abdominal tenderness.  Musculoskeletal:        General: Normal range of motion.  Lymphadenopathy:     Cervical:  No cervical adenopathy.  Skin:    General: Skin is warm and dry.  Neurological:     Mental Status: She is alert and oriented to person, place, and time.     Cranial Nerves: No cranial nerve deficit.  Psychiatric:        Mood and Affect: Mood normal.        Behavior: Behavior normal.     Lab Results  Component Value Date   HGB 13.6 06/24/2016   HCT 40.0 06/24/2016   GLUCOSE 96 06/24/2016   NA 139 06/24/2016   K 3.4 (L) 06/24/2016   CL 100 (L) 06/24/2016   CREATININE 0.80 06/24/2016   BUN 10 06/24/2016      Assessment & Plan:  1. Annual physical exam 2. Cigarette nicotine dependence with nicotine-induced disorder   These are the goals we discussed: Goals   Eat healthy and exercise.     This is a list of the screening recommended for you and due dates:  Health Maintenance  Topic Date Due  .  Hepatitis C: One time screening is recommended by Center for Disease Control  (CDC) for  adults born from [birth year] through 1964.   Never done  . Tetanus Vaccine  Never done  . DEXA scan (bone density measurement)  Never done  . Pneumonia vaccines (2 of 2 - PPSV23) 03/27/2016  . Mammogram  10/11/2020*  . Flu Shot  11/20/2019  . Colon Cancer Screening  04/26/2022  . COVID-19 Vaccine  Completed  *Topic was postponed. The date shown is not the original due date.    Follow-up: Return in about 2 months (around 12/26/2019) for fasting. Blane Ohara [facility] [phone]

## 2019-10-11 ENCOUNTER — Ambulatory Visit (INDEPENDENT_AMBULATORY_CARE_PROVIDER_SITE_OTHER): Payer: PPO | Admitting: Family Medicine

## 2019-10-11 ENCOUNTER — Other Ambulatory Visit: Payer: Self-pay

## 2019-10-11 ENCOUNTER — Encounter: Payer: Self-pay | Admitting: Family Medicine

## 2019-10-11 VITALS — BP 128/76 | HR 60 | Temp 97.7°F | Resp 14 | Ht 61.5 in | Wt 115.4 lb

## 2019-10-11 DIAGNOSIS — Z0000 Encounter for general adult medical examination without abnormal findings: Secondary | ICD-10-CM

## 2019-10-11 DIAGNOSIS — F17219 Nicotine dependence, cigarettes, with unspecified nicotine-induced disorders: Secondary | ICD-10-CM | POA: Diagnosis not present

## 2019-10-11 NOTE — Patient Instructions (Signed)
Preventive Care 73 Years and Older, Female Preventive care refers to lifestyle choices and visits with your health care provider that can promote health and wellness. This includes:  A yearly physical exam. This is also called an annual well check.  Regular dental and eye exams.  Immunizations.  Screening for certain conditions.  Healthy lifestyle choices, such as diet and exercise. What can I expect for my preventive care visit? Physical exam Your health care provider will check:  Height and weight. These may be used to calculate body mass index (BMI), which is a measurement that tells if you are at a healthy weight.  Heart rate and blood pressure.  Your skin for abnormal spots. Counseling Your health care provider may ask you questions about:  Alcohol, tobacco, and drug use.  Emotional well-being.  Home and relationship well-being.  Sexual activity.  Eating habits.  History of falls.  Memory and ability to understand (cognition).  Work and work Statistician.  Pregnancy and menstrual history. What immunizations do I need?  Influenza (flu) vaccine  This is recommended every year. Tetanus, diphtheria, and pertussis (Tdap) vaccine  You may need a Td booster every 10 years. Varicella (chickenpox) vaccine  You may need this vaccine if you have not already been vaccinated. Zoster (shingles) vaccine  You may need this after age 73. Pneumococcal conjugate (PCV13) vaccine  One dose is recommended after age 73. Pneumococcal polysaccharide (PPSV23) vaccine  One dose is recommended after age 72. Measles, mumps, and rubella (MMR) vaccine  You may need at least one dose of MMR if you were born in 1957 or later. You may also need a second dose. Meningococcal conjugate (MenACWY) vaccine  You may need this if you have certain conditions. Hepatitis A vaccine  You may need this if you have certain conditions or if you travel or work in places where you may be exposed  to hepatitis A. Hepatitis B vaccine  You may need this if you have certain conditions or if you travel or work in places where you may be exposed to hepatitis B. Haemophilus influenzae type b (Hib) vaccine  You may need this if you have certain conditions. You may receive vaccines as individual doses or as more than one vaccine together in one shot (combination vaccines). Talk with your health care provider about the risks and benefits of combination vaccines. What tests do I need? Blood tests  Lipid and cholesterol levels. These may be checked every 5 years, or more frequently depending on your overall health.  Hepatitis C test.  Hepatitis B test. Screening  Lung cancer screening. You may have this screening every year starting at age 73 if you have a 30-pack-year history of smoking and currently smoke or have quit within the past 15 years.  Colorectal cancer screening. All adults should have this screening starting at age 73 and continuing until age 15. Your health care provider may recommend screening at age 73 if you are at increased risk. You will have tests every 1-10 years, depending on your results and the type of screening test.  Diabetes screening. This is done by checking your blood sugar (glucose) after you have not eaten for a while (fasting). You may have this done every 1-3 years.  Mammogram. This may be done every 1-2 years. Talk with your health care provider about how often you should have regular mammograms.  BRCA-related cancer screening. This may be done if you have a family history of breast, ovarian, tubal, or peritoneal cancers.  Other tests  Sexually transmitted disease (STD) testing.  Bone density scan. This is done to screen for osteoporosis. You may have this done starting at age 73. Follow these instructions at home: Eating and drinking  Eat a diet that includes fresh fruits and vegetables, whole grains, lean protein, and low-fat dairy products. Limit  your intake of foods with high amounts of sugar, saturated fats, and salt.  Take vitamin and mineral supplements as recommended by your health care provider.  Do not drink alcohol if your health care provider tells you not to drink.  If you drink alcohol: ? Limit how much you have to 0-1 drink a day. ? Be aware of how much alcohol is in your drink. In the [place] one drink equals one 12 oz bottle of beer (355 mL), one 5 oz glass of wine (148 mL), or one 1 oz glass of hard liquor (44 mL). Lifestyle  Take daily care of your teeth and gums.  Stay active. Exercise for at least 30 minutes on 5 or more days each week.  Do not use any products that contain nicotine or tobacco, such as cigarettes, e-cigarettes, and chewing tobacco. If you need help quitting, ask your health care provider.  If you are sexually active, practice safe sex. Use a condom or other form of protection in order to prevent STIs (sexually transmitted infections).  Talk with your health care provider about taking a low-dose aspirin or statin. What's next?  Go to your health care provider once a year for a well check visit.  Ask your health care provider how often you should have your eyes and teeth checked.  Stay up to date on all vaccines. This information is not intended to replace advice given to you by your health care provider. Make sure you discuss any questions you have with your health care provider. Document Revised: 04/01/2018 Document Reviewed: 04/01/2018 Elsevier Patient Education  2020 [employer].

## 2019-10-12 ENCOUNTER — Encounter: Payer: Self-pay | Admitting: Family Medicine

## 2019-10-12 ENCOUNTER — Other Ambulatory Visit: Payer: Self-pay | Admitting: Family Medicine

## 2019-10-12 DIAGNOSIS — F17219 Nicotine dependence, cigarettes, with unspecified nicotine-induced disorders: Secondary | ICD-10-CM | POA: Insufficient documentation

## 2019-10-12 DIAGNOSIS — Z0000 Encounter for general adult medical examination without abnormal findings: Secondary | ICD-10-CM | POA: Insufficient documentation

## 2019-11-02 DIAGNOSIS — H268 Other specified cataract: Secondary | ICD-10-CM | POA: Diagnosis not present

## 2019-11-02 DIAGNOSIS — H40003 Preglaucoma, unspecified, bilateral: Secondary | ICD-10-CM | POA: Diagnosis not present

## 2019-11-02 DIAGNOSIS — H25813 Combined forms of age-related cataract, bilateral: Secondary | ICD-10-CM | POA: Diagnosis not present

## 2019-11-24 ENCOUNTER — Other Ambulatory Visit: Payer: Self-pay | Admitting: Family Medicine

## 2019-12-12 NOTE — Progress Notes (Signed)
Subjective:  Patient ID: Carla Romero, female    DOB: 21-Mar-1946  Age: 74 y.o. MRN: [id number]  Chief Complaint  Patient presents with   Hyperlipidemia   Hypertension   Anxiety   Dysuria    HPI  Pt presents for follow up of hypertension.  Date of diagnosis 2010.  Her current cardiac medication regimen includes a calcium channel blocker ( Norvasc 5 mg QD ) and an angiotensin receptor blocker ( Diovan 320 mg once daily ).  Checks it when she gets a headache which is infrequent (less than monthly.) At the time her bp may go up to sbp 160s.   Sometimes she takes an extra bp pill, either amlodipine or valsartan. She is tolerating the medication well without side effects.  Compliance with treatment has been good; she takes her medication as directed, maintains her diet and exercise regimen, and follows up as directed.      Pt presents with hyperlipidemia.  Date of diagnosis 2010.  Current treatment includes Zetia 10 mg QD, flax seed oil,  and a low cholesterol/low fat diet.  Compliance with treatment has been good; she takes her medication as directed, maintains her low cholesterol diet, follows up as directed, and maintains her exercise regimen.  Patient has refused to take statins.   [name] presents with a diagnosis of atherosclerosis of native arteries of extremities with intermittent claudication, left leg.  This was diagnosed 03/2016.  The course has been improved with stent in left superficial femoral artery. Symptoms are relieved with Plavix and surgery.   She denies chest pain or paresthesias.  Being followed by [facility] in [place].      Dysuria- states after she urinates she has a burning sensation. She does have this issue every time she urinates however it has been a issue for the past 2 months.  Current Outpatient Medications on File Prior to Visit  Medication Sig Dispense Refill   amLODipine (NORVASC) 5 MG tablet TAKE 1 TABLET BY MOUTH ONCE DAILY 90  tablet 0   Artificial Tear Solution (SOOTHE XP OP) Apply 1 drop to eye daily as needed (dry eyes).     aspirin EC 81 MG tablet Take 81 mg by mouth daily.     clopidogrel (PLAVIX) 75 MG tablet TAKE 1 TABLET BY MOUTH ONCE DAILY. 30 tablet 10   diphenhydrAMINE (BENADRYL) 25 MG tablet Take 25 mg by mouth at bedtime as needed for allergies.     ezetimibe (ZETIA) 10 MG tablet TAKE 1 TABLET BY MOUTH AT BEDTIME 90 tablet 0   ibuprofen (ADVIL,MOTRIN) 200 MG tablet Take 400 mg by mouth 2 (two) times daily as needed for headache or moderate pain.     Lidocaine (ASPERCREME LIDOCAINE) 4 % PTCH Apply 1 patch topically daily as needed (pain).     Omega-3 Fatty Acids (FISH OIL) 1200 MG CAPS Take 1,200 mg by mouth daily.     trolamine salicylate (ASPERCREME) 10 % cream Apply 1 application topically as needed for muscle pain.     valsartan (DIOVAN) 320 MG tablet TAKE 1 TABLET BY MOUTH ONCE DAILY 90 tablet 1   alum & mag hydroxide-simeth (MAALOX/MYLANTA) 200-200-20 MG/5ML suspension Take 30 mLs by mouth 2 (two) times daily as needed for indigestion or heartburn. (Patient not taking: Reported on 12/13/2019)     Calcium Citrate-Vitamin D (CALCIUM + D PO) Take 1 tablet by mouth 3 (three) times a week.     No current facility-administered medications on file prior  to visit.   Past Medical History:  Diagnosis Date   Acute viral syndrome    Anemia    Vit D deficiency   Arteritis, unspecified (HCC) November 01, 2013   Arthritis    "fingers, knees, upper spine" (06/24/2016)   B12 deficiency anemia    "had injections in the 1970s"   CAD (coronary artery disease)    COPD (chronic obstructive pulmonary disease) (HCC)    "no one has ever told me I have this" (06/24/2016)   Diverticulitis    GERD (gastroesophageal reflux disease)    Hyperlipidemia    Hypertension    Osteoporosis    Pain October 28, 2013   Left Lower quadrant / back   Thrombocytopenia [facility])    Past Surgical History:  Procedure  Laterality Date   ABDOMINAL AORTOGRAM W/LOWER EXTREMITY N/A 06/24/2016   Procedure: Abdominal Aortogram w/Lower Extremity;  Surgeon: Nada Libman, MD;  Location: [facility];  Service: Cardiovascular;  Laterality: N/A;   ABDOMINAL HYSTERECTOMY     APPENDECTOMY     COLONOSCOPY  Aug. 4, 2014   TUBAL LIGATION      Family History  Problem Relation Age of Onset   Hypertension Mother    Hyperlipidemia Mother    Social History   Socioeconomic History   Marital status: Widowed    Spouse name: Not on file   Number of children: Not on file   Years of education: Not on file   Highest education level: Not on file  Occupational History   Not on file  Tobacco Use   Smoking status: Current Every Day Smoker    Packs/day: 0.12    Years: 43.00    Pack years: 5.16    Types: Cigarettes   Smokeless tobacco: Never Used   Tobacco comment: 2.5-5 cigarettes per day  Vaping Use   Vaping Use: Never used  Substance and Sexual Activity   Alcohol use: Yes    Alcohol/week: 2.0 - 3.0 standard drinks    Types: 2 - 3 Standard drinks or equivalent per week    Comment: 06/24/2016 "might have a few drinks/year"   Drug use: No   Sexual activity: Never  Other Topics Concern   Not on file  Social History Narrative   Not on file   Social Determinants of Health   Financial Resource Strain:    Difficulty of Paying Living Expenses: Not on file  Food Insecurity:    Worried About Running Out of Food in the Last Year: Not on file   [employer] of Food in the Last Year: Not on file  Transportation Needs:    Lack of Transportation (Medical): Not on file   Lack of Transportation (Non-Medical): Not on file  Physical Activity:    Days of Exercise per Week: Not on file   Minutes of Exercise per Session: Not on file  Stress:    Feeling of Stress : Not on file  Social Connections:    Frequency of Communication with Friends and Family: Not on file   Frequency of Social  Gatherings with Friends and Family: Not on file   Attends Religious Services: Not on file   Active Member of Clubs or Organizations: Not on file   Attends Banker Meetings: Not on file   Marital Status: Not on file    Review of Systems  Constitutional: Negative for chills, fatigue and fever.  HENT: Positive for congestion and rhinorrhea. Negative for ear pain and sore throat.  Respiratory: Negative for cough and shortness of breath.   Cardiovascular: Negative for chest pain.  Gastrointestinal: Positive for abdominal pain (followed by GI). Negative for constipation, diarrhea, nausea and vomiting.  Genitourinary: Positive for dysuria. Negative for urgency.  Musculoskeletal: Negative for back pain and myalgias.  Neurological: Negative for dizziness, weakness, light-headedness and headaches.  Psychiatric/Behavioral: Negative for dysphoric mood. The patient is not nervous/anxious.      Objective:  BP 136/80    Pulse 70    Temp (!) 97.3 F (36.3 C)    Ht 5\' 2"  (1.575 m)    Wt 117 lb (53.1 kg)    SpO2 98%    BMI 21.40 kg/m   BP/Weight 12/13/2019 10/11/2019 03/14/2019  Systolic BP 136 128 163  Diastolic BP 80 76 70  Wt. (Lbs) 117 115.4 109  BMI 21.4 21.45 18.71    Physical Exam Vitals reviewed.  Constitutional:      Appearance: Normal appearance. She is normal weight.  Cardiovascular:     Rate and Rhythm: Normal rate and regular rhythm.     Pulses: Normal pulses.     Heart sounds: Normal heart sounds.  Pulmonary:     Effort: Pulmonary effort is normal. No respiratory distress.     Breath sounds: Normal breath sounds.  Abdominal:     General: Abdomen is flat. Bowel sounds are normal.     Palpations: Abdomen is soft.     Tenderness: There is no abdominal tenderness.  Neurological:     Mental Status: She is alert and oriented to person, place, and time.  Psychiatric:        Mood and Affect: Mood normal.        Behavior: Behavior normal.        Lab Results   Component Value Date   WBC 7.0 12/13/2019   HGB 13.2 12/13/2019   HCT 37.5 12/13/2019   PLT 261 12/13/2019   GLUCOSE 82 12/13/2019   CHOL 217 (H) 12/13/2019   TRIG 98 12/13/2019   HDL 51 12/13/2019   LDLCALC 148 (H) 12/13/2019   ALT 10 12/13/2019   AST 19 12/13/2019   NA 140 12/13/2019   K 4.3 12/13/2019   CL 103 12/13/2019   CREATININE 0.92 12/13/2019   BUN 9 12/13/2019   CO2 23 12/13/2019   Urinalysis: normal   Assessment & Plan:   1. Essential hypertension Well controlled.  No changes to medicines.  Continue to work on eating a healthy diet and exercise.  Labs drawn today.  - CBC with Differential/Platelet - Comprehensive metabolic panel  2. Mixed hyperlipidemia Poorly controlled. Recommend statin pending lab results. Continue to work on eating a healthy diet and exercise.  Labs drawn today.  - Lipid panel   3. Dysuria Concerned may be atrophic vaginitis. Premarin cream given. Apply daily x 2 weeks then 1-2 times per week. Call back if would like a rx.  - POCT urinalysis dipstick  4. PVD (peripheral vascular disease) (HCC) The current medical regimen is effective;  continue present plan and medications.   Follow-up: Return in about 3 months (around 03/14/2020).  An After Visit Summary was printed and given to the patient.  03/16/2020 [facility] [phone]

## 2019-12-13 ENCOUNTER — Other Ambulatory Visit: Payer: Self-pay

## 2019-12-13 ENCOUNTER — Encounter: Payer: Self-pay | Admitting: Family Medicine

## 2019-12-13 ENCOUNTER — Ambulatory Visit (INDEPENDENT_AMBULATORY_CARE_PROVIDER_SITE_OTHER): Payer: PPO | Admitting: Family Medicine

## 2019-12-13 VITALS — BP 136/80 | HR 70 | Temp 97.3°F | Ht 62.0 in | Wt 117.0 lb

## 2019-12-13 DIAGNOSIS — I10 Essential (primary) hypertension: Secondary | ICD-10-CM

## 2019-12-13 DIAGNOSIS — I739 Peripheral vascular disease, unspecified: Secondary | ICD-10-CM

## 2019-12-13 DIAGNOSIS — E782 Mixed hyperlipidemia: Secondary | ICD-10-CM | POA: Diagnosis not present

## 2019-12-13 DIAGNOSIS — R300 Dysuria: Secondary | ICD-10-CM

## 2019-12-13 LAB — POCT URINALYSIS DIPSTICK
Bilirubin, UA: NEGATIVE
Blood, UA: NEGATIVE
Glucose, UA: NEGATIVE
Ketones, UA: NEGATIVE
Leukocytes, UA: NEGATIVE
Nitrite, UA: NEGATIVE
Protein, UA: NEGATIVE
Spec Grav, UA: 1.015 (ref 1.010–1.025)
Urobilinogen, UA: NEGATIVE E.U./dL — AB
pH, UA: 6 (ref 5.0–8.0)

## 2019-12-13 MED ORDER — FLUTICASONE PROPIONATE 50 MCG/ACT NA SUSP: 2.0000 | Freq: Every day | NASAL | 6 refills | Status: DC

## 2019-12-13 NOTE — Patient Instructions (Addendum)
Do not take medicine containing Pseudoephedrine, Phenylephrine, or afrin (oxymetazoline.) Sent flonase. Premarin cream given with directions.

## 2019-12-14 LAB — LIPID PANEL
Chol/HDL Ratio: 4.3 ratio (ref 0.0–4.4)
Cholesterol, Total: 217 mg/dL — ABNORMAL HIGH (ref 100–199)
HDL: 51 mg/dL (ref >39)
LDL Chol Calc (NIH): 148 mg/dL — ABNORMAL HIGH (ref 0–99)
Triglycerides: 98 mg/dL (ref 0–149)
VLDL Cholesterol Cal: 18 mg/dL (ref 5–40)

## 2019-12-14 LAB — COMPREHENSIVE METABOLIC PANEL
ALT: 10 IU/L (ref 0–32)
AST: 19 IU/L (ref 0–40)
Albumin/Globulin Ratio: 1.8 (ref 1.2–2.2)
Albumin: 4.3 g/dL (ref 3.7–4.7)
Alkaline Phosphatase: 92 IU/L (ref 48–121)
BUN/Creatinine Ratio: 10 — ABNORMAL LOW (ref 12–28)
BUN: 9 mg/dL (ref 8–27)
Bilirubin Total: 0.2 mg/dL (ref 0.0–1.2)
CO2: 23 mmol/L (ref 20–29)
Calcium: 9.2 mg/dL (ref 8.7–10.3)
Chloride: 103 mmol/L (ref 96–106)
Creatinine, Ser: 0.92 mg/dL (ref 0.57–1.00)
GFR calc Af Amer: 71 mL/min/{1.73_m2} (ref >59)
GFR calc non Af Amer: 62 mL/min/{1.73_m2} (ref >59)
Globulin, Total: 2.4 g/dL (ref 1.5–4.5)
Glucose: 82 mg/dL (ref 65–99)
Potassium: 4.3 mmol/L (ref 3.5–5.2)
Sodium: 140 mmol/L (ref 134–144)
Total Protein: 6.7 g/dL (ref 6.0–8.5)

## 2019-12-14 LAB — CBC WITH DIFFERENTIAL/PLATELET
Basophils Absolute: 0.1 10*3/uL (ref 0.0–0.2)
Basos: 1 %
EOS (ABSOLUTE): 0.1 10*3/uL (ref 0.0–0.4)
Eos: 1 %
Hematocrit: 37.5 % (ref 34.0–46.6)
Hemoglobin: 13.2 g/dL (ref 11.1–15.9)
Immature Grans (Abs): 0 10*3/uL (ref 0.0–0.1)
Immature Granulocytes: 0 %
Lymphocytes Absolute: 2 10*3/uL (ref 0.7–3.1)
Lymphs: 28 %
MCH: 31.3 pg (ref 26.6–33.0)
MCHC: 35.2 g/dL (ref 31.5–35.7)
MCV: 89 fL (ref 79–97)
Monocytes Absolute: 0.4 10*3/uL (ref 0.1–0.9)
Monocytes: 6 %
Neutrophils Absolute: 4.5 10*3/uL (ref 1.4–7.0)
Neutrophils: 64 %
Platelets: 261 10*3/uL (ref 150–450)
RBC: 4.22 x10E6/uL (ref 3.77–5.28)
RDW: 12.9 % (ref 11.7–15.4)
WBC: 7 10*3/uL (ref 3.4–10.8)

## 2019-12-14 LAB — CARDIOVASCULAR RISK ASSESSMENT

## 2019-12-18 DIAGNOSIS — I739 Peripheral vascular disease, unspecified: Secondary | ICD-10-CM | POA: Insufficient documentation

## 2019-12-18 DIAGNOSIS — E782 Mixed hyperlipidemia: Secondary | ICD-10-CM | POA: Insufficient documentation

## 2019-12-18 DIAGNOSIS — I10 Essential (primary) hypertension: Secondary | ICD-10-CM | POA: Insufficient documentation

## 2019-12-22 ENCOUNTER — Other Ambulatory Visit: Payer: Self-pay

## 2019-12-22 MED ORDER — ROSUVASTATIN CALCIUM 5 MG PO TABS: ORAL_TABLET | ORAL | 0 refills | Status: DC

## 2020-01-10 DIAGNOSIS — K5904 Chronic idiopathic constipation: Secondary | ICD-10-CM | POA: Diagnosis not present

## 2020-02-21 ENCOUNTER — Other Ambulatory Visit: Payer: Self-pay | Admitting: Family Medicine

## 2020-02-24 ENCOUNTER — Other Ambulatory Visit: Payer: Self-pay | Admitting: Family Medicine

## 2020-02-29 ENCOUNTER — Telehealth: Payer: Self-pay | Admitting: Family Medicine

## 2020-02-29 NOTE — Chronic Care Management (AMB) (Signed)
  Chronic Care Management   Note  02/29/2020 Name: ADYLYNN HERTENSTEIN MRN: [id number] DOB: 11-Jan-1946  DOROTHA HIRSCHI is a 74 y.o. year old female who is a primary care patient of Cox, Kirsten, MD. I reached out to Rocky Crafts by phone today in response to a referral sent by Ms. Feliciana Rossetti PCP, Blane Ohara, MD.   Ms. Gwinn was given information about Chronic Care Management services today including:  1. CCM service includes personalized support from designated clinical staff supervised by her physician, including individualized plan of care and coordination with other care providers 2. 24/7 contact phone numbers for assistance for urgent and routine care needs. 3. Service will only be billed when office clinical staff spend 20 minutes or more in a month to coordinate care. 4. Only one practitioner may furnish and bill the service in a calendar month. 5. The patient may stop CCM services at any time (effective at the end of the month) by phone call to the office staff.   Patient agreed to services and verbal consent obtained.   Follow up plan:   Aggie Hacker  [employer] Scheduler

## 2020-03-01 ENCOUNTER — Other Ambulatory Visit: Payer: Self-pay

## 2020-03-01 DIAGNOSIS — I776 Arteritis, unspecified: Secondary | ICD-10-CM

## 2020-03-01 DIAGNOSIS — I739 Peripheral vascular disease, unspecified: Secondary | ICD-10-CM

## 2020-03-01 NOTE — Addendum Note (Signed)
Addended byWaynetta Pean on: 03/01/2020 04:28 PM   Modules accepted: Orders

## 2020-03-07 ENCOUNTER — Other Ambulatory Visit: Payer: Self-pay | Admitting: Family Medicine

## 2020-03-14 ENCOUNTER — Ambulatory Visit (INDEPENDENT_AMBULATORY_CARE_PROVIDER_SITE_OTHER): Payer: PPO | Admitting: Physician Assistant

## 2020-03-14 ENCOUNTER — Other Ambulatory Visit: Payer: Self-pay

## 2020-03-14 ENCOUNTER — Ambulatory Visit (INDEPENDENT_AMBULATORY_CARE_PROVIDER_SITE_OTHER)
Admission: RE | Admit: 2020-03-14 | Discharge: 2020-03-14 | Disposition: A | Discharge status: Home / Self Care | Payer: PPO | Source: Ambulatory Visit | Attending: Family | Admitting: Family

## 2020-03-14 ENCOUNTER — Ambulatory Visit (HOSPITAL_COMMUNITY)
Admission: RE | Admit: 2020-03-14 | Discharge: 2020-03-14 | Disposition: A | Discharge status: Home / Self Care | Payer: PPO | Source: Ambulatory Visit | Attending: Vascular Surgery | Admitting: Vascular Surgery

## 2020-03-14 VITALS — BP 170/76 | HR 61 | Temp 98.1°F | Resp 20 | Ht 62.0 in | Wt 114.5 lb

## 2020-03-14 DIAGNOSIS — I779 Disorder of arteries and arterioles, unspecified: Secondary | ICD-10-CM | POA: Insufficient documentation

## 2020-03-14 DIAGNOSIS — I739 Peripheral vascular disease, unspecified: Secondary | ICD-10-CM | POA: Diagnosis not present

## 2020-03-14 NOTE — Progress Notes (Signed)
HISTORY AND PHYSICAL     CC:  follow up. Requesting Provider:  Blane Ohara, MD  HPI: This is a 74 y.o. female who is here today for follow up for PAD.  She is s/p Percutaneous mechanical thrombectomy, left superficial femoral/popliteal artery, Atherectomy with drug coated balloon angioplasty, left superficial femoral/popliteal artery, and Stent, left popliteal artery 06/24/2016 by Dr. Myra Gianotti for acute/subacute occlusion.  Pt was last seen 03/14/2019 and at that time, she was having some pain in her right calf after walking about 30 minutes and her toes at theplantar aspect of right foot feel numb after walking about 1/8 mile; she feels thisis stable,not life limiting for her. she was not having any problems with the left leg.  The pt returns today for follow up.  She states that she has done well since she was here last.  She continues to have an occasional cramp in the right calf with walking but this is not lifestyle limiting.  She does not have any claudication in the left leg.  She does not have any rest pain or non healing ulcers in either leg.   She has had a statin intolerance in the past.  Her PCP recently started her on Crestor and she is taking this 2x/week and she is tolerating this.  She takes aspirin, but does not take this daily.    The pt is on a statin for cholesterol management.    The pt is on an aspirin but does not take regularly.   Other AC:  Plavix The pt is on ARB, CCB for hypertension.  The pt does not have diabetes. Tobacco hx:  current  Pt does not have family hx of AAA.  Past Medical History:  Diagnosis Date  . Acute viral syndrome   . Anemia    Vit D deficiency  . Arteritis, unspecified (HCC) November 01, 2013  . Arthritis    "fingers, knees, upper spine" (06/24/2016)  . B12 deficiency anemia    "had injections in the 1970s"  . CAD (coronary artery disease)   . COPD (chronic obstructive pulmonary disease) (HCC)    "no one has ever told me I have this"  (06/24/2016)  . Diverticulitis   . GERD (gastroesophageal reflux disease)   . Hyperlipidemia   . Hypertension   . Osteoporosis   . Pain October 28, 2013   Left Lower quadrant / back  . Thrombocytopenia (HCC)     Past Surgical History:  Procedure Laterality Date  . ABDOMINAL AORTOGRAM W/LOWER EXTREMITY N/A 06/24/2016   Procedure: Abdominal Aortogram w/Lower Extremity;  Surgeon: Nada Libman, MD;  Location: [facility];  Service: Cardiovascular;  Laterality: N/A;  . ABDOMINAL HYSTERECTOMY    . APPENDECTOMY    . COLONOSCOPY  Aug. 4, 2014  . TUBAL LIGATION      Allergies  Allergen Reactions  . Ace Inhibitors Cough  . Trilipix [Choline Fenofibrate] Other (See Comments)    Muscle cramps, muscle stiffness  . Zocor [Simvastatin] Other (See Comments)    Myalgia    Current Outpatient Medications  Medication Sig Dispense Refill  . alum & mag hydroxide-simeth (MAALOX/MYLANTA) 200-200-20 MG/5ML suspension Take 30 mLs by mouth 2 (two) times daily as needed for indigestion or heartburn. (Patient not taking: Reported on 12/13/2019)    . amLODipine (NORVASC) 5 MG tablet TAKE 1 TABLET BY MOUTH ONCE DAILY 90 tablet 0  . Artificial Tear Solution (SOOTHE XP OP) Apply 1 drop to eye daily as needed (dry eyes).    [name]  aspirin EC 81 MG tablet Take 81 mg by mouth daily.    . Calcium Citrate-Vitamin D (CALCIUM + D PO) Take 1 tablet by mouth 3 (three) times a week.    . clopidogrel (PLAVIX) 75 MG tablet TAKE 1 TABLET BY MOUTH ONCE DAILY. 30 tablet 10  . diphenhydrAMINE (BENADRYL) 25 MG tablet Take 25 mg by mouth at bedtime as needed for allergies.    [name] ezetimibe (ZETIA) 10 MG tablet TAKE 1 TABLET BY MOUTH AT BEDTIME 90 tablet 0  . fluticasone (FLONASE) 50 MCG/ACT nasal spray Place 2 sprays into both nostrils daily. 16 g 6  . ibuprofen (ADVIL,MOTRIN) 200 MG tablet Take 400 mg by mouth 2 (two) times daily as needed for headache or moderate pain.    . Lidocaine (ASPERCREME LIDOCAINE) 4 % PTCH Apply 1  patch topically daily as needed (pain).    . Omega-3 Fatty Acids (FISH OIL) 1200 MG CAPS Take 1,200 mg by mouth daily.    . rosuvastatin (CRESTOR) 5 MG tablet TAKE 1 TABLET BY MOUTH TWO TIMES A WEEK. 30 tablet 0  . trolamine salicylate (ASPERCREME) 10 % cream Apply 1 application topically as needed for muscle pain.    . valsartan (DIOVAN) 320 MG tablet TAKE 1 TABLET BY MOUTH ONCE DAILY 90 tablet 1   No current facility-administered medications for this visit.    Family History  Problem Relation Age of Onset  . Hypertension Mother   . Hyperlipidemia Mother     Social History   Socioeconomic History  . Marital status: Widowed    Spouse name: Not on file  . Number of children: Not on file  . Years of education: Not on file  . Highest education level: Not on file  Occupational History  . Not on file  Tobacco Use  . Smoking status: Current Every Day Smoker    Packs/day: 0.12    Years: 43.00    Pack years: 5.16    Types: Cigarettes  . Smokeless tobacco: Never Used  . Tobacco comment: 2.5-5 cigarettes per day  Vaping Use  . Vaping Use: Never used  Substance and Sexual Activity  . Alcohol use: Yes    Alcohol/week: 2.0 - 3.0 standard drinks    Types: 2 - 3 Standard drinks or equivalent per week    Comment: 06/24/2016 "might have a few drinks/year"  . Drug use: No  . Sexual activity: Never  Other Topics Concern  . Not on file  Social History Narrative  . Not on file   Social Determinants of Health   Financial Resource Strain:   . Difficulty of Paying Living Expenses: Not on file  Food Insecurity:   . Worried About Programme researcher, broadcasting/film/video in the Last Year: Not on file  . Ran Out of Food in the Last Year: Not on file  Transportation Needs:   . Lack of Transportation (Medical): Not on file  . Lack of Transportation (Non-Medical): Not on file  Physical Activity:   . Days of Exercise per Week: Not on file  . Minutes of Exercise per Session: Not on file  Stress:   . Feeling of  Stress : Not on file  Social Connections:   . Frequency of Communication with Friends and Family: Not on file  . Frequency of Social Gatherings with Friends and Family: Not on file  . Attends Religious Services: Not on file  . Active Member of Clubs or Organizations: Not on file  . Attends Banker Meetings:  Not on file  . Marital Status: Not on file  Intimate Partner Violence:   . Fear of Current or Ex-Partner: Not on file  . Emotionally Abused: Not on file  . Physically Abused: Not on file  . Sexually Abused: Not on file     REVIEW OF SYSTEMS:   [X]  denotes positive finding, [ ]  denotes negative finding Cardiac  Comments:  Chest pain or chest pressure:    Shortness of breath upon exertion:    Short of breath when lying flat:    Irregular heart rhythm:        Vascular    Pain in calf, thigh, or hip brought on by ambulation:    Pain in feet at night that wakes you up from your sleep:     Blood clot in your veins:    Leg swelling:         Pulmonary    Oxygen at home:    Productive cough:     Wheezing:         Neurologic    Sudden weakness in arms or legs:     Sudden numbness in arms or legs:     Sudden onset of difficulty speaking or slurred speech:    Temporary loss of vision in one eye:     Problems with dizziness:         Gastrointestinal    Blood in stool:     Vomited blood:         Genitourinary    Burning when urinating:     Blood in urine:        Psychiatric    Major depression:         Hematologic    Bleeding problems:    Problems with blood clotting too easily:        Skin    Rashes or ulcers:        Constitutional    Fever or chills:      PHYSICAL EXAMINATION:  Today's Vitals   03/14/20 1053  BP: (!) 170/76  Pulse: 61  Resp: 20  Temp: 98.1 F (36.7 C)  TempSrc: Temporal  SpO2: 99%  Weight: 114 lb 8 oz (51.9 kg)  Height: 5\' 2"  (1.575 m)   Body mass index is 20.94 kg/m.   General:  WDWN in NAD; vital signs  documented above Gait: Normal HENT: WNL, normocephalic Pulmonary: normal non-labored breathing , without wheezing Cardiac: regular HR, without  Murmur; without carotid bruits Abdomen: soft, NT, no masses; aortic pulse is not palpable Skin: without rashes Vascular Exam/Pulses:  Right Left  Radial 2+ (normal) 2+ (normal)  Femoral 2+ (normal) 2+ (normal)  Popliteal Unable to palpate Unable to palpate  DP Unable to palpate Unable to palpate  PT Unable to palpate Unable to palpate   Extremities: without ischemic changes, without Gangrene , without cellulitis; without open wounds; bilateral feet are warm with sensory and motor in tact.  Musculoskeletal: no muscle wasting or atrophy  Neurologic: A&O X 3;  No focal weakness or paresthesias are detected Psychiatric:  The pt has Normal affect.   Non-Invasive Vascular Imaging:   ABI's/TBI's on 03/14/2020: Right:  0.76/0.49 - Great toe pressure: 82 Left:  1.02/0.66 - Great toe pressure: 110  Arterial duplex on 03/14/2020: +-----------+--------+-----+---------------+--------+---------------+  LEFT    PSV cm/sRatioStenosis    WaveformComments      +-----------+--------+-----+---------------+--------+---------------+  CFA Prox  174              biphasic          +-----------+--------+-----+---------------+--------+---------------+  DFA    97              biphasic          +-----------+--------+-----+---------------+--------+---------------+  SFA Prox  151              biphasic          +-----------+--------+-----+---------------+--------+---------------+  SFA Mid  169      30-49% stenosisbiphasicstent       +-----------+--------+-----+---------------+--------+---------------+  SFA Distal 322      50-74% stenosis    distal to stent  +-----------+--------+-----+---------------+--------+---------------+  POP Mid   99              biphasic          +-----------+--------+-----+---------------+--------+---------------+  POP Distal 95              biphasic          +-----------+--------+-----+---------------+--------+---------------+  ATA Distal 38                            +-----------+--------+-----+---------------+--------+---------------+  PTA Distal 36              biphasic          +-----------+--------+-----+---------------+--------+---------------+  PERO Distal39              biphasic          +-----------+--------+-----+---------------+--------+---------------+     Left Stent(s):  +---------------+---++--------+--------+  Prox to Stent 133biphasic      +---------------+---++--------+--------+  Proximal Stent 160biphasic      +---------------+---++--------+--------+  Mid Stent   87 biphasic      +---------------+---++--------+--------+  Distal Stent  53 biphasic      +---------------+---++--------+--------+  Distal to Stent53 biphasicdampened  +---------------+---++--------+--------+     Summary:  Left: 50-74% stenosis noted in the distal superficial femoral artery /  proximal popliteal (distal to stent) .    Previous ABI's/TBI's on 03/14/2019: Right:  0.69/0.54 - Great toe pressure: 89 Left:  0.99/0.61 - Great toe pressure:  100  Previous arterial duplex on 03/14/2019: +-----------+--------+-----+---------------+----------+---------+  LEFT    PSV cm/sRatioStenosis    Waveform Comments   +-----------+--------+-----+---------------+----------+---------+  CFA Distal 135              triphasic        +-----------+--------+-----+---------------+----------+---------+  DFA    141              biphasic          +-----------+--------+-----+---------------+----------+---------+  SFA Prox  123              triphasic        +-----------+--------+-----+---------------+----------+---------+  SFA Mid  140              triphasic see stent  +-----------+--------+-----+---------------+----------+---------+  SFA Distal 49              biphasic see stent  +-----------+--------+-----+---------------+----------+---------+  POP Prox  167      30-49% stenosistriphasic        +-----------+--------+-----+---------------+----------+---------+  POP Distal 57              biphasic        +-----------+--------+-----+---------------+----------+---------+  ATA Distal 42              triphasic        +-----------+--------+-----+---------------+----------+---------+  PTA Distal 42              biphasic        +-----------+--------+-----+---------------+----------+---------+  PERO  Distal               monophasic       +-----------+--------+-----+---------------+----------+---------+     Left Stent(s):  +-------------------+--------+--------+---------+--------+  mid SFA to prox popPSV cm/sStenosisWaveform Comments  +-------------------+--------+--------+---------+--------+  Prox to Stent   132       triphasic      +-------------------+--------+--------+---------+--------+  Proximal Stent   147       biphasic       +-------------------+--------+--------+---------+--------+  Mid Stent     73       biphasic       +-------------------+--------+--------+---------+--------+  Distal Stent    50       biphasic       +-------------------+--------+--------+---------+--------+  Distal to Stent  49       biphasic        +-------------------+--------+--------+---------+--------+   Summary:  Left: Patent stent with no evidence of stenosis. 30-49% stenosis in the  proximal popliteal artery.   ASSESSMENT/PLAN:: 74 y.o. female here for follow up for PAD and s/p Percutaneous mechanical thrombectomy, left superficial femoral/popliteal artery, Atherectomy with drug coated balloon angioplasty, left superficial femoral/popliteal artery, and Stent, left popliteal artery 06/24/2016 by Dr. Myra Gianotti  PAD -pt doing well without claudication left leg.  She continues to have some occasional cramping in the right leg with walking, but is not lifestyle limiting.  She does not have any rest pain or non healing wounds in either leg.   -her ABI's today were essentially unchanged from previous visit.  On duplex, she did have a narrowing of 50-74% in the distal SFA distal to the stent.  Given she is asymptomatic and her ABI's are unchanged, will have her return in 6 months with repeat studies.   -I discussed with her if she starts having rest pain, non healing ulcers or new cramping in her left leg, she should call us back sooner.  She expressed understanding.   Current smoker: -discussed with pt importance of smoking cessation.  She really does not have an interest in quitting at this time.   -pt will f/u in 6 months with LLE arterial duplex and ABI.Doreatha Massed, [facility] [phone]  Clinic MD:   Edilia Bo

## 2020-03-20 ENCOUNTER — Ambulatory Visit (INDEPENDENT_AMBULATORY_CARE_PROVIDER_SITE_OTHER): Payer: PPO

## 2020-03-20 ENCOUNTER — Other Ambulatory Visit: Payer: Self-pay

## 2020-03-20 DIAGNOSIS — Z23 Encounter for immunization: Secondary | ICD-10-CM | POA: Diagnosis not present

## 2020-03-20 NOTE — Progress Notes (Signed)
   Covid-19 Vaccination Clinic  Name:  Carla Romero    MRN: [id number] DOB: Mar 06, 1946  03/20/2020  Carla Romero was observed post Covid-19 immunization for 15 minutes without incident. She was provided with Vaccine Information Sheet and instruction to access the V-Safe system.   Carla Romero was instructed to call 911 with any severe reactions post vaccine: [name] Difficulty breathing  . Swelling of face and throat  . A fast heartbeat  . A bad rash all over body  . Dizziness and weakness

## 2020-03-21 ENCOUNTER — Ambulatory Visit (INDEPENDENT_AMBULATORY_CARE_PROVIDER_SITE_OTHER): Payer: PPO | Admitting: Family Medicine

## 2020-03-21 ENCOUNTER — Encounter: Payer: Self-pay | Admitting: Family Medicine

## 2020-03-21 VITALS — BP 160/64 | HR 72 | Temp 97.3°F | Resp 18 | Ht 62.0 in | Wt 121.6 lb

## 2020-03-21 DIAGNOSIS — E782 Mixed hyperlipidemia: Secondary | ICD-10-CM

## 2020-03-21 DIAGNOSIS — K5904 Chronic idiopathic constipation: Secondary | ICD-10-CM

## 2020-03-21 DIAGNOSIS — F17219 Nicotine dependence, cigarettes, with unspecified nicotine-induced disorders: Secondary | ICD-10-CM | POA: Diagnosis not present

## 2020-03-21 DIAGNOSIS — I739 Peripheral vascular disease, unspecified: Secondary | ICD-10-CM

## 2020-03-21 DIAGNOSIS — I10 Essential (primary) hypertension: Secondary | ICD-10-CM

## 2020-03-21 MED ORDER — AMLODIPINE BESYLATE 10 MG PO TABS: 10.0000 mg | ORAL_TABLET | Freq: Every day | ORAL | 0 refills | Status: DC

## 2020-03-21 NOTE — Progress Notes (Signed)
Subjective:  Patient ID: Carla Romero, female    DOB: [birth year]  Age: 74 y.o. MRN: [id number]  Chief Complaint  Patient presents with  . Hyperlipidemia  . Hypertension    HPI Hyperlipidemia: On crestor 5 mg one twice a week started 1 month ago. Zetia 10 mg daily PVD: Stented on left leg. Taking plavix and ASA Hypertension: Amlodipine 5 mg once daily and valsartan 320 mg once daily. Intermittently has elevated BP and causes headache. Tobacco disorder: Prefers not to pursue ct scan of 4 lung cancer screening. Prefers not to quit smoking.  Constipation: Dr. Charm Barges tried Linzess (too expensive and was either too effective, then trulance (too expensive and samples caused diarrhea.) Swiss Kriss otc .herbal laxative two to three times a day. Senna laxative.    Current Outpatient Medications on File Prior to Visit  Medication Sig Dispense Refill  . alum & mag hydroxide-simeth (MAALOX/MYLANTA) 200-200-20 MG/5ML suspension Take 30 mLs by mouth 2 (two) times daily as needed for indigestion or heartburn.     . Artificial Tear Solution (SOOTHE XP OP) Apply 1 drop to eye daily as needed (dry eyes).    [name] aspirin EC 81 MG tablet Take 81 mg by mouth daily.    . Calcium Citrate-Vitamin D (CALCIUM + D PO) Take 1 tablet by mouth 3 (three) times a week.    . clopidogrel (PLAVIX) 75 MG tablet TAKE 1 TABLET BY MOUTH ONCE DAILY. 30 tablet 10  . diphenhydrAMINE (BENADRYL) 25 MG tablet Take 25 mg by mouth at bedtime as needed for allergies.    [name] ezetimibe (ZETIA) 10 MG tablet TAKE 1 TABLET BY MOUTH AT BEDTIME 90 tablet 0  . ibuprofen (ADVIL,MOTRIN) 200 MG tablet Take 400 mg by mouth 2 (two) times daily as needed for headache or moderate pain.    . Lidocaine (ASPERCREME LIDOCAINE) 4 % PTCH Apply 1 patch topically daily as needed (pain).    . rosuvastatin (CRESTOR) 5 MG tablet TAKE 1 TABLET BY MOUTH TWO TIMES A WEEK. 30 tablet 0  . trolamine salicylate (ASPERCREME) 10 % cream Apply 1 application topically as  needed for muscle pain.    . valsartan (DIOVAN) 320 MG tablet TAKE 1 TABLET BY MOUTH ONCE DAILY 90 tablet 1   No current facility-administered medications on file prior to visit.   Past Medical History:  Diagnosis Date  . Acute viral syndrome   . Anemia    Vit D deficiency  . Arteritis, unspecified (HCC) November 01, 2013  . Arthritis    "fingers, knees, upper spine" (06/24/2016)  . B12 deficiency anemia    "had injections in the 1970s"  . CAD (coronary artery disease)   . COPD (chronic obstructive pulmonary disease) (HCC)    "no one has ever told me I have this" (06/24/2016)  . Diverticulitis   . GERD (gastroesophageal reflux disease)   . Hyperlipidemia   . Hypertension   . Osteoporosis   . Pain October 28, 2013   Left Lower quadrant / back  . Thrombocytopenia (HCC)    Past Surgical History:  Procedure Laterality Date  . ABDOMINAL AORTOGRAM W/LOWER EXTREMITY N/A 06/24/2016   Procedure: Abdominal Aortogram w/Lower Extremity;  Surgeon: Nada Libman, MD;  Location: [facility];  Service: Cardiovascular;  Laterality: N/A;  . ABDOMINAL HYSTERECTOMY    . APPENDECTOMY    . COLONOSCOPY  Aug. 4, 2014  . FEMORAL ARTERY STENT Left 2018   [employer] 6 mm x 80 mm x 130  cm. Dr Myra Gianotti [phone]  . TUBAL LIGATION      Family History  Problem Relation Age of Onset  . Hypertension Mother   . Hyperlipidemia Mother    Social History   Socioeconomic History  . Marital status: Widowed    Spouse name: Not on file  . Number of children: Not on file  . Years of education: Not on file  . Highest education level: Not on file  Occupational History  . Not on file  Tobacco Use  . Smoking status: Current Every Day Smoker    Packs/day: 0.12    Years: 43.00    Pack years: 5.16    Types: Cigarettes  . Smokeless tobacco: Never Used  . Tobacco comment: 2.5-5 cigarettes per day  Vaping Use  . Vaping Use: Never used  Substance and Sexual Activity  . Alcohol use: Yes     Alcohol/week: 2.0 - 3.0 standard drinks    Types: 2 - 3 Standard drinks or equivalent per week    Comment: 06/24/2016 "might have a few drinks/year"  . Drug use: No  . Sexual activity: Never  Other Topics Concern  . Not on file  Social History Narrative  . Not on file   Social Determinants of Health   Financial Resource Strain:   . Difficulty of Paying Living Expenses: Not on file  Food Insecurity:   . Worried About Programme researcher, broadcasting/film/video in the Last Year: Not on file  . Ran Out of Food in the Last Year: Not on file  Transportation Needs:   . Lack of Transportation (Medical): Not on file  . Lack of Transportation (Non-Medical): Not on file  Physical Activity:   . Days of Exercise per Week: Not on file  . Minutes of Exercise per Session: Not on file  Stress:   . Feeling of Stress : Not on file  Social Connections:   . Frequency of Communication with Friends and Family: Not on file  . Frequency of Social Gatherings with Friends and Family: Not on file  . Attends Religious Services: Not on file  . Active Member of Clubs or Organizations: Not on file  . Attends Banker Meetings: Not on file  . Marital Status: Not on file    Review of Systems  Constitutional: Negative for chills, fatigue and fever.  HENT: Positive for congestion. Negative for rhinorrhea and sore throat.   Respiratory: Negative for cough and shortness of breath.   Cardiovascular: Negative for chest pain.  Gastrointestinal: Positive for constipation. Negative for abdominal pain, diarrhea, nausea and vomiting.  Endocrine: Positive for polydipsia and polyphagia.  Genitourinary: Negative for dysuria and urgency.  Musculoskeletal: Positive for arthralgias (hips and finger pain). Negative for back pain and myalgias.  Neurological: Positive for headaches. Negative for dizziness, weakness and light-headedness.  Psychiatric/Behavioral: Negative for dysphoric mood. The patient is not nervous/anxious.       Objective:  BP (!) 160/64   Pulse 72   Temp (!) 97.3 F (36.3 C)   Resp 18   Ht 5\' 2"  (1.575 m)   Wt 121 lb 9.6 oz (55.2 kg)   BMI 22.24 kg/m   BP/Weight 03/21/2020 03/14/2020 12/13/2019  Systolic BP 160 170 136  Diastolic BP 64 76 80  Wt. (Lbs) 121.6 114.5 117  BMI 22.24 20.94 21.4    Physical Exam Vitals reviewed.  Constitutional:      Appearance: Normal appearance.  Cardiovascular:     Rate and Rhythm: Normal rate and regular  rhythm.  Pulmonary:     Effort: Pulmonary effort is normal.     Breath sounds: Normal breath sounds.  Abdominal:     General: Bowel sounds are normal.  Musculoskeletal:     Cervical back: Normal range of motion.  Neurological:     Mental Status: She is alert.      Lab Results  Component Value Date   WBC 7.0 12/13/2019   HGB 13.2 12/13/2019   HCT 37.5 12/13/2019   PLT 261 12/13/2019   GLUCOSE 82 12/13/2019   CHOL 217 (H) 12/13/2019   TRIG 98 12/13/2019   HDL 51 12/13/2019   LDLCALC 148 (H) 12/13/2019   ALT 10 12/13/2019   AST 19 12/13/2019   NA 140 12/13/2019   K 4.3 12/13/2019   CL 103 12/13/2019   CREATININE 0.92 12/13/2019   BUN 9 12/13/2019   CO2 23 12/13/2019      Assessment & Plan:   1. Essential hypertension Poorly controlled. Increase Amlodipine 10 mg daily.  Continue valsartan 320 mg once daily.  Check bp daily - CBC with Differential/Platelet - Comprehensive metabolic panel - TSH  2. Mixed hyperlipidemia Well controlled.  No changes to medicines.  Continue to work on eating a healthy diet and exercise.  Labs drawn today.  - Lipid panel  3. PVD (peripheral vascular disease) (HCC) Will check carotid US.  Continue aspirin and Crestor.  4. Cigarette nicotine dependence with nicotine-induced disorder Refuses to quit smoking.  Is not interested in discussing.  I did quickly review risks of continuing.  5. Chronic idiopathic constipation Consider Linzess 75 mg daily.  For now per Dr. Silvana Newness  recommendations, may continue OTC medicine.  Meds ordered this encounter  Medications  . amLODipine (NORVASC) 10 MG tablet    Sig: Take 1 tablet (10 mg total) by mouth daily.    Dispense:  90 tablet    Refill:  0    Orders Placed This Encounter  Procedures  . CBC with Differential/Platelet  . Comprehensive metabolic panel  . Lipid panel  . TSH     I spent 30 minutes dedicated to the care of this patient on the date of this encounter to include face-to-face time with the patient, as well as: Reviewing chart, labs, discussing cancer screening..  Follow-up: Return in about 2 weeks (around 04/04/2020).  An After Visit Summary was printed and given to the patient.  Blane Ohara, MD [facility] [phone]

## 2020-03-21 NOTE — Patient Instructions (Addendum)
Increase amlodipine 10 mg once daily.   Hypertension, Adult Hypertension is another name for high blood pressure. High blood pressure forces your heart to work harder to pump blood. This can cause problems over time. There are two numbers in a blood pressure reading. There is a top number (systolic) over a bottom number (diastolic). It is best to have a blood pressure that is below 120/80. Healthy choices can help lower your blood pressure, or you may need medicine to help lower it. What are the causes? The cause of this condition is not known. Some conditions may be related to high blood pressure. What increases the risk?  Smoking.  Having type 2 diabetes mellitus, high cholesterol, or both.  Not getting enough exercise or physical activity.  Being overweight.  Having too much fat, sugar, calories, or salt (sodium) in your diet.  Drinking too much alcohol.  Having long-term (chronic) kidney disease.  Having a family history of high blood pressure.  Age. Risk increases with age.  Race. You may be at higher risk if you are African American.  Gender. Men are at higher risk than women before age 31. After age 83, women are at higher risk than men.  Having obstructive sleep apnea.  Stress. What are the signs or symptoms?  High blood pressure may not cause symptoms. Very high blood pressure (hypertensive crisis) may cause: ? Headache. ? Feelings of worry or nervousness (anxiety). ? Shortness of breath. ? Nosebleed. ? A feeling of being sick to your stomach (nausea). ? Throwing up (vomiting). ? Changes in how you see. ? Very bad chest pain. ? Seizures. How is this treated?  This condition is treated by making healthy lifestyle changes, such as: ? Eating healthy foods. ? Exercising more. ? Drinking less alcohol.  Your health care provider may prescribe medicine if lifestyle changes are not enough to get your blood pressure under control, and if: ? Your top number is  above 130. ? Your bottom number is above 80.  Your personal target blood pressure may vary. Follow these instructions at home: Eating and drinking   If told, follow the DASH eating plan. To follow this plan: ? Fill one half of your plate at each meal with fruits and vegetables. ? Fill one fourth of your plate at each meal with whole grains. Whole grains include whole-wheat pasta, brown rice, and whole-grain bread. ? Eat or drink low-fat dairy products, such as skim milk or low-fat yogurt. ? Fill one fourth of your plate at each meal with low-fat (lean) proteins. Low-fat proteins include fish, chicken without skin, eggs, beans, and tofu. ? Avoid fatty meat, cured and processed meat, or chicken with skin. ? Avoid pre-made or processed food.  Eat less than 1,500 mg of salt each day.  Do not drink alcohol if: ? Your doctor tells you not to drink. ? You are pregnant, may be pregnant, or are planning to become pregnant.  If you drink alcohol: ? Limit how much you use to:  0-1 drink a day for women.  0-2 drinks a day for men. ? Be aware of how much alcohol is in your drink. In the [place] one drink equals one 12 oz bottle of beer (355 mL), one 5 oz glass of wine (148 mL), or one 1 oz glass of hard liquor (44 mL). Lifestyle   Work with your doctor to stay at a healthy weight or to lose weight. Ask your doctor what the best weight is for you.  Get at least 30 minutes of exercise most days of the week. This may include walking, swimming, or biking.  Get at least 30 minutes of exercise that strengthens your muscles (resistance exercise) at least 3 days a week. This may include lifting weights or doing Pilates.  Do not use any products that contain nicotine or tobacco, such as cigarettes, e-cigarettes, and chewing tobacco. If you need help quitting, ask your doctor.  Check your blood pressure at home as told by your doctor.  Keep all follow-up visits as told by your doctor. This is  important. Medicines  Take over-the-counter and prescription medicines only as told by your doctor. Follow directions carefully.  Do not skip doses of blood pressure medicine. The medicine does not work as well if you skip doses. Skipping doses also puts you at risk for problems.  Ask your doctor about side effects or reactions to medicines that you should watch for. Contact a doctor if you:  Think you are having a reaction to the medicine you are taking.  Have headaches that keep coming back (recurring).  Feel dizzy.  Have swelling in your ankles.  Have trouble with your vision. Get help right away if you:  Get a very bad headache.  Start to feel mixed up (confused).  Feel weak or numb.  Feel faint.  Have very bad pain in your: ? Chest. ? Belly (abdomen).  Throw up more than once.  Have trouble breathing. Summary  Hypertension is another name for high blood pressure.  High blood pressure forces your heart to work harder to pump blood.  For most people, a normal blood pressure is less than 120/80.  Making healthy choices can help lower blood pressure. If your blood pressure does not get lower with healthy choices, you may need to take medicine. This information is not intended to replace advice given to you by your health care provider. Make sure you discuss any questions you have with your health care provider. Document Revised: 12/16/2017 Document Reviewed: 12/16/2017 Elsevier Patient Education  2020 [employer].

## 2020-03-22 LAB — CBC WITH DIFFERENTIAL/PLATELET
Basophils Absolute: 0.1 10*3/uL (ref 0.0–0.2)
Basos: 1 %
EOS (ABSOLUTE): 0.1 10*3/uL (ref 0.0–0.4)
Eos: 1 %
Hematocrit: 38.7 % (ref 34.0–46.6)
Hemoglobin: 13.5 g/dL (ref 11.1–15.9)
Immature Grans (Abs): 0 10*3/uL (ref 0.0–0.1)
Immature Granulocytes: 0 %
Lymphocytes Absolute: 1.5 10*3/uL (ref 0.7–3.1)
Lymphs: 16 %
MCH: 30.9 pg (ref 26.6–33.0)
MCHC: 34.9 g/dL (ref 31.5–35.7)
MCV: 89 fL (ref 79–97)
Monocytes Absolute: 0.5 10*3/uL (ref 0.1–0.9)
Monocytes: 6 %
Neutrophils Absolute: 6.8 10*3/uL (ref 1.4–7.0)
Neutrophils: 76 %
Platelets: 253 10*3/uL (ref 150–450)
RBC: 4.37 x10E6/uL (ref 3.77–5.28)
RDW: 12.3 % (ref 11.7–15.4)
WBC: 9 10*3/uL (ref 3.4–10.8)

## 2020-03-22 LAB — COMPREHENSIVE METABOLIC PANEL
ALT: 13 IU/L (ref 0–32)
AST: 21 IU/L (ref 0–40)
Albumin/Globulin Ratio: 1.9 (ref 1.2–2.2)
Albumin: 4.6 g/dL (ref 3.7–4.7)
Alkaline Phosphatase: 113 IU/L (ref 44–121)
BUN/Creatinine Ratio: 8 — ABNORMAL LOW (ref 12–28)
BUN: 9 mg/dL (ref 8–27)
Bilirubin Total: 0.3 mg/dL (ref 0.0–1.2)
CO2: 20 mmol/L (ref 20–29)
Calcium: 9.5 mg/dL (ref 8.7–10.3)
Chloride: 106 mmol/L (ref 96–106)
Creatinine, Ser: 1.08 mg/dL — ABNORMAL HIGH (ref 0.57–1.00)
GFR calc Af Amer: 59 mL/min/{1.73_m2} — ABNORMAL LOW (ref >59)
GFR calc non Af Amer: 51 mL/min/{1.73_m2} — ABNORMAL LOW (ref >59)
Globulin, Total: 2.4 g/dL (ref 1.5–4.5)
Glucose: 82 mg/dL (ref 65–99)
Potassium: 4.5 mmol/L (ref 3.5–5.2)
Sodium: 142 mmol/L (ref 134–144)
Total Protein: 7 g/dL (ref 6.0–8.5)

## 2020-03-22 LAB — LIPID PANEL
Chol/HDL Ratio: 3.8 ratio (ref 0.0–4.4)
Cholesterol, Total: 207 mg/dL — ABNORMAL HIGH (ref 100–199)
HDL: 54 mg/dL (ref >39)
LDL Chol Calc (NIH): 132 mg/dL — ABNORMAL HIGH (ref 0–99)
Triglycerides: 117 mg/dL (ref 0–149)
VLDL Cholesterol Cal: 21 mg/dL (ref 5–40)

## 2020-03-22 LAB — CARDIOVASCULAR RISK ASSESSMENT

## 2020-03-22 LAB — TSH: TSH: 2.11 u[IU]/mL (ref 0.450–4.500)

## 2020-03-26 ENCOUNTER — Other Ambulatory Visit: Payer: Self-pay

## 2020-03-26 MED ORDER — ROSUVASTATIN CALCIUM 5 MG PO TABS
ORAL_TABLET | ORAL | 0 refills | Status: DC
Start: 2020-03-26 — End: 2020-04-25

## 2020-04-05 ENCOUNTER — Other Ambulatory Visit: Payer: Self-pay | Admitting: Family Medicine

## 2020-04-19 ENCOUNTER — Telehealth: Payer: Self-pay

## 2020-04-19 NOTE — Chronic Care Management (AMB) (Signed)
Chronic Care Management Pharmacy Assistant   Name: Carla Romero  MRN: [id number] DOB: 1945-10-01  Reason for Encounter: Disease State Initial questions  Patient Questions:  1.  Have you seen any other providers since your last visit? No  2.  Any changes in your medicines or health? No     PCP : Blane Ohara, MD  Allergies:   Allergies  Allergen Reactions   Ace Inhibitors Cough   Trilipix [Choline Fenofibrate] Other (See Comments)    Muscle cramps, muscle stiffness   Zocor [Simvastatin] Other (See Comments)    Myalgia    Medications: Outpatient Encounter Medications as of 04/19/2020  Medication Sig Note   alum & mag hydroxide-simeth (MAALOX/MYLANTA) 200-200-20 MG/5ML suspension Take 30 mLs by mouth 2 (two) times daily as needed for indigestion or heartburn.     amLODipine (NORVASC) 10 MG tablet Take 1 tablet (10 mg total) by mouth daily.    Artificial Tear Solution (SOOTHE XP OP) Apply 1 drop to eye daily as needed (dry eyes).    aspirin EC 81 MG tablet Take 81 mg by mouth daily.    Calcium Citrate-Vitamin D (CALCIUM + D PO) Take 1 tablet by mouth 3 (three) times a week. 06/19/2016: Pt puts this medication on hold if she has any stomach problems   clopidogrel (PLAVIX) 75 MG tablet TAKE 1 TABLET BY MOUTH ONCE DAILY.    diphenhydrAMINE (BENADRYL) 25 MG tablet Take 25 mg by mouth at bedtime as needed for allergies.    ezetimibe (ZETIA) 10 MG tablet TAKE 1 TABLET BY MOUTH AT BEDTIME    ibuprofen (ADVIL,MOTRIN) 200 MG tablet Take 400 mg by mouth 2 (two) times daily as needed for headache or moderate pain.    Lidocaine (ASPERCREME LIDOCAINE) 4 % PTCH Apply 1 patch topically daily as needed (pain).    rosuvastatin (CRESTOR) 5 MG tablet Take 5mg  three times weekly    trolamine salicylate (ASPERCREME) 10 % cream Apply 1 application topically as needed for muscle pain.    valsartan (DIOVAN) 320 MG tablet TAKE 1 TABLET BY MOUTH ONCE DAILY    No  facility-administered encounter medications on file as of 04/19/2020.    Current Diagnosis: Patient Active Problem List   Diagnosis Date Noted   Essential hypertension 12/18/2019   Mixed hyperlipidemia 12/18/2019   PVD (peripheral vascular disease) (HCC) 12/18/2019   Annual physical exam 10/12/2019   Cigarette nicotine dependence with nicotine-induced disorder 10/12/2019   Bleeding at insertion site 06/24/2016   Aortitis (HCC) 11/28/2013   Have you seen any other providers since your last visit? No  Any changes in your medications or health? No  Any side effects from any medications? Not that she is aware of.  Do you have an symptoms or problems not managed by your medications? Not at this time.  Any concerns about your health right now? Denies any concerns  Has your provider asked that you check blood pressure, blood sugar, or follow special diet at home? States that Dr. 01/28/2014 asked her to keep a log of her blood pressure so she has been doing so.  Do you get any type of exercise on a regular basis? States that she does "warm-up" type exercises every morning and does try to walk daily.  Can you think of a goal you would like to reach for your health? States she would like to maintain good health.  Do you have any problems getting your medications? Not at this time.  Is there anything  that you would like to discuss during the appointment? Can not think of anything in particular.   Patient reminded to have all medications and supplements available for review with Katy Apo, Ilda Basset. D, at their telephone visit on 04/24/2020.  Follow-Up:  Pharmacist Review   Levada Dy. Manson Passey, CPP notified  Jomarie Longs, [facility] Clinical Pharmacy Assistant [phone]

## 2020-04-23 NOTE — Chronic Care Management (AMB) (Signed)
Chronic Care Management Pharmacy  Name: Carla Romero  MRN: [id number] DOB: Dec 29, 1945  Chief Complaint/ HPI  Carla Romero,  75 y.o. , female presents for their Initial CCM visit with the clinical pharmacist via telephone due to COVID-19 Pandemic.  PCP : [name], MD  Their chronic conditions include: aortitis, hypertension, PVD, hyperlipidemia.   Plan Recommendations:   Patient had not increased amlodipine dose as prescribed 03/21/2020. Patient will begin new dose. Pharmacy assistant to call in 1 week for bp check.   Office Visits: 03/21/2020 - increase amlodipine 10 mg daily. Consider Linzess 75 mg daily. Increase Crestor three times weekly.  03/20/2020 - Moderna COVID.  12/13/2019 - premarin cream given for atrophic vaginitis. Flonase given. Crestor 5 mg twice weekly.  Consult Visit: 03/14/2020 - vascular - encouraged smoking cessation.  01/10/2020 - GI - notes not available.  11/02/2019 - optometry.   Medications: Outpatient Encounter Medications as of 04/24/2020  Medication Sig Note   Artificial Tear Solution (SOOTHE XP OP) Apply 1 drop to eye daily as needed (dry eyes).    aspirin EC 81 MG tablet Take 81 mg by mouth daily.    B COMPLEX VITAMINS SL Place 1 tablet under the tongue daily.    clopidogrel (PLAVIX) 75 MG tablet TAKE 1 TABLET BY MOUTH ONCE DAILY.    ezetimibe (ZETIA) 10 MG tablet TAKE 1 TABLET BY MOUTH AT BEDTIME    magnesium hydroxide (MILK OF MAGNESIA) 400 MG/5ML suspension Take by mouth daily as needed for mild constipation.    valsartan (DIOVAN) 320 MG tablet TAKE 1 TABLET BY MOUTH ONCE DAILY    [DISCONTINUED] rosuvastatin (CRESTOR) 5 MG tablet Take 12m three times weekly    alum & mag hydroxide-simeth (MAALOX/MYLANTA) 200-200-20 MG/5ML suspension Take 30 mLs by mouth 2 (two) times daily as needed for indigestion or heartburn.  (Patient not taking: Reported on 04/24/2020)    amLODipine (NORVASC) 10 MG tablet Take 1 tablet (10 mg total) by  mouth daily. (Patient not taking: Reported on 04/24/2020)    Calcium Citrate-Vitamin D (CALCIUM + D PO) Take 1 tablet by mouth 3 (three) times a week. (Patient not taking: Reported on 04/24/2020) 06/19/2016: Pt puts this medication on hold if she has any stomach problems   diphenhydrAMINE (BENADRYL) 25 MG tablet Take 25 mg by mouth at bedtime as needed for allergies. (Patient not taking: Reported on 04/24/2020)    ibuprofen (ADVIL,MOTRIN) 200 MG tablet Take 400 mg by mouth 2 (two) times daily as needed for headache or moderate pain. (Patient not taking: Reported on 04/24/2020)    Lidocaine 4 % PTCH Apply 1 patch topically daily as needed (pain). (Patient not taking: Reported on 04/24/2020)    trolamine salicylate (ASPERCREME) 10 % cream Apply 1 application topically as needed for muscle pain. (Patient not taking: Reported on 04/24/2020)    No facility-administered encounter medications on file as of 04/24/2020.   Allergies  Allergen Reactions   Ace Inhibitors Cough   Trilipix [Choline Fenofibrate] Other (See Comments)    Muscle cramps, muscle stiffness   Zocor [Simvastatin] Other (See Comments)    Myalgia   SDOH Screenings   Alcohol Screen: Not on file  Depression (PHQ2-9): Low Risk    PHQ-2 Score: 2  Financial Resource Strain: Not on file  Food Insecurity: Not on file  Housing: Not on file  Physical Activity: Not on file  Social Connections: Not on file  Stress: Not on file  Tobacco Use: High Risk   Smoking  Tobacco Use: Current Every Day Smoker   Smokeless Tobacco Use: Never Used  Transportation Needs: Not on file   Current Diagnosis/Assessment:  Goals Addressed            This [place] (see longitudinal plan of care for additional care plan information)  Current Barriers:   Chronic Disease Management support, education, and care coordination needs related to PVD, Hypertension and Hyperlipidemia   Hypertension BP  Readings from Last 3 Encounters:  03/21/20 (!) 160/64  03/14/20 (!) 170/76  12/13/19 136/80    Pharmacist Clinical Goal(s): o Over the next 90 days, patient will work with PharmD and providers to achieve BP goal <140/90  Current regimen:  o Amlodipine 10 mg daily  o Valsartan 320 mg daily   Interventions: o Encouraged patient to increase amlodipine to 10 mg daily as ordered by Dr. Tobie Poet 03/21/2020.  o Discussed patient's home blood pressure readings.  o Encouraged adequate hydration and low sodium diet.   Patient self care activities - Over the next 90 days, patient will: o Check BP daily, document, and provide at future appointments o Ensure daily salt intake < 2300 mg/day  Hyperlipidemia Lab Results  Component Value Date/Time   LDLCALC 132 (H) 03/21/2020 08:16 AM    Pharmacist Clinical Goal(s): o Over the next 90 days, patient will work with PharmD and providers to achieve LDL goal < 100  Current regimen:  o Crestor 5 mg three times weekly   Interventions: o Discussed Crestor increased dose to three times weekly.  o Pharmacist coordinated updated prescription sent to pharmacy.  o Discussed benefit of low fat diet and exercise.   Patient self care activities - Over the next 90 days, patient will: o Continue current medications.  PVD  Pharmacist Clinical Goal(s) o Over the next 90 days, patient will work with PharmD and providers to minimize risks associated with PVD.   Current regimen:  o Aspirin 81 mg daily  o Plavix 75 mg daily  o Crestor 5 mg three times weekly  o Valsartan 320 mg daily   Interventions: o Reviewed how patient is taking aspirin at home (3-4 times weekly). Reviewed that aspirin is prescribed daily by vascular surgery due to PVD.   Patient self care activities - Over the next 90 days, patient will: o Begin taking aspirin daily.   Medication management  Pharmacist Clinical Goal(s): o Over the next 90 days, patient will work with PharmD and  providers to maintain optimal medication adherence  Current pharmacy: [facility]  Interventions o Comprehensive medication review performed. o Continue current medication management strategy  Patient self care activities - Over the next 90 days, patient will: o Focus on medication adherence by using pill box o Take medications as prescribed o Report any questions or concerns to PharmD and/or provider(s)  Initial goal documentation        Hypertension   BP today is:  Above goal   Office blood pressures are  BP Readings from Last 3 Encounters:  03/21/20 (!) 160/64  03/14/20 (!) 170/76  12/13/19 136/80   Lab Results  Component Value Date   CREATININE 1.08 (H) 03/21/2020   BUN 9 03/21/2020   GFRNONAA 51 (L) 03/21/2020   GFRAA 59 (L) 03/21/2020   NA 142 03/21/2020   K 4.5 03/21/2020   CALCIUM 9.5 03/21/2020   CO2 20 03/21/2020    Patient has failed these meds in  the past: none reported  Patient is currently uncontrolled on the following medications:   Amlodipine 10 mg daily - increased by Dr. Tobie Poet 12/01 but not increased by patient at this time.   Valsartan 320 mg daily   Patient checks BP at home daily  Patient home BP readings are ranging: 146/65, 138/66, 154/69, 177/74, 174/63  We discussed diet and exercise extensively  Diet: doesn't think about what she eats. Hard to cook for 1 person. Eats quick and easy things. Feels that her blood pressure isn't as well controlled due to sodium in diet. Limits pork in her diet. Drinking Boost for Women to help supplement diet each morning.   Exercise: Patient exercises in the morning and walks some during the day.   Has headaches when bp >366 systolic. Patient reports not increasing Amlodipine 10 mg yet. Patient has been checking blood pressure daily. Pharmacist encouraged patient to begin taking 2 of her current amlodipine 5 mg daily (10 mg) and continue checking blood pressure daily. Pharmacy team will call and check home  blood pressure readings next week. Pharmacist will review and coordinate next steps.   Plan  Increase amlodipine 10 mg daily and continue checking blood pressure daily.     Hyperlipidemia   Last lipids Lab Results  Component Value Date   CHOL 207 (H) 03/21/2020   HDL 54 03/21/2020   LDLCALC 132 (H) 03/21/2020   TRIG 117 03/21/2020   CHOLHDL 3.8 03/21/2020   Hepatic Function Latest Ref Rng & Units 03/21/2020 12/13/2019  Total Protein 6.0 - 8.5 g/dL 7.0 6.7  Albumin 3.7 - 4.7 g/dL 4.6 4.3  AST 0 - 40 IU/L 21 19  ALT 0 - 32 IU/L 13 10  Alk Phosphatase 44 - 121 IU/L 113 92  Total Bilirubin 0.0 - 1.2 mg/dL 0.3 0.2     The 10-year ASCVD risk score Mikey Bussing DC Jr., et al., 2013) is: 36.8%   Values used to calculate the score:     Age: 61 years     Sex: Female     Is Non-Hispanic African American: No     Diabetic: No     Tobacco smoker: Yes     Systolic Blood Pressure: 440 mmHg     Is BP treated: Yes     HDL Cholesterol: 54 mg/dL     Total Cholesterol: 207 mg/dL   Patient has failed these meds in past: none reported Patient is currently uncontrolled on the following medications:   zetia 10 mg daily   Crestor 5 mg three times weekly   We discussed:  diet and exercise extensively. Patient reports that she has increased Crestor to three times weekly as recommended after last blood work. Pharmacist requested updated prescription sent to [facility]. Will review updated labs in March for improved cholesterol.   Plan  Continue current medications   PVD   Patient has failed these meds in past: none reported Patient is currently controlled on the following medications:   aspirin 81 mg daily  - not taking daily. Using 3-4 times weekly.   Plavix 75 mg daily   We discussed:  Patient had been taking aspirin 3-4 times weekly. Pharmacist encouraged resuming daily as prescribed. Patient denies problems with bleeding or bruising.    Plan  Resume aspirin daily .   Constipation    Patient has failed these meds in past: Trulance, Linzess, miralax, benefiber Patient is currently controlled on the following medications:   Swiss Kriss OTC 2-3 times daily prn constipation  Milk of Magnesia daily prn constipation  We discussed:  Patient reports drinking lots of water each day. Drinking 2 pitchers of filtered water daily. Reports not eating as many fruits and vegetables as she should for fiber. Dr. Melina Copa advised the patient to stop taking Swiss Kriss and Senna.   Too much fiber causes the patient's stomach/stool to be like concrete. Patient reports using medications sparingly. Patient reports symptoms well managed at this time.    Plan  Continue current medications   Osteopenia / Osteoporosis   Last DEXA Scan: is not interested in updated scan. Reports osteopenia.   No results found for: VD25OH   Patient is not a candidate for pharmacologic treatment  Patient has failed these meds in past: none reported Patient is currently controlled on the following medications:   Calcium citrate with D three times a week   We discussed:  Recommend [id number] units of vitamin D daily. Recommend 1200 mg of calcium daily from dietary and supplemental sources. Recommend weight-bearing and muscle strengthening exercises for building and maintaining bone density.   Drinks milk and eats yogurt. Exercises daily. Does not want to update Dexa scan. States the last one showed bone loss and doesn't feel the need to update. Patient cautious with calcium intake due to constipation.   Plan  Continue current medications   Health Maintenance   Patient is currently controlled on the following medications:   Artificial tears daily prn   We discussed:  Reviewed OTC and prescription medications during visit.   Plan  Continue current medications   Vaccines   Reviewed and discussed patient's vaccination history.    Immunization History  Administered Date(s) Administered    Moderna SARS-COV2 Booster Vaccination 03/20/2020   Moderna Sars-Covid-2 Vaccination 06/03/2019, 07/01/2019   Pneumococcal Conjugate-13 03/28/2015   Pneumococcal Polysaccharide-23 03/09/2011    Plan  Recommended patient receive annual flu vaccine in office.   Medication Management   Patient's preferred pharmacy is:  [facility], [place] [address] Phone: [phone] Fax: [phone]  [facility], [place] [address] Phone: [phone] Fax: [phone]  Uses pill box? Yes Pt endorses good compliance  We discussed: Current pharmacy is preferred with insurance plan and patient is satisfied with pharmacy services  Plan  Continue current medication management strategy    Follow up: 3 month phone visit

## 2020-04-24 ENCOUNTER — Ambulatory Visit: Payer: PPO

## 2020-04-24 ENCOUNTER — Other Ambulatory Visit: Payer: Self-pay

## 2020-04-24 DIAGNOSIS — E782 Mixed hyperlipidemia: Secondary | ICD-10-CM

## 2020-04-24 DIAGNOSIS — I10 Essential (primary) hypertension: Secondary | ICD-10-CM

## 2020-04-25 ENCOUNTER — Other Ambulatory Visit: Payer: Self-pay

## 2020-04-25 MED ORDER — ROSUVASTATIN CALCIUM 5 MG PO TABS
ORAL_TABLET | ORAL | 0 refills | Status: DC
Start: 2020-04-25 — End: 2020-07-16

## 2020-04-26 NOTE — Patient Instructions (Addendum)
Visit Information  Thank you for your time discussing your medications. I look forward to working with you to achieve your health care goals. Below is a summary of what we talked about during our visit.   Goals Addressed            This Visit's Progress   . Pharmacy Care Plan       CARE PLAN ENTRY (see longitudinal plan of care for additional care plan information)  Current Barriers:  . Chronic Disease Management support, education, and care coordination needs related to PVD, Hypertension and Hyperlipidemia   Hypertension BP Readings from Last 3 Encounters:  03/21/20 (!) 160/64  03/14/20 (!) 170/76  12/13/19 136/80   . Pharmacist Clinical Goal(s): o Over the next 90 days, patient will work with PharmD and providers to achieve BP goal <140/90 . Current regimen:  o Amlodipine 10 mg daily  o Valsartan 320 mg daily  . Interventions: o Encouraged patient to increase amlodipine to 10 mg daily as ordered by Dr. Sedalia Muta 03/21/2020.  o Discussed patient's home blood pressure readings.  o Encouraged adequate hydration and low sodium diet.  . Patient self care activities - Over the next 90 days, patient will: o Check BP daily, document, and provide at future appointments o Ensure daily salt intake < 2300 mg/day  Hyperlipidemia Lab Results  Component Value Date/Time   LDLCALC 132 (H) 03/21/2020 08:16 AM   . Pharmacist Clinical Goal(s): o Over the next 90 days, patient will work with PharmD and providers to achieve LDL goal < 100 . Current regimen:  o Crestor 5 mg three times weekly  . Interventions: o Discussed Crestor increased dose to three times weekly.  o Pharmacist coordinated updated prescription sent to pharmacy.  o Discussed benefit of low fat diet and exercise.  . Patient self care activities - Over the next 90 days, patient will: o Continue current medications.  PVD . Pharmacist Clinical Goal(s) o Over the next 90 days, patient will work with PharmD and providers to  minimize risks associated with PVD.  [name] Current regimen:  o Aspirin 81 mg daily  o Plavix 75 mg daily  o Crestor 5 mg three times weekly  o Valsartan 320 mg daily  . Interventions: o Reviewed how patient is taking aspirin at home (3-4 times weekly). Reviewed that aspirin is prescribed daily by vascular surgery due to PVD.  [name] Patient self care activities - Over the next 90 days, patient will: o Begin taking aspirin daily.   Medication management . Pharmacist Clinical Goal(s): o Over the next 90 days, patient will work with PharmD and providers to maintain optimal medication adherence . Current pharmacy: [facility] . Interventions o Comprehensive medication review performed. o Continue current medication management strategy . Patient self care activities - Over the next 90 days, patient will: o Focus on medication adherence by using pill box o Take medications as prescribed o Report any questions or concerns to PharmD and/or provider(s)  Initial goal documentation        Ms. Sharpless was given information about Chronic Care Management services today including:  1. CCM service includes personalized support from designated clinical staff supervised by her physician, including individualized plan of care and coordination with other care providers 2. 24/7 contact phone numbers for assistance for urgent and routine care needs. 3. Standard insurance, coinsurance, copays and deductibles apply for chronic care management only during months in which we provide at least 20 minutes of these services. Most insurances cover  these services at 100%, however patients may be responsible for any copay, coinsurance and/or deductible if applicable. This service may help you avoid the need for more expensive face-to-face services. 4. Only one practitioner may furnish and bill the service in a calendar month. 5. The patient may stop CCM services at any time (effective at the end of the month) by phone call to  the office staff.  Patient agreed to services and verbal consent obtained.   The patient verbalized understanding of instructions, educational materials, and care plan provided today and agreed to receive a mailed copy of patient instructions, educational materials, and care plan.  Telephone follow up appointment with pharmacy team member scheduled for: 07/2020  Juliane Lack, PharmD Clinical Pharmacist [facility] [phone] (office) [phone] (mobile)  DASH Eating Plan DASH stands for "Dietary Approaches to Stop Hypertension." The DASH eating plan is a healthy eating plan that has been shown to reduce high blood pressure (hypertension). It may also reduce your risk for type 2 diabetes, heart disease, and stroke. The DASH eating plan may also help with weight loss. What are tips for following this plan?  General guidelines  Avoid eating more than 2,300 mg (milligrams) of salt (sodium) a day. If you have hypertension, you may need to reduce your sodium intake to 1,500 mg a day.  Limit alcohol intake to no more than 1 drink a day for nonpregnant women and 2 drinks a day for men. One drink equals 12 oz of beer, 5 oz of wine, or 1 oz of hard liquor.  Work with your health care provider to maintain a healthy body weight or to lose weight. Ask what an ideal weight is for you.  Get at least 30 minutes of exercise that causes your heart to beat faster (aerobic exercise) most days of the week. Activities may include walking, swimming, or biking.  Work with your health care provider or diet and nutrition specialist (dietitian) to adjust your eating plan to your individual calorie needs. Reading food labels   Check food labels for the amount of sodium per serving. Choose foods with less than 5 percent of the Daily Value of sodium. Generally, foods with less than 300 mg of sodium per serving fit into this eating plan.  To find whole grains, look for the word "whole" as the first  word in the ingredient list. Shopping  Buy products labeled as "low-sodium" or "no salt added."  Buy fresh foods. Avoid canned foods and premade or frozen meals. Cooking  Avoid adding salt when cooking. Use salt-free seasonings or herbs instead of table salt or sea salt. Check with your health care provider or pharmacist before using salt substitutes.  Do not fry foods. Cook foods using healthy methods such as baking, boiling, grilling, and broiling instead.  Cook with heart-healthy oils, such as olive, canola, soybean, or sunflower oil. Meal planning  Eat a balanced diet that includes: ? 5 or more servings of fruits and vegetables each day. At each meal, try to fill half of your plate with fruits and vegetables. ? Up to 6-8 servings of whole grains each day. ? Less than 6 oz of lean meat, poultry, or fish each day. A 3-oz serving of meat is about the same size as a deck of cards. One egg equals 1 oz. ? 2 servings of low-fat dairy each day. ? A serving of nuts, seeds, or beans 5 times each week. ? Heart-healthy fats. Healthy fats called Omega-3 fatty acids are  found in foods such as flaxseeds and coldwater fish, like sardines, salmon, and mackerel.  Limit how much you eat of the following: ? Canned or prepackaged foods. ? Food that is high in trans fat, such as fried foods. ? Food that is high in saturated fat, such as fatty meat. ? Sweets, desserts, sugary drinks, and other foods with added sugar. ? Full-fat dairy products.  Do not salt foods before eating.  Try to eat at least 2 vegetarian meals each week.  Eat more home-cooked food and less restaurant, buffet, and fast food.  When eating at a restaurant, ask that your food be prepared with less salt or no salt, if possible. What foods are recommended? The items listed may not be a complete list. Talk with your dietitian about what dietary choices are best for you. Grains Whole-grain or whole-wheat bread. Whole-grain or  whole-wheat pasta. Lonza Shimabukuro rice. Modena Morrow. Bulgur. Whole-grain and low-sodium cereals. Pita bread. Low-fat, low-sodium crackers. Whole-wheat flour tortillas. Vegetables Fresh or frozen vegetables (raw, steamed, roasted, or grilled). Low-sodium or reduced-sodium tomato and vegetable juice. Low-sodium or reduced-sodium tomato sauce and tomato paste. Low-sodium or reduced-sodium canned vegetables. Fruits All fresh, dried, or frozen fruit. Canned fruit in natural juice (without added sugar). Meat and other protein foods Skinless chicken or [place]. Ground chicken or [place]. Pork with fat trimmed off. Fish and seafood. Egg whites. Dried beans, peas, or lentils. Unsalted nuts, nut butters, and seeds. Unsalted canned beans. Lean cuts of beef with fat trimmed off. Low-sodium, lean deli meat. Dairy Low-fat (1%) or fat-free (skim) milk. Fat-free, low-fat, or reduced-fat cheeses. Nonfat, low-sodium ricotta or cottage cheese. Low-fat or nonfat yogurt. Low-fat, low-sodium cheese. Fats and oils Soft margarine without trans fats. Vegetable oil. Low-fat, reduced-fat, or light mayonnaise and salad dressings (reduced-sodium). Canola, safflower, olive, soybean, and sunflower oils. Avocado. Seasoning and other foods Herbs. Spices. Seasoning mixes without salt. Unsalted popcorn and pretzels. Fat-free sweets. What foods are not recommended? The items listed may not be a complete list. Talk with your dietitian about what dietary choices are best for you. Grains Baked goods made with fat, such as croissants, muffins, or some breads. Dry pasta or rice meal packs. Vegetables Creamed or fried vegetables. Vegetables in a cheese sauce. Regular canned vegetables (not low-sodium or reduced-sodium). Regular canned tomato sauce and paste (not low-sodium or reduced-sodium). Regular tomato and vegetable juice (not low-sodium or reduced-sodium). Angie Fava. Olives. Fruits Canned fruit in a light or heavy syrup. Fried fruit. Fruit  in cream or butter sauce. Meat and other protein foods Fatty cuts of meat. Ribs. Fried meat. Berniece Salines. Sausage. Bologna and other processed lunch meats. Salami. Fatback. Hotdogs. Bratwurst. Salted nuts and seeds. Canned beans with added salt. Canned or smoked fish. Whole eggs or egg yolks. Chicken or [place] with skin. Dairy Whole or 2% milk, cream, and half-and-half. Whole or full-fat cream cheese. Whole-fat or sweetened yogurt. Full-fat cheese. Nondairy creamers. Whipped toppings. Processed cheese and cheese spreads. Fats and oils Butter. Stick margarine. Lard. Shortening. Ghee. Bacon fat. Tropical oils, such as coconut, palm kernel, or palm oil. Seasoning and other foods Salted popcorn and pretzels. Onion salt, garlic salt, seasoned salt, table salt, and sea salt. Worcestershire sauce. Tartar sauce. Barbecue sauce. Teriyaki sauce. Soy sauce, including reduced-sodium. Steak sauce. Canned and packaged gravies. Fish sauce. Oyster sauce. Cocktail sauce. Horseradish that you find on the shelf. Ketchup. Mustard. Meat flavorings and tenderizers. Bouillon cubes. Hot sauce and Tabasco sauce. Premade or packaged marinades. Premade or packaged taco seasonings.  Relishes. Regular salad dressings. Where to find more information:  National Heart, Lung, and Blood Institute: PopSteam.is  American Heart Association: www.heart.org Summary  The DASH eating plan is a healthy eating plan that has been shown to reduce high blood pressure (hypertension). It may also reduce your risk for type 2 diabetes, heart disease, and stroke.  With the DASH eating plan, you should limit salt (sodium) intake to 2,300 mg a day. If you have hypertension, you may need to reduce your sodium intake to 1,500 mg a day.  When on the DASH eating plan, aim to eat more fresh fruits and vegetables, whole grains, lean proteins, low-fat dairy, and heart-healthy fats.  Work with your health care provider or diet and nutrition specialist  (dietitian) to adjust your eating plan to your individual calorie needs. This information is not intended to replace advice given to you by your health care provider. Make sure you discuss any questions you have with your health care provider. Document Revised: 03/20/2017 Document Reviewed: 03/31/2016 Elsevier Patient Education  2020 [employer].

## 2020-05-01 ENCOUNTER — Telehealth: Payer: Self-pay

## 2020-05-01 NOTE — Progress Notes (Signed)
Chronic Care Management Pharmacy Assistant   Name: Carla Romero  MRN: [id number] DOB: May 12, 1945  Reason for Encounter: Disease State for hypertension.  Patient Questions:  1.  Have you seen any other providers since your last visit? No  2.  Any changes in your medicines or health? Yes, Amlodipine was increased to 10mg      PCP : , MD  Allergies:   Allergies  Allergen Reactions   Ace Inhibitors Cough   Trilipix [Choline Fenofibrate] Other (See Comments)    Muscle cramps, muscle stiffness   Zocor [Simvastatin] Other (See Comments)    Myalgia    Medications: Outpatient Encounter Medications as of 05/01/2020  Medication Sig Note   alum & mag hydroxide-simeth (MAALOX/MYLANTA) 200-200-20 MG/5ML suspension Take 30 mLs by mouth 2 (two) times daily as needed for indigestion or heartburn.  (Patient not taking: Reported on 04/24/2020)    amLODipine (NORVASC) 10 MG tablet Take 1 tablet (10 mg total) by mouth daily. (Patient not taking: Reported on 04/24/2020)    Artificial Tear Solution (SOOTHE XP OP) Apply 1 drop to eye daily as needed (dry eyes).    aspirin EC 81 MG tablet Take 81 mg by mouth daily.    B COMPLEX VITAMINS SL Place 1 tablet under the tongue daily.    Calcium Citrate-Vitamin D (CALCIUM + D PO) Take 1 tablet by mouth 3 (three) times a week. (Patient not taking: Reported on 04/24/2020) 06/19/2016: Pt puts this medication on hold if she has any stomach problems   clopidogrel (PLAVIX) 75 MG tablet TAKE 1 TABLET BY MOUTH ONCE DAILY.    diphenhydrAMINE (BENADRYL) 25 MG tablet Take 25 mg by mouth at bedtime as needed for allergies. (Patient not taking: Reported on 04/24/2020)    ezetimibe (ZETIA) 10 MG tablet TAKE 1 TABLET BY MOUTH AT BEDTIME    ibuprofen (ADVIL,MOTRIN) 200 MG tablet Take 400 mg by mouth 2 (two) times daily as needed for headache or moderate pain. (Patient not taking: Reported on 04/24/2020)    Lidocaine 4 % PTCH Apply 1 patch topically daily  as needed (pain). (Patient not taking: Reported on 04/24/2020)    magnesium hydroxide (MILK OF MAGNESIA) 400 MG/5ML suspension Take by mouth daily as needed for mild constipation.    rosuvastatin (CRESTOR) 5 MG tablet Take 5mg  three times weekly    trolamine salicylate (ASPERCREME) 10 % cream Apply 1 application topically as needed for muscle pain. (Patient not taking: Reported on 04/24/2020)    valsartan (DIOVAN) 320 MG tablet TAKE 1 TABLET BY MOUTH ONCE DAILY    No facility-administered encounter medications on file as of 05/01/2020.    Current Diagnosis: Patient Active Problem List   Diagnosis Date Noted   Essential hypertension 12/18/2019   Mixed hyperlipidemia 12/18/2019   PVD (peripheral vascular disease) (HCC) 12/18/2019   Annual physical exam 10/12/2019   Cigarette nicotine dependence with nicotine-induced disorder 10/12/2019   Bleeding at insertion site 06/24/2016   Aortitis (HCC) 11/28/2013   Reviewed chart prior to disease state call. Spoke with patient regarding BP  Recent Office Vitals: BP Readings from Last 3 Encounters:  03/21/20 (!) 160/64  03/14/20 (!) 170/76  12/13/19 136/80   Pulse Readings from Last 3 Encounters:  03/21/20 72  03/14/20 61  12/13/19 70    Wt Readings from Last 3 Encounters:  03/21/20 121 lb 9.6 oz (55.2 kg)  03/14/20 114 lb 8 oz (51.9 kg)  12/13/19 117 lb (53.1 kg)     Kidney Function  Lab Results  Component Value Date/Time   CREATININE 1.08 (H) 03/21/2020 08:16 AM   CREATININE 0.92 12/13/2019 10:05 AM   CREATININE 1.00 01/30/2014 09:45 AM   GFRNONAA 51 (L) 03/21/2020 08:16 AM   GFRAA 59 (L) 03/21/2020 08:16 AM    BMP Latest Ref Rng & Units 03/21/2020 12/13/2019 06/24/2016  Glucose 65 - 99 mg/dL 82 82 96  BUN 8 - 27 mg/dL 9 9 10   Creatinine 0.57 - 1.00 mg/dL ) 7.01(X 7.93  BUN/Creat Ratio 12 - 28 8(L) 10(L) -  Sodium 134 - 144 mmol/L 142 140 139  Potassium 3.5 - 5.2 mmol/L 4.5 4.3 3.4(L)  Chloride 96 - 106 mmol/L 106 103  100(L)  CO2 20 - 29 mmol/L 20 23 -  Calcium 8.7 - 10.3 mg/dL 9.5 9.2 -     Current antihypertensive regimen:  Amlodipine 10mg  Valsartan 320mg    How often are you checking your Blood Pressure? daily    Current home BP readings: Patient stated the most recent numbers she has is 04/29/20   169/63 (she had not taken her medication yet.  04/01/20 -  139/67       03/31/20 -  146/64   What recent interventions/DTPs have been made by any provider to improve Blood Pressure control since last CPP Visit: Patient is trying to eat healthy bust states when she is stressed her blood pressure goes up.   Any recent hospitalizations or ED visits since last visit with CPP? No   Adherence Review: Is the patient currently on ACE/ARB medication? Yes Does the patient have >5 day gap between last estimated fill dates? No   Follow-Up:  Pharmacist Review   06/27/20, CPP , notified  14/12/21, [facility] Clinical Pharmacist Assistant [phone]

## 2020-05-31 ENCOUNTER — Other Ambulatory Visit: Payer: Self-pay | Admitting: Family Medicine

## 2020-07-02 ENCOUNTER — Other Ambulatory Visit: Payer: Self-pay | Admitting: Family Medicine

## 2020-07-16 ENCOUNTER — Other Ambulatory Visit: Payer: Self-pay | Admitting: Family Medicine

## 2020-07-23 ENCOUNTER — Ambulatory Visit (INDEPENDENT_AMBULATORY_CARE_PROVIDER_SITE_OTHER): Payer: PPO

## 2020-07-23 ENCOUNTER — Other Ambulatory Visit: Payer: Self-pay

## 2020-07-23 DIAGNOSIS — I10 Essential (primary) hypertension: Secondary | ICD-10-CM

## 2020-07-23 DIAGNOSIS — E782 Mixed hyperlipidemia: Secondary | ICD-10-CM | POA: Diagnosis not present

## 2020-07-23 NOTE — Patient Instructions (Addendum)
Visit Information  Goals Addressed            This Visit's Progress   . Learn More About My Health       Timeframe:  Long-Range Goal Priority:  High Start Date:  07/23/20                    Expected End Date:  07/23/2021    Follow Up Date 01/22/2021    - ask questions - repeat what I heard to make sure I understand - bring a list of my medicines to the visit    Why is this important?    The best way to learn about your health and care is by talking to the doctor and nurse.   They will answer your questions and give you information in the way that you like best.    Notes:     [name] Manage My Medicine       Timeframe:  Long-Range Goal Priority:  High Start Date:       07/23/2020                      Expected End Date:     07/23/2021                  Follow Up Date 01/22/2021    - call for medicine refill 2 or 3 days before it runs out - keep a list of all the medicines I take; vitamins and herbals too - use a pillbox to sort medicine    Why is this important?   . These steps will help you keep on track with your medicines.   Notes:     . Track and Manage My Blood Pressure-Hypertension       Timeframe:  Long-Range Goal Priority:  High Start Date:         07/23/2020                    Expected End Date:     07/23/2021                  Follow Up Date 01/22/2021    - check blood pressure 3 times per week - choose a place to take my blood pressure (home, clinic or office, retail store)    Why is this important?    You won't feel high blood pressure, but it can still hurt your blood vessels.   High blood pressure can cause heart or kidney problems. It can also cause a stroke.   Making lifestyle changes like losing a little weight or eating less salt will help.   Checking your blood pressure at home and at different times of the day can help to control blood pressure.   If the doctor prescribes medicine remember to take it the way the doctor ordered.   Call  the office if you cannot afford the medicine or if there are questions about it.     Notes:       Patient Care Plan: CCM Pharmacy Care Plan    Problem Identified: htn, hld   Priority: High  Onset Date: 07/23/2020    Goal: Disease Management   Start Date: 07/23/2020  Expected End Date: 07/23/2021  This Visit's Progress: On track  Priority: High  Note:    Current Barriers:  . Does not maintain contact with provider office   Pharmacist Clinical Goal(s):  [name] Patient will schedule follow-up fasting  blood work and chronic appointment.  through collaboration with PharmD and provider.   Interventions: . 1:1 collaboration with Blane Ohara, MD regarding development and update of comprehensive plan of care as evidenced by provider attestation and co-signature . Inter-disciplinary care team collaboration (see longitudinal plan of care) . Comprehensive medication review performed; medication list updated in electronic medical record  Hypertension (BP goal <130/80) -Controlled -Current treatment: . valsartan 320 mg daily  . Amlodipine 10 mg daily  -Medications previously tried: ace inhibitors  -Current home readings: 133/63, 132/65, 129/57, 126/56 -Current dietary habits: drinking Boost daily. Has had a hard time finding boost for women.  -Current exercise habits: line dances, walking and completing exercise.  -Denies hypotensive/hypertensive symptoms -Educated on BP goals and benefits of medications for prevention of heart attack, stroke and kidney damage; Daily salt intake goal < 2300 mg; Exercise goal of 150 minutes per week; Importance of home blood pressure monitoring; -Counseled to monitor BP at home daily, document, and provide log at future appointments -Counseled on diet and exercise extensively Recommended to continue current medication  Hyperlipidemia: (LDL goal < 100) -Not ideally controlled -Current treatment: . Zetia 10 mg daily at bedtime  . Crestor 5 mg three times  weekly -Medications previously tried: simvastatin   -Current dietary patterns: drinks 1 boost daily. Eats quick meals otherwise.  -Current exercise habits: line dancing, walking and exercises.  -Educated on Cholesterol goals;  Benefits of statin for ASCVD risk reduction; Importance of limiting foods high in cholesterol; Exercise goal of 150 minutes per week; -Counseled on diet and exercise extensively Recommended to continue current medication  PVD (Goal: manage bp, cholesterol, anticoagulation) -Not ideally controlled -Current treatment  . aspirin ec 81 mg daily  . Clopidogrel 75 mg daily  . Zetia 10 mg daily at bedtime  . Crestor 5 mg three times weekly . Amlodipine 10 mg daily  . Valsartan 320 mg daily  -Medications previously tried: simvastatin  -Counseled on diet and exercise extensively Recommended to continue current medication   Patient Goals/Self-Care Activities . Patient will:  - take medications as prescribed focus on medication adherence by using pill box check blood pressure daily , document, and provide at future appointments target a minimum of 150 minutes of moderate intensity exercise weekly  Follow Up Plan: Telephone follow up appointment with care management team member scheduled for: 01/22/2021      The patient verbalized understanding of instructions, educational materials, and care plan provided today and declined offer to receive copy of patient instructions, educational materials, and care plan.  Telephone follow up appointment with pharmacy team member scheduled for: 01/22/2021  Earvin Hansen, [facility]  Cholesterol Content in Foods Cholesterol is a waxy, fat-like substance that helps to carry fat in the blood. The body needs cholesterol in small amounts, but too much cholesterol can cause damage to the arteries and heart. Most people should eat less than 200 milligrams (mg) of cholesterol a day. Foods with cholesterol Cholesterol is found in  animal-based foods, such as meat, seafood, and dairy. Generally, low-fat dairy and lean meats have less cholesterol than full-fat dairy and fatty meats. The milligrams of cholesterol per serving (mg per serving) of common cholesterol-containing foods are listed below. Meat and other proteins  Egg -- one large whole egg has 186 mg.  Veal shank -- 4 oz has 141 mg.  Lean ground [place] (93% lean) -- 4 oz has 118 mg.  Fat-trimmed lamb loin -- 4 oz has 106 mg.  Lean ground beef (  90% lean) -- 4 oz has 100 mg.  Lobster -- 3.5 oz has 90 mg.  Pork loin chops -- 4 oz has 86 mg.  Canned salmon -- 3.5 oz has 83 mg.  Fat-trimmed beef top loin -- 4 oz has 78 mg.  Frankfurter -- 1 frank (3.5 oz) has 77 mg.  Crab -- 3.5 oz has 71 mg.  Roasted chicken without skin, white meat -- 4 oz has 66 mg.  Light bologna -- 2 oz has 45 mg.  Deli-cut [place] -- 2 oz has 31 mg.  Canned tuna -- 3.5 oz has 31 mg.  Tomasa Blase -- 1 oz has 29 mg.  Oysters and mussels (raw) -- 3.5 oz has 25 mg.  Mackerel -- 1 oz has 22 mg.  Trout -- 1 oz has 20 mg.  Pork sausage -- 1 link (1 oz) has 17 mg.  Salmon -- 1 oz has 16 mg.  Tilapia -- 1 oz has 14 mg. Dairy  Soft-serve ice cream --  cup (4 oz) has 103 mg.  Whole-milk yogurt -- 1 cup (8 oz) has 29 mg.  Cheddar cheese -- 1 oz has 28 mg.  American cheese -- 1 oz has 28 mg.  Whole milk -- 1 cup (8 oz) has 23 mg.  2% milk -- 1 cup (8 oz) has 18 mg.  Cream cheese -- 1 tablespoon (Tbsp) has 15 mg.  Cottage cheese --  cup (4 oz) has 14 mg.  Low-fat (1%) milk -- 1 cup (8 oz) has 10 mg.  Sour cream -- 1 Tbsp has 8.5 mg.  Low-fat yogurt -- 1 cup (8 oz) has 8 mg.  Nonfat Greek yogurt -- 1 cup (8 oz) has 7 mg.  Half-and-half cream -- 1 Tbsp has 5 mg. Fats and oils  Cod liver oil -- 1 tablespoon (Tbsp) has 82 mg.  Butter -- 1 Tbsp has 15 mg.  Lard -- 1 Tbsp has 14 mg.  Bacon grease -- 1 Tbsp has 14 mg.  Mayonnaise -- 1 Tbsp has 5-10  mg.  Margarine -- 1 Tbsp has 3-10 mg. Exact amounts of cholesterol in these foods may vary depending on specific ingredients and brands.   Foods without cholesterol Most plant-based foods do not have cholesterol unless you combine them with a food that has cholesterol. Foods without cholesterol include:  Grains and cereals.  Vegetables.  Fruits.  Vegetable oils, such as olive, canola, and sunflower oil.  Legumes, such as peas, beans, and lentils.  Nuts and seeds.  Egg whites.   Summary  The body needs cholesterol in small amounts, but too much cholesterol can cause damage to the arteries and heart.  Most people should eat less than 200 milligrams (mg) of cholesterol a day. This information is not intended to replace advice given to you by your health care provider. Make sure you discuss any questions you have with your health care provider. Document Revised: 08/29/2019 Document Reviewed: 08/29/2019 Elsevier Patient Education  2021 [employer].

## 2020-07-23 NOTE — Progress Notes (Addendum)
Chronic Care Management Pharmacy Note  07/23/2020 Name:  Carla Romero MRN:  [id number] DOB:  12-25-1945   Plan Updates:   Reviewed patient blood pressure readings. Blood pressure readings reported are well controlled.   Coordinated fasting follow-up with Dr. Tobie Poet (patient overdue)  Subjective: Carla Romero is an 75 y.o. year old female who is a primary patient of Cox, Kirsten, MD.  The CCM team was consulted for assistance with disease management and care coordination needs.    Engaged with patient by telephone for follow up visit in response to provider referral for pharmacy case management and/or care coordination services.   Consent to Services:  The patient was given information about Chronic Care Management services, agreed to services, and gave verbal consent prior to initiation of services.  Please see initial visit note for detailed documentation.   Patient Care Team: [name], MD as PCP - General (Family Medicine) Linard Millers as Physician Assistant (Family Medicine) Burnice Logan, [facility] as Pharmacist (Pharmacist)  Recent office visits: Needs appt with Dr. Tobie Poet. Last visit 03/21/2020 with increase dose of amlodipine to 10 mg daily and follow-up 2 weeks.   Recent consult visits: None since last visit  Hospital visits: None in previous 6 months  Objective:  Lab Results  Component Value Date   CREATININE 1.08 (H) 03/21/2020   BUN 9 03/21/2020   GFRNONAA 51 (L) 03/21/2020   GFRAA 59 (L) 03/21/2020   NA 142 03/21/2020   K 4.5 03/21/2020   CALCIUM 9.5 03/21/2020   CO2 20 03/21/2020   GLUCOSE 82 03/21/2020    No results found for: HGBA1C, FRUCTOSAMINE, GFR, MICROALBUR  Last diabetic Eye exam: No results found for: HMDIABEYEEXA  Last diabetic Foot exam: No results found for: HMDIABFOOTEX   Lab Results  Component Value Date   CHOL 207 (H) 03/21/2020   HDL 54 03/21/2020   LDLCALC 132 (H) 03/21/2020   TRIG 117 03/21/2020   CHOLHDL 3.8  03/21/2020    Hepatic Function Latest Ref Rng & Units 03/21/2020 12/13/2019  Total Protein 6.0 - 8.5 g/dL 7.0 6.7  Albumin 3.7 - 4.7 g/dL 4.6 4.3  AST 0 - 40 IU/L 21 19  ALT 0 - 32 IU/L 13 10  Alk Phosphatase 44 - 121 IU/L 113 92  Total Bilirubin 0.0 - 1.2 mg/dL 0.3 0.2    Lab Results  Component Value Date/Time   TSH 2.110 03/21/2020 08:16 AM    CBC Latest Ref Rng & Units 03/21/2020 12/13/2019 06/24/2016  WBC 3.4 - 10.8 x10E3/uL 9.0 7.0 -  Hemoglobin 11.1 - 15.9 g/dL 13.5 13.2 13.6  Hematocrit 34.0 - 46.6 % 38.7 37.5 40.0  Platelets 150 - 450 x10E3/uL 253 261 -    No results found for: VD25OH  Clinical ASCVD: No  The 10-year ASCVD risk score Mikey Bussing DC Jr., et al., 2013) is: 39.4%   Values used to calculate the score:     Age: 59 years     Sex: Female     Is Non-Hispanic African American: No     Diabetic: No     Tobacco smoker: Yes     Systolic Blood Pressure: 786 mmHg     Is BP treated: Yes     HDL Cholesterol: 54 mg/dL     Total Cholesterol: 207 mg/dL    Depression screen [facility] 2/9 12/14/2019 10/11/2019  Decreased Interest - 0  Down, Depressed, Hopeless - 0  PHQ - 2 Score - 0  Altered sleeping 0 -  Tired, decreased energy 1 -  Change in appetite 0 -  Moving slowly or fidgety/restless 1 -  Suicidal thoughts 0 -  Difficult doing work/chores Not difficult at all -     Social History   Tobacco Use  Smoking Status Current Every Day Smoker  . Packs/day: 0.12  . Years: 43.00  . Pack years: 5.16  . Types: Cigarettes  Smokeless Tobacco Never Used  Tobacco Comment   2.5-5 cigarettes per day   BP Readings from Last 3 Encounters:  03/21/20 (!) 160/64  03/14/20 (!) 170/76  12/13/19 136/80   Pulse Readings from Last 3 Encounters:  03/21/20 72  03/14/20 61  12/13/19 70   Wt Readings from Last 3 Encounters:  03/21/20 121 lb 9.6 oz (55.2 kg)  03/14/20 114 lb 8 oz (51.9 kg)  12/13/19 117 lb (53.1 kg)   BMI Readings from Last 3 Encounters:  03/21/20 22.24 kg/m   03/14/20 20.94 kg/m  12/13/19 21.40 kg/m    Assessment/Interventions: Review of patient past medical history, allergies, medications, health status, including review of consultants reports, laboratory and other test data, was performed as part of comprehensive evaluation and provision of chronic care management services.   SDOH:  (Social Determinants of Health) assessments and interventions performed: Yes  SDOH Screenings   Alcohol Screen: Not on file  Depression (PHQ2-9): Low Risk   . PHQ-2 Score: 2  Financial Resource Strain: Not on file  Food Insecurity: No Food Insecurity  . Worried About Charity fundraiser in the Last Year: Never true  . Ran Out of Food in the Last Year: Never true  Housing: Low Risk   . Last Housing Risk Score: 0  Physical Activity: Not on file  Social Connections: Not on file  Stress: Not on file  Tobacco Use: High Risk  . Smoking Tobacco Use: Current Every Day Smoker  . Smokeless Tobacco Use: Never Used  Transportation Needs: No Transportation Needs  . Lack of Transportation (Medical): No  . Lack of Transportation (Non-Medical): No    CCM Care Plan  Allergies  Allergen Reactions  . Ace Inhibitors Cough  . Trilipix [Choline Fenofibrate] Other (See Comments)    Muscle cramps, muscle stiffness  . Zocor [Simvastatin] Other (See Comments)    Myalgia    Medications Reviewed Today    Reviewed by Burnice Logan, [facility] (Pharmacist) on 07/23/20 at [place] List Status: <None>  Medication Order Taking? Sig Documenting Provider Last Dose Status Informant  alum & mag hydroxide-simeth (MAALOX/MYLANTA) 200-200-20 MG/5ML suspension [id number] Yes Take 30 mLs by mouth 2 (two) times daily as needed for indigestion or heartburn. [provider] Taking Active   amLODipine (NORVASC) 10 MG tablet [id number] Yes Take 1 tablet (10 mg total) by mouth daily. Cox, Kirsten, MD Taking Active   Artificial Tear Solution (SOOTHE XP OP) [id number] Yes Apply 1 drop to  eye daily as needed (dry eyes). [provider] Taking Active Self  aspirin EC 81 MG tablet [id number] Yes Take 81 mg by mouth daily. [provider] Taking Active Self  B COMPLEX VITAMINS SL [id number] Yes Place 1 tablet under the tongue daily. [provider] Taking Active   Calcium Citrate-Vitamin D (CALCIUM + D PO) [id number] No Take 1 tablet by mouth 3 (three) times a week.  Patient not taking: No sig reported   [provider] Not Taking Active            Med Note Caryn Section, KYLE A  Thu Jun 19, 2016  1:19 PM) Pt puts this medication on hold if she has any stomach problems  cholecalciferol (VITAMIN D3) 25 MCG (1000 UNIT) tablet [id number] Yes Take 1,000 Units by mouth daily. [provider] Taking Active   clopidogrel (PLAVIX) 75 MG tablet [id number] Yes TAKE 1 TABLET BY MOUTH ONCE DAILY. Cox, Kirsten, MD Taking Active   diphenhydrAMINE (BENADRYL) 25 MG tablet [id number] No Take 25 mg by mouth at bedtime as needed for allergies.  Patient not taking: No sig reported   [provider] Not Taking Active   ezetimibe (ZETIA) 10 MG tablet [id number] Yes TAKE 1 TABLET BY MOUTH AT BEDTIME Cox, Kirsten, MD Taking Active   ibuprofen (ADVIL,MOTRIN) 200 MG tablet [id number] No Take 400 mg by mouth 2 (two) times daily as needed for headache or moderate pain.  Patient not taking: No sig reported   [provider] Not Taking Active   Lidocaine 4 % PTCH [id number] No Apply 1 patch topically daily as needed (pain).  Patient not taking: No sig reported   [provider] Not Taking Active   magnesium gluconate (MAGONATE) 500 MG tablet [id number] Yes Take 500 mg by mouth daily. [provider] Taking Active   magnesium hydroxide (MILK OF MAGNESIA) 400 MG/5ML suspension [id number] No Take by mouth daily as needed for mild constipation.  Patient not taking: Reported on 07/23/2020   [provider] Not Taking Active   rosuvastatin  (CRESTOR) 5 MG tablet [id number] Yes TAKE 1 TABLET BY MOUTH 3 TIMES WEEKLY. Marge Duncans, PA-C Taking Active   trolamine salicylate (ASPERCREME) 10 % cream [id number] No Apply 1 application topically as needed for muscle pain.  Patient not taking: No sig reported   [provider] Not Taking Active   valsartan (DIOVAN) 320 MG tablet [id number] Yes TAKE 1 TABLET BY MOUTH ONCE DAILY Cox, Kirsten, MD Taking Active   vitamin C (ASCORBIC ACID) 500 MG tablet [id number] Yes Take 500 mg by mouth daily. [provider] Taking Active           Patient Active Problem List   Diagnosis Date Noted  . Essential hypertension 12/18/2019  . Mixed hyperlipidemia 12/18/2019  . PVD (peripheral vascular disease) ([place]) 12/18/2019  . Annual physical exam 10/12/2019  . Cigarette nicotine dependence with nicotine-induced disorder 10/12/2019  . Bleeding at insertion site 06/24/2016  . 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 Moderna SARS-COV2 Booster Vaccination 03/20/2020  . Moderna Sars-Covid-2 Vaccination 06/03/2019, 07/01/2019  . Pneumococcal Conjugate-13 03/28/2015  . Pneumococcal Polysaccharide-23 03/09/2011    Conditions to be addressed/monitored:  Hypertension, Hyperlipidemia and Peripheral Vascular Disease  Care Plan : CCM Pharmacy Care Plan  Updates made by Burnice Logan, RPH since 07/23/2020 12:00 AM    Problem: htn, hld   Priority: High  Onset Date: 07/23/2020    Goal: Disease Management   Start Date: 07/23/2020  Expected End Date: 07/23/2021  This Visit's Progress: On track  Priority: High  Note:    Current Barriers:  . Does not maintain contact with provider office   Pharmacist Clinical Goal(s):  [name] Patient will schedule follow-up fasting blood work and chronic appointment.  through collaboration with PharmD and provider.   Interventions: . 1:1 collaboration with [name], MD regarding development and update of  comprehensive plan of care as evidenced by provider attestation and co-signature . Inter-disciplinary care team collaboration (see longitudinal plan of care) . Comprehensive medication review performed; medication list  updated in electronic medical record  Hypertension (BP goal <130/80) -Controlled -Current treatment: . valsartan 320 mg daily  . Amlodipine 10 mg daily  -Medications previously tried: ace inhibitors  -Current home readings: 133/63, 132/65, 129/57, 126/56 -Current dietary habits: drinking Boost daily. Has had a hard time finding boost for women.  -Current exercise habits: line dances, walking and completing exercise.  -Denies hypotensive/hypertensive symptoms -Educated on BP goals and benefits of medications for prevention of heart attack, stroke and kidney damage; Daily salt intake goal < 2300 mg; Exercise goal of 150 minutes per week; Importance of home blood pressure monitoring; -Counseled to monitor BP at home daily, document, and provide log at future appointments -Counseled on diet and exercise extensively Recommended to continue current medication  Hyperlipidemia: (LDL goal < 100) -Not ideally controlled -Current treatment: . Zetia 10 mg daily at bedtime  . Crestor 5 mg three times weekly -Medications previously tried: simvastatin   -Current dietary patterns: drinks 1 boost daily. Eats quick meals otherwise.  -Current exercise habits: line dancing, walking and exercises.  -Educated on Cholesterol goals;  Benefits of statin for ASCVD risk reduction; Importance of limiting foods high in cholesterol; Exercise goal of 150 minutes per week; -Counseled on diet and exercise extensively Recommended to continue current medication  PVD (Goal: manage bp, cholesterol, anticoagulation) -Not ideally controlled -Current treatment  . aspirin ec 81 mg daily  . Clopidogrel 75 mg daily  . Zetia 10 mg daily at bedtime  . Crestor 5 mg three times weekly . Amlodipine 10 mg  daily  . Valsartan 320 mg daily  -Medications previously tried: simvastatin  -Counseled on diet and exercise extensively Recommended to continue current medication   Patient Goals/Self-Care Activities . Patient will:  - take medications as prescribed focus on medication adherence by using pill box check blood pressure daily , document, and provide at future appointments target a minimum of 150 minutes of moderate intensity exercise weekly  Follow Up Plan: Telephone follow up appointment with care management team member scheduled for: 01/22/2021      Medication Assistance: None required.  Patient affirms current coverage meets needs.  Patient's preferred pharmacy is:  [facility], [place] [address] Phone: [phone] Fax: [phone]  [facility], [address] [address] Phone: [phone] Fax: [phone]  Uses pill box? Yes Pt endorses good% compliance  We discussed: Current pharmacy is preferred with insurance plan and patient is satisfied with pharmacy services Patient decided to: Continue current medication management strategy  Care Plan and Follow Up Patient Decision:  Patient agrees to Care Plan and Follow-up.  Plan: Telephone follow up appointment with care management team member scheduled for:  01/22/2021

## 2020-07-26 ENCOUNTER — Ambulatory Visit: Payer: PPO | Admitting: Family Medicine

## 2020-07-26 ENCOUNTER — Ambulatory Visit: Payer: PPO | Admitting: Nurse Practitioner

## 2020-07-28 ENCOUNTER — Other Ambulatory Visit: Payer: Self-pay | Admitting: Family Medicine

## 2020-08-20 NOTE — Progress Notes (Signed)
Subjective:  Patient ID: Carla Romero, female    DOB: [birth year]  Age: 75 y.o. MRN: [id number]  Chief Complaint  Patient presents with  . Hypertension  . Hyperlipidemia    HPI Essential hypertension: on amlodipine 10 mg once daily and valsartan 320 mg once daily.  Mixed hyperlipidemia: increased crestor 5 mg twice weekly to  to three times weekly and zetia 10 mg once daily. Not eating healthy, but is exercising.  PVD: stent in left leg. Rt leg has some blockages. Management by vascular surgery  Chronic idiopathic constipation: Takes 2 magnesium pills every day. Takes fiber. Before bed takes mylanta.     Current Outpatient Medications on File Prior to Visit  Medication Sig Dispense Refill  . alum & mag hydroxide-simeth (MAALOX/MYLANTA) 200-200-20 MG/5ML suspension Take 30 mLs by mouth 2 (two) times daily as needed for indigestion or heartburn.    [name] amLODipine (NORVASC) 10 MG tablet TAKE 1 TABLET BY MOUTH ONCE DAILY. 90 tablet 0  . Artificial Tear Solution (SOOTHE XP OP) Apply 1 drop to eye daily as needed (dry eyes).    [name] aspirin EC 81 MG tablet Take 81 mg by mouth daily.    . B COMPLEX VITAMINS SL Place 1 tablet under the tongue daily.    . Calcium Citrate-Vitamin D (CALCIUM + D PO) Take 1 tablet by mouth 3 (three) times a week. (Patient not taking: No sig reported)    . cholecalciferol (VITAMIN D3) 25 MCG (1000 UNIT) tablet Take 1,000 Units by mouth daily.    . clopidogrel (PLAVIX) 75 MG tablet TAKE 1 TABLET BY MOUTH ONCE DAILY. 30 tablet 10  . diphenhydrAMINE (BENADRYL) 25 MG tablet Take 25 mg by mouth at bedtime as needed for allergies. (Patient not taking: No sig reported)    . ezetimibe (ZETIA) 10 MG tablet TAKE 1 TABLET BY MOUTH AT BEDTIME 90 tablet 0  . ibuprofen (ADVIL,MOTRIN) 200 MG tablet Take 400 mg by mouth 2 (two) times daily as needed for headache or moderate pain. (Patient not taking: No sig reported)    . Lidocaine 4 % PTCH Apply 1 patch topically daily as needed  (pain). (Patient not taking: No sig reported)    . magnesium gluconate (MAGONATE) 500 MG tablet Take 500 mg by mouth daily.    . magnesium hydroxide (MILK OF MAGNESIA) 400 MG/5ML suspension Take by mouth daily as needed for mild constipation. (Patient not taking: Reported on 07/23/2020)    . rosuvastatin (CRESTOR) 5 MG tablet TAKE 1 TABLET BY MOUTH 3 TIMES WEEKLY. 30 tablet 0  . trolamine salicylate (ASPERCREME) 10 % cream Apply 1 application topically as needed for muscle pain. (Patient not taking: No sig reported)    . valsartan (DIOVAN) 320 MG tablet TAKE 1 TABLET BY MOUTH ONCE DAILY 90 tablet 0  . vitamin C (ASCORBIC ACID) 500 MG tablet Take 500 mg by mouth daily.     No current facility-administered medications on file prior to visit.   Past Medical History:  Diagnosis Date  . Acute viral syndrome   . Anemia    Vit D deficiency  . Arteritis, unspecified (HCC) November 01, 2013  . Arthritis    "fingers, knees, upper spine" (06/24/2016)  . B12 deficiency anemia    "had injections in the 1970s"  . CAD (coronary artery disease)   . COPD (chronic obstructive pulmonary disease) (HCC)    "no one has ever told me I have this" (06/24/2016)  . Diverticulitis   . GERD (  gastroesophageal reflux disease)   . Hyperlipidemia   . Hypertension   . Osteoporosis   . Pain October 28, 2013   Left Lower quadrant / back  . Thrombocytopenia (HCC)    Past Surgical History:  Procedure Laterality Date  . ABDOMINAL AORTOGRAM W/LOWER EXTREMITY N/A 06/24/2016   Procedure: Abdominal Aortogram w/Lower Extremity;  Surgeon: Nada Libman, MD;  Location: [facility];  Service: Cardiovascular;  Laterality: N/A;  . ABDOMINAL HYSTERECTOMY    . APPENDECTOMY    . COLONOSCOPY  Aug. 4, 2014  . FEMORAL ARTERY STENT Left 2018   [employer] 6 mm x 80 mm x 130 cm. Dr Myra Gianotti [phone]  . TUBAL LIGATION      Family History  Problem Relation Age of Onset  . Hypertension Mother   . Hyperlipidemia Mother     Social History   Socioeconomic History  . Marital status: Widowed    Spouse name: Not on file  . Number of children: Not on file  . Years of education: Not on file  . Highest education level: Not on file  Occupational History  . Not on file  Tobacco Use  . Smoking status: Current Every Day Smoker    Packs/day: 0.12    Years: 43.00    Pack years: 5.16    Types: Cigarettes  . Smokeless tobacco: Never Used  . Tobacco comment: 2.5-5 cigarettes per day  Vaping Use  . Vaping Use: Never used  Substance and Sexual Activity  . Alcohol use: Yes    Alcohol/week: 2.0 - 3.0 standard drinks    Types: 2 - 3 Standard drinks or equivalent per week    Comment: 06/24/2016 "might have a few drinks/year"  . Drug use: No  . Sexual activity: Never  Other Topics Concern  . Not on file  Social History Narrative  . Not on file   Social Determinants of Health   Financial Resource Strain: Not on file  Food Insecurity: No Food Insecurity  . Worried About Programme researcher, broadcasting/film/video in the Last Year: Never true  . Ran Out of Food in the Last Year: Never true  Transportation Needs: No Transportation Needs  . Lack of Transportation (Medical): No  . Lack of Transportation (Non-Medical): No  Physical Activity: Not on file  Stress: Not on file  Social Connections: Not on file    Review of Systems  Constitutional: Negative for chills, fatigue and fever.  HENT: Negative for congestion, ear pain, rhinorrhea and sore throat.   Respiratory: Negative for cough and shortness of breath.   Cardiovascular: Negative for chest pain.  Gastrointestinal: Negative for abdominal pain, constipation, diarrhea, nausea and vomiting.  Genitourinary: Negative for dysuria and urgency.  Musculoskeletal: Positive for back pain (of and on. Lifted her 70 lb dog. ). Negative for myalgias.  Neurological: Positive for headaches. Negative for dizziness, weakness and light-headedness.  Psychiatric/Behavioral: Negative for dysphoric  mood. The patient is not nervous/anxious.      Objective:  BP 124/66   Pulse (!) 59   Temp (!) 97.4 F (36.3 C)   Resp 18   Wt 124 lb (56.2 kg)   SpO2 98%   BMI 22.68 kg/m   BP/Weight 08/21/2020 03/21/2020 03/14/2020  Systolic BP 124 160 170  Diastolic BP 66 64 76  Wt. (Lbs) 124 121.6 114.5  BMI 22.68 22.24 20.94    Physical Exam Vitals reviewed.  Constitutional:      Appearance: Normal appearance. She is normal weight.  Neck:  Vascular: No carotid bruit.  Cardiovascular:     Rate and Rhythm: Normal rate and regular rhythm.     Pulses: Normal pulses.     Heart sounds: Normal heart sounds.  Pulmonary:     Effort: Pulmonary effort is normal. No respiratory distress.     Breath sounds: Normal breath sounds.  Abdominal:     General: Abdomen is flat. Bowel sounds are normal.     Palpations: Abdomen is soft.     Tenderness: There is no abdominal tenderness.  Neurological:     Mental Status: She is alert and oriented to person, place, and time.  Psychiatric:        Mood and Affect: Mood normal.        Behavior: Behavior normal.     Diabetic Foot Exam - Simple   No data filed      Lab Results  Component Value Date   WBC 9.0 03/21/2020   HGB 13.5 03/21/2020   HCT 38.7 03/21/2020   PLT 253 03/21/2020   GLUCOSE 82 03/21/2020   CHOL 207 (H) 03/21/2020   TRIG 117 03/21/2020   HDL 54 03/21/2020   LDLCALC 132 (H) 03/21/2020   ALT 13 03/21/2020   AST 21 03/21/2020   NA 142 03/21/2020   K 4.5 03/21/2020   CL 106 03/21/2020   CREATININE 1.08 (H) 03/21/2020   BUN 9 03/21/2020   CO2 20 03/21/2020   TSH 2.110 03/21/2020      Assessment & Plan:   1. Essential hypertension Well controlled.  No changes to medicines.  Continue to work on eating a healthy diet and exercise.  Labs drawn today.  - Comprehensive metabolic panel  2. Mixed hyperlipidemia Await labs to determine recommendations. Recommend exercise and eating healthy.  - CBC with  Differential/Platelet - Lipid panel  3. Chronic idiopathic constipation The current medical regimen is effective;  continue present plan and medications.  4. PVD (peripheral vascular disease) (HCC) The current medical regimen is effective;  continue present plan and medications. Follow up with vascular surgeon.  5. Cigarette nicotine dependence with nicotine-induced disorder  Recommend cessation, refused.     Orders Placed This Encounter  Procedures  . CBC with Differential/Platelet  . Comprehensive metabolic panel  . Lipid panel     Follow-up: Return in about 8 weeks (around 10/15/2020) for Annual wellness visit with Selena Batten and fasting visit with me in 3 months..  An After Visit Summary was printed and given to the patient.  Blane Ohara, MD [facility] [phone]

## 2020-08-21 ENCOUNTER — Encounter: Payer: Self-pay | Admitting: Family Medicine

## 2020-08-21 ENCOUNTER — Ambulatory Visit (INDEPENDENT_AMBULATORY_CARE_PROVIDER_SITE_OTHER): Payer: PPO | Admitting: Family Medicine

## 2020-08-21 ENCOUNTER — Other Ambulatory Visit: Payer: Self-pay

## 2020-08-21 VITALS — BP 124/66 | HR 59 | Temp 97.4°F | Resp 18 | Ht 61.0 in | Wt 124.0 lb

## 2020-08-21 DIAGNOSIS — I739 Peripheral vascular disease, unspecified: Secondary | ICD-10-CM | POA: Diagnosis not present

## 2020-08-21 DIAGNOSIS — K5904 Chronic idiopathic constipation: Secondary | ICD-10-CM

## 2020-08-21 DIAGNOSIS — F17219 Nicotine dependence, cigarettes, with unspecified nicotine-induced disorders: Secondary | ICD-10-CM

## 2020-08-21 DIAGNOSIS — I10 Essential (primary) hypertension: Secondary | ICD-10-CM | POA: Diagnosis not present

## 2020-08-21 DIAGNOSIS — E782 Mixed hyperlipidemia: Secondary | ICD-10-CM | POA: Diagnosis not present

## 2020-08-22 LAB — CBC WITH DIFFERENTIAL/PLATELET
Basophils Absolute: 0.1 10*3/uL (ref 0.0–0.2)
Basos: 1 %
EOS (ABSOLUTE): 0.2 10*3/uL (ref 0.0–0.4)
Eos: 2 %
Hematocrit: 39.2 % (ref 34.0–46.6)
Hemoglobin: 12.9 g/dL (ref 11.1–15.9)
Immature Grans (Abs): 0 10*3/uL (ref 0.0–0.1)
Immature Granulocytes: 0 %
Lymphocytes Absolute: 1.8 10*3/uL (ref 0.7–3.1)
Lymphs: 22 %
MCH: 30.1 pg (ref 26.6–33.0)
MCHC: 32.9 g/dL (ref 31.5–35.7)
MCV: 92 fL (ref 79–97)
Monocytes Absolute: 0.5 10*3/uL (ref 0.1–0.9)
Monocytes: 5 %
Neutrophils Absolute: 6 10*3/uL (ref 1.4–7.0)
Neutrophils: 70 %
Platelets: 234 10*3/uL (ref 150–450)
RBC: 4.28 x10E6/uL (ref 3.77–5.28)
RDW: 13.2 % (ref 11.7–15.4)
WBC: 8.5 10*3/uL (ref 3.4–10.8)

## 2020-08-22 LAB — LIPID PANEL
Chol/HDL Ratio: 3.4 ratio (ref 0.0–4.4)
Cholesterol, Total: 200 mg/dL — ABNORMAL HIGH (ref 100–199)
HDL: 58 mg/dL (ref >39)
LDL Chol Calc (NIH): 121 mg/dL — ABNORMAL HIGH (ref 0–99)
Triglycerides: 117 mg/dL (ref 0–149)
VLDL Cholesterol Cal: 21 mg/dL (ref 5–40)

## 2020-08-22 LAB — COMPREHENSIVE METABOLIC PANEL
ALT: 20 IU/L (ref 0–32)
AST: 23 IU/L (ref 0–40)
Albumin/Globulin Ratio: 1.8 (ref 1.2–2.2)
Albumin: 4.3 g/dL (ref 3.7–4.7)
Alkaline Phosphatase: 96 IU/L (ref 44–121)
BUN/Creatinine Ratio: 10 — ABNORMAL LOW (ref 12–28)
BUN: 11 mg/dL (ref 8–27)
Bilirubin Total: <0.2 mg/dL (ref 0.0–1.2)
CO2: 20 mmol/L (ref 20–29)
Calcium: 8.8 mg/dL (ref 8.7–10.3)
Chloride: 106 mmol/L (ref 96–106)
Creatinine, Ser: 1.12 mg/dL — ABNORMAL HIGH (ref 0.57–1.00)
Globulin, Total: 2.4 g/dL (ref 1.5–4.5)
Glucose: 85 mg/dL (ref 65–99)
Potassium: 4.4 mmol/L (ref 3.5–5.2)
Sodium: 143 mmol/L (ref 134–144)
Total Protein: 6.7 g/dL (ref 6.0–8.5)
eGFR: 52 mL/min/{1.73_m2} — ABNORMAL LOW (ref >59)

## 2020-08-22 LAB — CARDIOVASCULAR RISK ASSESSMENT

## 2020-08-23 ENCOUNTER — Other Ambulatory Visit: Payer: Self-pay | Admitting: Family Medicine

## 2020-08-23 ENCOUNTER — Other Ambulatory Visit: Payer: Self-pay | Admitting: Physician Assistant

## 2020-08-23 ENCOUNTER — Telehealth: Payer: Self-pay

## 2020-08-23 NOTE — Telephone Encounter (Signed)
Patient was notified of lab results.  She is taking Zetia daily and crestor 3 times weekly.  Dr. Sedalia Muta advised tat she watch her diet closely and follow-up as directed.

## 2020-08-24 ENCOUNTER — Other Ambulatory Visit: Payer: Self-pay | Admitting: *Deleted

## 2020-08-24 DIAGNOSIS — I739 Peripheral vascular disease, unspecified: Secondary | ICD-10-CM

## 2020-08-24 DIAGNOSIS — I779 Disorder of arteries and arterioles, unspecified: Secondary | ICD-10-CM

## 2020-09-11 ENCOUNTER — Ambulatory Visit: Payer: PPO | Admitting: Physician Assistant

## 2020-09-11 ENCOUNTER — Ambulatory Visit (INDEPENDENT_AMBULATORY_CARE_PROVIDER_SITE_OTHER)
Admission: RE | Admit: 2020-09-11 | Discharge: 2020-09-11 | Disposition: A | Discharge status: Home / Self Care | Payer: PPO | Source: Ambulatory Visit | Attending: Vascular Surgery | Admitting: Vascular Surgery

## 2020-09-11 ENCOUNTER — Other Ambulatory Visit: Payer: Self-pay

## 2020-09-11 ENCOUNTER — Ambulatory Visit (HOSPITAL_COMMUNITY)
Admission: RE | Admit: 2020-09-11 | Discharge: 2020-09-11 | Disposition: A | Discharge status: Home / Self Care | Payer: PPO | Source: Ambulatory Visit | Attending: Vascular Surgery | Admitting: Vascular Surgery

## 2020-09-11 VITALS — BP 137/75 | HR 49 | Temp 98.0°F | Resp 20 | Ht 61.0 in | Wt 124.1 lb

## 2020-09-11 DIAGNOSIS — I739 Peripheral vascular disease, unspecified: Secondary | ICD-10-CM | POA: Insufficient documentation

## 2020-09-11 DIAGNOSIS — I779 Disorder of arteries and arterioles, unspecified: Secondary | ICD-10-CM | POA: Insufficient documentation

## 2020-09-11 NOTE — Progress Notes (Signed)
Peripheral Arterial Disease Follow-Up   VASCULAR SURGERY ASSESSMENT & PLAN:   Carla Romero is a 75 y.o. female with a history of peripheral vascular disease and right lower extremity claudication.  She has had a 0.14 decrease in her right ABIs compared to 6 months ago.  Her toe pressures decreased from 82-63.  No change in left lower extremity arterial duplex exam.  Her claudication symptoms on the right are stable and not life limiting.  We discussed continued monitoring of her symptoms and to call us should she develop worsening pain, rest pain or nonhealing skin wounds.  Recommend resuming aspirin 81 mg daily.  Continue optimal medical management of  hypertension and follow-up with primary care physician. Encouraged complete smoking cessation. Continue the following medications: Aspirin, Plavix and Crestor. Follow-up in year with left lower extremity arterial duplex and ABIs.  SUBJECTIVE:   She is able to walk on flat surfaces without stopping.  Her right calf pain occurs mostly with uphill walking and will quickly resolve with rest.  She denies rest pain.  She states she only takes aspirin as needed for headache.  She is otherwise compliant with Plavix daily and states she takes rosuvastatin 3 times weekly and is intolerant of daily dosing due to joint pain.  She is status post percutaneous mechanical thrombectomy of the left superficial femoral/popliteal artery, atherectomy with drug-coated balloon angioplasty of the left SFA/popliteal artery and stent placement of left popliteal artery in 2018 by Dr. Myra Gianotti.  This was performed due to limiting claudication.  She has no left lower extremity complaints.   PHYSICAL EXAM:   Vitals:   09/11/20 1107  BP: 137/75  Pulse: (!) 49  Resp: 20  Temp: 98 F (36.7 C)  TempSrc: Temporal  SpO2: 100%  Weight: 124 lb 1.6 oz (56.3 kg)  Height: 5\' 1"  (1.549 m)    General appearance: Well-developed, well-nourished in no apparent  distress Neurologic: Alert and oriented x 4. Cardiovascular: Heart rate and rhythm are regular.   Abdomen: No palpable pulsatile mass. Extremities: Skin intact.  Both feet are warm and well perfused.  Motor function and sensation intact Pulse exam: 1+ bilateral femoral, 2+ left dorsalis pedis pulses. 2+ radial pulses bilaterally   NON-INVASIVE VASCULAR STUDIES  09/11/2020 ABIs ABI/TBIToday's ABIToday's TBIPrevious ABIPrevious TBI  +-------+-----------+-----------+------------+------------+  Right 0.62    0.50    0.76    0.49      +-------+-----------+-----------+------------+------------+  Left  0.99    0.75    1.02    0.66      +-------+-----------+-----------+------------+------------+   Right lower extremity waveforms are monophasic, right great toe pressure is 63 Extremity waveforms are biphasic left great toe pressure is 94.  LLE arterial duplex: Left: Patent stent with no evidence of stenosis in the mid superficial  femoral artery to popliteal artery. 50-74% stenosis noted in the distal  superficial femoral artery/proximal popliteal (distal to stent).   PROBLEM LIST:    The patient's past medical history, past surgical history, family history, social history, allergy list and medication list are reviewed. She is not diabetic  CURRENT MEDS:    Current Outpatient Medications:  .  alum & mag hydroxide-simeth (MAALOX/MYLANTA) 200-200-20 MG/5ML suspension, Take 30 mLs by mouth 2 (two) times daily as needed for indigestion or heartburn., Disp: , Rfl:  .  amLODipine (NORVASC) 10 MG tablet, TAKE 1 TABLET BY MOUTH ONCE DAILY., Disp: 90 tablet, Rfl: 0 .  Artificial Tear Solution (SOOTHE XP OP), Apply 1 drop to eye daily  as needed (dry eyes)., Disp: , Rfl:  .  aspirin EC 81 MG tablet, Take 81 mg by mouth daily., Disp: , Rfl:  .  B COMPLEX VITAMINS SL, Place 1 tablet under the tongue daily., Disp: , Rfl:  .  Calcium Citrate-Vitamin D  (CALCIUM + D PO), Take 1 tablet by mouth 3 (three) times a week., Disp: , Rfl:  .  cholecalciferol (VITAMIN D3) 25 MCG (1000 UNIT) tablet, Take 1,000 Units by mouth daily., Disp: , Rfl:  .  clopidogrel (PLAVIX) 75 MG tablet, TAKE 1 TABLET BY MOUTH ONCE DAILY., Disp: 30 tablet, Rfl: 10 .  diphenhydrAMINE (BENADRYL) 25 MG tablet, Take 25 mg by mouth at bedtime as needed for allergies., Disp: , Rfl:  .  ezetimibe (ZETIA) 10 MG tablet, TAKE 1 TABLET BY MOUTH AT BEDTIME, Disp: 90 tablet, Rfl: 3 .  ibuprofen (ADVIL,MOTRIN) 200 MG tablet, Take 400 mg by mouth 2 (two) times daily as needed for headache or moderate pain., Disp: , Rfl:  .  magnesium gluconate (MAGONATE) 500 MG tablet, Take 500 mg by mouth daily., Disp: , Rfl:  .  magnesium hydroxide (MILK OF MAGNESIA) 400 MG/5ML suspension, Take by mouth daily as needed for mild constipation., Disp: , Rfl:  .  rosuvastatin (CRESTOR) 5 MG tablet, TAKE 1 TABLET BY MOUTH 3 TIMES WEEKLY, Disp: 30 tablet, Rfl: 0 .  trolamine salicylate (ASPERCREME) 10 % cream, Apply 1 application topically as needed for muscle pain., Disp: , Rfl:  .  valsartan (DIOVAN) 320 MG tablet, TAKE 1 TABLET BY MOUTH ONCE DAILY, Disp: 90 tablet, Rfl: 0 .  vitamin C (ASCORBIC ACID) 500 MG tablet, Take 500 mg by mouth daily., Disp: , Rfl:  .  Lidocaine 4 % PTCH, Apply 1 patch topically daily as needed (pain). (Patient not taking: No sig reported), Disp: , Rfl:    REVIEW OF SYSTEMS:   [X]  denotes positive finding, [ ]  denotes negative finding Cardiac  Comments:  Chest pain or chest pressure:    Shortness of breath upon exertion:    Short of breath when lying flat:    Irregular heart rhythm:        Vascular    Pain in calf, thigh, or hip brought on by ambulation: x   Pain in feet at night that wakes you up from your sleep:     Blood clot in your veins:    Leg swelling:         Pulmonary    Oxygen at home:    Productive cough:     Wheezing:         Neurologic    Sudden weakness  in arms or legs:     Sudden numbness in arms or legs:     Sudden onset of difficulty speaking or slurred speech:    Temporary loss of vision in one eye:     Problems with dizziness:         Gastrointestinal    Blood in stool:     Vomited blood:         Genitourinary    Burning when urinating:     Blood in urine:        Psychiatric    Major depression:         Hematologic    Bleeding problems:    Problems with blood clotting too easily:        Skin    Rashes or ulcers:        Constitutional  Fever or chills:     Milinda Antis, PA-C  Office: [phone] 09/11/2020

## 2020-09-24 ENCOUNTER — Other Ambulatory Visit: Payer: Self-pay | Admitting: Family Medicine

## 2020-09-27 ENCOUNTER — Telehealth: Payer: Self-pay

## 2020-09-27 NOTE — Progress Notes (Signed)
Chronic Care Management Pharmacy Assistant   Name: NIKAELA COYNE  MRN: [id number] DOB: 1945-07-23  Carla Romero is an 75 y.o. year old female who presents for his follow-up CCM visit with the clinical pharmacist.  Reason for Encounter: Disease state call for HTN    Recent office visits:  08/21/2020: Dr. Sedalia Muta (PCP) / labs ordered / no medication changes noted  Recent consult visits:  09/11/2020: Wendi Maya, PA-C/ (Vascular Surgery) / no medication changes noted   Hospital visits:  None noted since last CCM visit   Medications: Outpatient Encounter Medications as of 09/27/2020  Medication Sig Note   alum & mag hydroxide-simeth (MAALOX/MYLANTA) 200-200-20 MG/5ML suspension Take 30 mLs by mouth 2 (two) times daily as needed for indigestion or heartburn.    amLODipine (NORVASC) 10 MG tablet TAKE 1 TABLET BY MOUTH ONCE DAILY.    Artificial Tear Solution (SOOTHE XP OP) Apply 1 drop to eye daily as needed (dry eyes).    aspirin EC 81 MG tablet Take 81 mg by mouth daily.    B COMPLEX VITAMINS SL Place 1 tablet under the tongue daily.    Calcium Citrate-Vitamin D (CALCIUM + D PO) Take 1 tablet by mouth 3 (three) times a week. 06/19/2016: Pt puts this medication on hold if she has any stomach problems   cholecalciferol (VITAMIN D3) 25 MCG (1000 UNIT) tablet Take 1,000 Units by mouth daily.    clopidogrel (PLAVIX) 75 MG tablet TAKE 1 TABLET BY MOUTH ONCE DAILY.    diphenhydrAMINE (BENADRYL) 25 MG tablet Take 25 mg by mouth at bedtime as needed for allergies.    ezetimibe (ZETIA) 10 MG tablet TAKE 1 TABLET BY MOUTH AT BEDTIME    ibuprofen (ADVIL,MOTRIN) 200 MG tablet Take 400 mg by mouth 2 (two) times daily as needed for headache or moderate pain.    Lidocaine 4 % PTCH Apply 1 patch topically daily as needed (pain). (Patient not taking: No sig reported)    magnesium gluconate (MAGONATE) 500 MG tablet Take 500 mg by mouth daily.    magnesium hydroxide (MILK OF MAGNESIA) 400 MG/5ML  suspension Take by mouth daily as needed for mild constipation.    rosuvastatin (CRESTOR) 5 MG tablet TAKE 1 TABLET BY MOUTH 3 TIMES WEEKLY    trolamine salicylate (ASPERCREME) 10 % cream Apply 1 application topically as needed for muscle pain.    valsartan (DIOVAN) 320 MG tablet TAKE 1 TABLET BY MOUTH ONCE DAILY    vitamin C (ASCORBIC ACID) 500 MG tablet Take 500 mg by mouth daily.    No facility-administered encounter medications on file as of 09/27/2020.    Recent Office Vitals: BP Readings from Last 3 Encounters:  09/11/20 137/75  08/21/20 124/66  03/21/20 (!) 160/64   Pulse Readings from Last 3 Encounters:  09/11/20 (!) 49  08/21/20 (!) 59  03/21/20 72    Wt Readings from Last 3 Encounters:  09/11/20 124 lb 1.6 oz (56.3 kg)  08/21/20 124 lb (56.2 kg)  03/21/20 121 lb 9.6 oz (55.2 kg)     Kidney Function Lab Results  Component Value Date/Time   CREATININE 1.12 (H) 08/21/2020 07:55 AM   CREATININE 1.08 (H) 03/21/2020 08:16 AM   CREATININE 1.00 01/30/2014 09:45 AM   GFRNONAA 51 (L) 03/21/2020 08:16 AM   GFRAA 59 (L) 03/21/2020 08:16 AM    BMP Latest Ref Rng & Units 08/21/2020 03/21/2020 12/13/2019  Glucose 65 - 99 mg/dL 85 82 82  BUN 8 - 27  mg/dL 11 9 9   Creatinine 0.57 - 1.00 mg/dL ) 3.84(Y) 6.59(D  BUN/Creat Ratio 12 - 28 10(L) 8(L) 10(L)  Sodium 134 - 144 mmol/L 143 142 140  Potassium 3.5 - 5.2 mmol/L 4.4 4.5 4.3  Chloride 96 - 106 mmol/L 106 106 103  CO2 20 - 29 mmol/L 20 20 23   Calcium 8.7 - 10.3 mg/dL 8.8 9.5 9.2     Current antihypertensive regimen:   amolodipine 10mg . once daily valsartan 320 mg. once daily    Patient verbally confirms she is taking the above medications as directed.     How often are you checking your Blood Pressure? Patient stated that she used to check it every day longer checks bp unless she has a headache and she will take it then.     she checks her blood pressure in the morning before taking her medication.   Current  home BP readings: 127/73 , 121/64, 137/66   OTC medications including pseudoephedrine or NSAIDs? Aspirin 81 mg. Daily  Any readings above 180/120? No    What recent interventions/DTPs have been made by any provider to improve Blood Pressure control since last CPP Visit:  No   Any recent hospitalizations or ED visits since last visit with CPP? No   What diet changes have been made to improve Blood Pressure Control? Patient states she drinks 2-3 pepsi's a day and 2-3 glasses of tea. She stated she tries not to add salt, but she only adds salt to tomatoes, and raw vegetables like that. She eats a lot of fresh stir fry vegetables nad has to add a little salt. She eats grapes and an apple every day. She also reported that she does not eat a lot of fried food.    What exercise is being done to improve your Blood Pressure Control?  She states she does warm-up exercises that she learned to do at the [facility], she goes line dancing once a week, and leg exercises most every day. She also reported that she does a good amount of walking.     Adherence Review: Is the patient currently on ACE/ARB medication? yes  Does the patient have >5 day gap between last estimated fill dates? No, CPP to review  Care Gaps: Last annual wellness visit? Last visit documentation not found/  Scheduled for 10/11/2020 @ 9am    Star Rating Drugs:  Medication:  Last Fill: Day Supply Valsartan 320mg . 07/02/2020 90DS Rosuvastatin 5mg   09/07/2020 90DS     10/13/2020, CMA  [phone] Clinical Pharmacist Assistant

## 2020-10-01 ENCOUNTER — Other Ambulatory Visit: Payer: Self-pay | Admitting: Family Medicine

## 2020-10-11 ENCOUNTER — Ambulatory Visit (INDEPENDENT_AMBULATORY_CARE_PROVIDER_SITE_OTHER): Payer: PPO

## 2020-10-11 ENCOUNTER — Other Ambulatory Visit: Payer: Self-pay

## 2020-10-11 VITALS — BP 132/80 | HR 72 | Resp 16 | Ht 60.0 in | Wt 122.8 lb

## 2020-10-11 DIAGNOSIS — M810 Age-related osteoporosis without current pathological fracture: Secondary | ICD-10-CM

## 2020-10-11 DIAGNOSIS — Z5320 Procedure and treatment not carried out because of patient's decision for unspecified reasons: Secondary | ICD-10-CM

## 2020-10-11 DIAGNOSIS — Z0000 Encounter for general adult medical examination without abnormal findings: Secondary | ICD-10-CM

## 2020-10-11 DIAGNOSIS — Z6823 Body mass index (BMI) 23.0-23.9, adult: Secondary | ICD-10-CM

## 2020-10-11 DIAGNOSIS — F17219 Nicotine dependence, cigarettes, with unspecified nicotine-induced disorders: Secondary | ICD-10-CM

## 2020-10-11 NOTE — Progress Notes (Signed)
Subjective:   Carla CraftsKathryn G Romero is a 75 y.o. female who presents for Medicare Annual (Subsequent) preventive examination.  This wellness visit is conducted by a nurse.  The patient's medications were reviewed and reconciled since the patient's last visit.  History details were provided by the patient.  The history appears to be reliable.    Patient's last AWV was one year ago.   Medical History: Patient history and Family history was reviewed  Medications, Allergies, and preventative health maintenance was reviewed and updated.   Review of Systems    Review of Systems  Constitutional: Negative.   HENT: Negative.    Eyes: Negative.   Respiratory: Negative.  Negative for cough, chest tightness and shortness of breath.   Cardiovascular: Negative.  Negative for chest pain and palpitations.  Gastrointestinal: Negative.   Genitourinary: Negative.   Musculoskeletal: Negative.   Neurological: Negative.  Negative for dizziness, light-headedness and numbness.  Psychiatric/Behavioral: Negative.  Negative for behavioral problems, dysphoric mood and suicidal ideas. The patient is not nervous/anxious.    Cardiac Risk Factors include: advanced age (>1055men, 38>65 women);dyslipidemia;hypertension;smoking/ tobacco exposure     Objective:    Today's Vitals   10/11/20 0911  BP: 132/80  Pulse: 72  Resp: 16  SpO2: 97%  Weight: 122 lb 12.8 oz (55.7 kg)  Height: 5' (1.524 m)  PainSc: 0-No pain   Body mass index is 23.98 kg/m.  Advanced Directives 10/11/2020 10/12/2019 01/27/2017 10/27/2016 07/28/2016 06/24/2016 06/16/2016  Does Patient Have a Medical Advance Directive? Yes Yes Yes Yes - Yes Yes  Type of Advance Directive Healthcare Power of [place];Living will Healthcare Power of [place] Healthcare Power of [place];Living will Healthcare Power of [place];Living will Healthcare Power of [place];Living will Healthcare Power of [place]orney Healthcare Power of [place];Living will  Does patient want to make  changes to medical advance directive? No - Patient declined - - - - No - Patient declined -  Copy of Healthcare Power of Attorney in Chart? No - copy requested - - - No - copy requested Yes -  Would patient like information on creating a medical advance directive? - - - - - - -    Current Medications (verified) Outpatient Encounter Medications as of 10/11/2020  Medication Sig   alum & mag hydroxide-simeth (MAALOX/MYLANTA) 200-200-20 MG/5ML suspension Take 30 mLs by mouth 2 (two) times daily as needed for indigestion or heartburn.   amLODipine (NORVASC) 10 MG tablet TAKE 1 TABLET BY MOUTH ONCE DAILY.   Artificial Tear Solution (SOOTHE XP OP) Apply 1 drop to eye daily as needed (dry eyes).   aspirin EC 81 MG tablet Take 81 mg by mouth daily.   B COMPLEX VITAMINS SL Place 1 tablet under the tongue daily.   Calcium Citrate-Vitamin D (CALCIUM + D PO) Take 1 tablet by mouth 3 (three) times a week.   cholecalciferol (VITAMIN D3) 25 MCG (1000 UNIT) tablet Take 1,000 Units by mouth daily.   clopidogrel (PLAVIX) 75 MG tablet TAKE 1 TABLET BY MOUTH ONCE DAILY.   diphenhydrAMINE (BENADRYL) 25 MG tablet Take 25 mg by mouth at bedtime as needed for allergies.   ezetimibe (ZETIA) 10 MG tablet TAKE 1 TABLET BY MOUTH AT BEDTIME   ibuprofen (ADVIL,MOTRIN) 200 MG tablet Take 400 mg by mouth 2 (two) times daily as needed for headache or moderate pain.   Lidocaine 4 % PTCH Apply 1 patch topically daily as needed (pain).   magnesium gluconate (MAGONATE) 500 MG tablet Take 500 mg by mouth daily.  magnesium hydroxide (MILK OF MAGNESIA) 400 MG/5ML suspension Take by mouth daily as needed for mild constipation.   rosuvastatin (CRESTOR) 5 MG tablet TAKE 1 TABLET BY MOUTH 3 TIMES WEEKLY   trolamine salicylate (ASPERCREME) 10 % cream Apply 1 application topically as needed for muscle pain.   valsartan (DIOVAN) 320 MG tablet TAKE 1 TABLET BY MOUTH ONCE DAILY   vitamin C (ASCORBIC ACID) 500 MG tablet Take 500 mg by mouth  daily.   No facility-administered encounter medications on file as of 10/11/2020.    Allergies (verified) Ace inhibitors, Trilipix [choline fenofibrate], and Zocor [simvastatin]   History: Past Medical History:  Diagnosis Date   Acute viral syndrome    Anemia    Vit D deficiency   Arteritis, unspecified (HCC) November 01, 2013   Arthritis    "fingers, knees, upper spine" (06/24/2016)   B12 deficiency anemia    "had injections in the 1970s"   CAD (coronary artery disease)    COPD (chronic obstructive pulmonary disease) (HCC)    "no one has ever told me I have this" (06/24/2016)   Diverticulitis    GERD (gastroesophageal reflux disease)    Hyperlipidemia    Hypertension    Osteoporosis    Pain October 28, 2013   Left Lower quadrant / back   Thrombocytopenia [facility])    Past Surgical History:  Procedure Laterality Date   ABDOMINAL AORTOGRAM W/LOWER EXTREMITY N/A 06/24/2016   Procedure: Abdominal Aortogram w/Lower Extremity;  Surgeon: Nada Libman, MD;  Location: [facility];  Service: Cardiovascular;  Laterality: N/A;   ABDOMINAL HYSTERECTOMY     APPENDECTOMY     COLONOSCOPY  Aug. 4, 2014   FEMORAL ARTERY STENT Left 2018   [employer] 6 mm x 80 mm x 130 cm. Dr Myra Gianotti [phone]   TUBAL LIGATION     Family History  Problem Relation Age of Onset   Hypertension Mother    Hyperlipidemia Mother    Hypertension Sister    Social History   Socioeconomic History   Marital status: Widowed   Number of children: 2  Tobacco Use   Smoking status: Every Day    Packs/day: 0.12    Years: 43.00    Pack years: 5.16    Types: Cigarettes   Smokeless tobacco: Never   Tobacco comments:    5-7 cigarettes per day  Vaping Use   Vaping Use: Never used  Substance and Sexual Activity   Alcohol use: Yes    Alcohol/week: 2.0 - 3.0 standard drinks    Types: 2 - 3 Standard drinks or equivalent per week    Comment: 06/24/2016 "might have a few drinks/year"   Drug use: No   Sexual  activity: Not Currently    Birth control/protection: Post-menopausal   Social Determinants of Health   Financial Resource Strain: Not on file  Food Insecurity: No Food Insecurity   Worried About Programme researcher, broadcasting/film/video in the Last Year: Never true   Ran Out of Food in the Last Year: Never true  Transportation Needs: No Transportation Needs   Lack of Transportation (Medical): No   Lack of Transportation (Non-Medical): No  Physical Activity: Not on file  Stress: Not on file  Social Connections: Not on file   Tobacco Counseling Ready to quit: No Counseling given: Yes Tobacco comments: 5-7 cigarettes per day  Clinical Intake:  Pre-visit preparation completed: Yes  Pain : No/denies pain Pain Score: 0-No pain   BMI - recorded: 23.98 Nutritional Status:  BMI of 19-24  Normal Nutritional Risks: None Diabetes: No  How often do you need to have someone help you when you read instructions, pamphlets, or other written materials from your doctor or pharmacy?: 2 - Rarely Interpreter Needed?: No   Activities of Daily Living In your present state of health, do you have any difficulty performing the following activities: 10/11/2020  Hearing? N  Vision? N  Difficulty concentrating or making decisions? Y  Walking or climbing stairs? N  Dressing or bathing? N  Doing errands, shopping? N  Preparing Food and eating ? N  Using the Toilet? N  In the past six months, have you accidently leaked urine? N  Do you have problems with loss of bowel control? N  Managing your Medications? N  Managing your Finances? N  Housekeeping or managing your Housekeeping? N  Some recent data might be hidden    Patient Care Team: Blane Ohara, MD as PCP - General (Family Medicine) Valetta Fuller, Lynnell Jude, PA-C as Physician Assistant (Physician Assistant) Earvin Hansen, [facility] as Pharmacist (Pharmacist)    Assessment:   This is a routine wellness examination for Carla Romero.  Dietary issues and exercise activities  discussed: Current Exercise Habits: Home exercise routine, Type of exercise: walking, Time (Minutes): 20, Frequency (Times/Week): 6, Weekly Exercise (Minutes/Week): 120, Intensity: Mild, Exercise limited by: None identified.  Olamide eats a variety of fruits and vegetables daily.   Depression Screen PHQ 2/9 Scores 10/11/2020 08/21/2020 10/11/2019  PHQ - 2 Score 0 0 0  PHQ- 9 Score - 1 -    Fall Risk Fall Risk  10/11/2020 08/21/2020 08/21/2020 10/12/2019 10/11/2019  Falls in the past year? 0 - 0 - 0  Number falls in past yr: 0 0 0 - 0  Injury with Fall? 0 - 0 - -  Risk for fall due to : No Fall Risks No Fall Risks - - -  Follow up Falls evaluation completed;Falls prevention discussed;Education provided Falls evaluation completed - Falls evaluation completed;Falls prevention discussed -    FALL RISK PREVENTION PERTAINING TO THE HOME: Home free of loose throw rugs in walkways, pet beds, electrical cords, etc? Yes  Adequate lighting in your home to reduce risk of falls? Yes   ASSISTIVE DEVICES UTILIZED TO PREVENT FALLS:  Life alert? No  Use of a cane, walker or w/c? No  Gait steady and fast without use of assistive device  Cognitive Function:     6CIT Screen 10/11/2020  What Year? 0 points  What month? 0 points  What time? 0 points  Count back from 20 2 points  Months in reverse 4 points  Repeat phrase 4 points  Total Score 10    Immunizations Immunization History  Administered Date(s) Administered   Moderna SARS-COV2 Booster Vaccination 03/20/2020   Moderna Sars-Covid-2 Vaccination 06/03/2019, 07/01/2019   Pneumococcal Conjugate-13 03/28/2015   Pneumococcal Polysaccharide-23 03/09/2011, 03/30/2018   Tdap 04/21/2009   Zoster, Live 04/22/2007    TDAP status: Due, Education has been provided regarding the importance of this vaccine. Advised may receive this vaccine at local pharmacy or Health Dept. Aware to provide a copy of the vaccination record if obtained from local pharmacy or  Health Dept. Verbalized acceptance and understanding.  Flu Vaccine status: Up to date  Pneumococcal vaccine status: Up to date  Covid-19 vaccine status: Information provided on how to obtain vaccines.   Qualifies for Shingles Vaccine? Yes   Zostavax completed No   Shingrix Completed?: No.    Education has been  provided regarding the importance of this vaccine. Patient has been advised to call insurance company to determine out of pocket expense if they have not yet received this vaccine. Advised may also receive vaccine at local pharmacy or Health Dept. Verbalized acceptance and understanding.  Screening Tests Health Maintenance  Topic Date Due   Hepatitis C Screening  Never done   Zoster Vaccines- Shingrix (1 of 2) Never done   TETANUS/TDAP  04/22/2019   COVID-19 Vaccine (4 - Booster for Moderna series) 07/18/2020   INFLUENZA VACCINE  11/19/2020   COLONOSCOPY (Pts 45-43yrs Insurance coverage will need to be confirmed)  11/23/2022   DEXA SCAN  Completed   PNA vac Low Risk Adult  Completed   HPV VACCINES  Aged Out   MAMMOGRAM  Discontinued    Health Maintenance  Health Maintenance Due  Topic Date Due   Hepatitis C Screening  Never done   Zoster Vaccines- Shingrix (1 of 2) Never done   TETANUS/TDAP  04/22/2019   COVID-19 Vaccine (4 - Booster for Moderna series) 07/18/2020    Colorectal cancer screening: Type of screening: Colonoscopy. Completed 2014. Patient declined further follow-ups  Mammogram status: Completed 2017. Recommend repeat every year, patient declined further screenings.  Bone Density status: Completed 2014. Results reflect: Bone density results: OSTEOPOROSIS. Repeat every 2 years. Patient declined further screenings.  Additional Screening:  Vision Screening: Recommended annual ophthalmology exams for early detection of glaucoma and other disorders of the eye. Is the patient up to date with their annual eye exam?  Yes   Dental Screening: Recommended annual  dental exams for proper oral hygiene    Plan:    1- Advance Directive - copy requested for our records 2- Mammogram - patient declined future screenings 3- DEXA - positive for osteoporosis, patient declined further screenings.  She takes Calcium+D "sometimes"  declines other medication. 4- Nicotine Dependence - educated on health risks of smoking, patient not interested in quitting 5- Vaccines: COVID booster due, patient doesn't want to get it today.  Tetanus and Shringrix due - patient will get at pharmacy 6- Continue exercise and healthy diet including fresh fruits and vegetables.   I have personally reviewed and noted the following in the patient's chart:   Medical and social history Use of alcohol, tobacco or illicit drugs  Current medications and supplements including opioid prescriptions.  Functional ability and status Nutritional status Physical activity Advanced directives List of other physicians Hospitalizations, surgeries, and ER visits in previous 12 months Vitals Screenings to include cognitive, depression, and falls Referrals and appointments  In addition, I have reviewed and discussed with patient certain preventive protocols, quality metrics, and best practice recommendations. A written personalized care plan for preventive services as well as general preventive health recommendations were provided to patient.     Jacklynn Bue, LPN   [id number]/45/8099

## 2020-10-11 NOTE — Patient Instructions (Signed)
Bone Density Test A bone density test uses a type of X-ray to measure the amount of calcium and other minerals in a person's bones. It can measure bone density in the hip and the spine. The test is similar to having a regular X-ray. This test may also be called: Bone densitometry. Bone mineral density test. Dual-energy X-ray absorptiometry (DEXA). You may have this test to: Diagnose a condition that causes weak or thin bones (osteoporosis). Screen you for osteoporosis. Predict your risk for a broken bone (fracture). Determine how well your osteoporosis treatment is working. Tell a health care provider about: Any allergies you have. All medicines you are taking, including vitamins, herbs, eye drops, creams, and over-the-counter medicines. Any problems you or family members have had with anesthetic medicines. Any blood disorders you have. Any surgeries you have had. Any medical conditions you have. Whether you are pregnant or may be pregnant. Any medical tests you have had within the past 14 days that used contrast material. What are the risks? Generally, this is a safe test. However, it does expose you to a small amountof radiation, which can slightly increase your cancer risk. What happens before the test? Do not take any calcium supplements within the 24 hours before your test. You will need to remove all metal jewelry, eyeglasses, removable dental appliances, and any other metal objects on your body. What happens during the test?  You will lie down on an exam table. There will be an X-ray generator below you and an imaging device above you. Other devices, such as boxes or braces, may be used to position your body properly for the scan. The machine will slowly scan your body. You will need to keep very still while the machine does the scan. The images will show up on a screen in the room. Images will be examined by a specialist after your test is finished. The procedure may vary among  health care providers and hospitals. What can I expect after the test? It is up to you to get the results of your test. Ask your health care provider,or the department that is doing the test, when your results will be ready. Summary A bone density test is an imaging test that uses a type of X-ray to measure the amount of calcium and other minerals in your bones. The test may be used to diagnose or screen you for a condition that causes weak or thin bones (osteoporosis), predict your risk for a broken bone (fracture), or determine how well your osteoporosis treatment is working. Do not take any calcium supplements within 24 hours before your test. Ask your health care provider, or the department that is doing the test, when your results will be ready. This information is not intended to replace advice given to you by your health care provider. Make sure you discuss any questions you have with your healthcare provider. Document Revised: 09/22/2019 Document Reviewed: 09/22/2019 Elsevier Patient Education  2022 [employer] Prevention in the Home, Adult Falls can cause injuries and can happen to people of all ages. There are many things you can do to make your home safe and to help prevent falls. Ask forhelp when making these changes. What actions can I take to prevent falls? General Instructions Use good lighting in all rooms. Replace any light bulbs that burn out. Turn on the lights in dark areas. Use night-lights. Keep items that you use often in easy-to-reach places. Lower the shelves around your home if needed. Set  up your furniture so you have a clear path. Avoid moving your furniture around. Do not have throw rugs or other things on the floor that can make you trip. Avoid walking on wet floors. If any of your floors are uneven, fix them. Add color or contrast paint or tape to clearly mark and help you see: Grab bars or handrails. First and last steps of staircases. Where the edge  of each step is. If you use a stepladder: Make sure that it is fully opened. Do not climb a closed stepladder. Make sure the sides of the stepladder are locked in place. Ask someone to hold the stepladder while you use it. Know where your pets are when moving through your home. What can I do in the bathroom?     Keep the floor dry. Clean up any water on the floor right away. Remove soap buildup in the tub or shower. Use nonskid mats or decals on the floor of the tub or shower. Attach bath mats securely with double-sided, nonslip rug tape. If you need to sit down in the shower, use a plastic, nonslip stool. Install grab bars by the toilet and in the tub and shower. Do not use towel bars as grab bars. What can I do in the bedroom? Make sure that you have a light by your bed that is easy to reach. Do not use any sheets or blankets for your bed that hang to the floor. Have a firm chair with side arms that you can use for support when you get dressed. What can I do in the kitchen? Clean up any spills right away. If you need to reach something above you, use a step stool with a grab bar. Keep electrical cords out of the way. Do not use floor polish or wax that makes floors slippery. What can I do with my stairs? Do not leave any items on the stairs. Make sure that you have a light switch at the top and the bottom of the stairs. Make sure that there are handrails on both sides of the stairs. Fix handrails that are broken or loose. Install nonslip stair treads on all your stairs. Avoid having throw rugs at the top or bottom of the stairs. Choose a carpet that does not hide the edge of the steps on the stairs. Check carpeting to make sure that it is firmly attached to the stairs. Fix carpet that is loose or worn. What can I do on the outside of my home? Use bright outdoor lighting. Fix the edges of walkways and driveways and fix any cracks. Remove anything that might make you trip as you  walk through a door, such as a raised step or threshold. Trim any bushes or trees on paths to your home. Check to see if handrails are loose or broken and that both sides of all steps have handrails. Install guardrails along the edges of any raised decks and porches. Clear paths of anything that can make you trip, such as tools or rocks. Have leaves, snow, or ice cleared regularly. Use sand or salt on paths during winter. Clean up any spills in your garage right away. This includes grease or oil spills. What other actions can I take? Wear shoes that: Have a low heel. Do not wear high heels. Have rubber bottoms. Feel good on your feet and fit well. Are closed at the toe. Do not wear open-toe sandals. Use tools that help you move around if needed. These include: Canes.  Walkers. Scooters. Crutches. Review your medicines with your doctor. Some medicines can make you feel dizzy. This can increase your chance of falling. Ask your doctor what else you can do to help prevent falls. Where to find more information Centers for Disease Control and Prevention, [employer]: http://www.wolf.info/ National Institute on Aging: http://kim-miller.com/ Contact a doctor if: You are afraid of falling at home. You feel weak, drowsy, or dizzy at home. You fall at home. Summary There are many simple things that you can do to make your home safe and to help prevent falls. Ways to make your home safe include removing things that can make you trip and installing grab bars in the bathroom. Ask for help when making these changes in your home. This information is not intended to replace advice given to you by your health care provider. Make sure you discuss any questions you have with your healthcare provider. Document Revised: 11/09/2019 Document Reviewed: 11/09/2019 Elsevier Patient Education  [employer] Maintenance, Female Adopting a healthy lifestyle and getting preventive care are important in promoting health  and wellness. Ask your health care provider about: The right schedule for you to have regular tests and exams. Things you can do on your own to prevent diseases and keep yourself healthy. What should I know about diet, weight, and exercise? Eat a healthy diet  Eat a diet that includes plenty of vegetables, fruits, low-fat dairy products, and lean protein. Do not eat a lot of foods that are high in solid fats, added sugars, or sodium.  Maintain a healthy weight Body mass index (BMI) is used to identify weight problems. It estimates body fat based on height and weight. Your health care provider can help determineyour BMI and help you achieve or maintain a healthy weight. Get regular exercise Get regular exercise. This is one of the most important things you can do for your health. Most adults should: Exercise for at least 150 minutes each week. The exercise should increase your heart rate and make you sweat (moderate-intensity exercise). Do strengthening exercises at least twice a week. This is in addition to the moderate-intensity exercise. Spend less time sitting. Even light physical activity can be beneficial. Watch cholesterol and blood lipids Have your blood tested for lipids and cholesterol at 74 years of age, then havethis test every 5 years. Have your cholesterol levels checked more often if: Your lipid or cholesterol levels are high. You are older than 74 years of age. You are at high risk for heart disease. What should I know about cancer screening? Depending on your health history and family history, you may need to have cancer screening at various ages. This may include screening for: Breast cancer. Cervical cancer. Colorectal cancer. Skin cancer. Lung cancer. What should I know about heart disease, diabetes, and high blood pressure? Blood pressure and heart disease High blood pressure causes heart disease and increases the risk of stroke. This is more likely to develop in  people who have high blood pressure readings, are of African descent, or are overweight. Have your blood pressure checked: Every 3-5 years if you are 21-41 years of age. Every year if you are 29 years old or older. Diabetes Have regular diabetes screenings. This checks your fasting blood sugar level. Have the screening done: Once every three years after age 70 if you are at a normal weight and have a low risk for diabetes. More often and at a younger age if you are overweight or have a high  risk for diabetes. What should I know about preventing infection? Hepatitis B If you have a higher risk for hepatitis B, you should be screened for this virus. Talk with your health care provider to find out if you are at risk forhepatitis B infection. Hepatitis C Testing is recommended for: Everyone born from [birth year] through 1964. Anyone with known risk factors for hepatitis C. Sexually transmitted infections (STIs) Get screened for STIs, including gonorrhea and chlamydia, if: You are sexually active and are younger than 74 years of age. You are older than 74 years of age and your health care provider tells you that you are at risk for this type of infection. Your sexual activity has changed since you were last screened, and you are at increased risk for chlamydia or gonorrhea. Ask your health care provider if you are at risk. Ask your health care provider about whether you are at high risk for HIV. Your health care provider may recommend a prescription medicine to help prevent HIV infection. If you choose to take medicine to prevent HIV, you should first get tested for HIV. You should then be tested every 3 months for as long as you are taking the medicine. Pregnancy If you are about to stop having your period (premenopausal) and you may become pregnant, seek counseling before you get pregnant. Take 400 to 800 micrograms (mcg) of folic acid every day if you become pregnant. Ask for birth control  (contraception) if you want to prevent pregnancy. Osteoporosis and menopause Osteoporosis is a disease in which the bones lose minerals and strength with aging. This can result in bone fractures. If you are 80 years old or older, or if you are at risk for osteoporosis and fractures, ask your health care provider if you should: Be screened for bone loss. Take a calcium or vitamin D supplement to lower your risk of fractures. Be given hormone replacement therapy (HRT) to treat symptoms of menopause. Follow these instructions at home: Lifestyle Do not use any products that contain nicotine or tobacco, such as cigarettes, e-cigarettes, and chewing tobacco. If you need help quitting, ask your health care provider. Do not use street drugs. Do not share needles. Ask your health care provider for help if you need support or information about quitting drugs. Alcohol use Do not drink alcohol if: Your health care provider tells you not to drink. You are pregnant, may be pregnant, or are planning to become pregnant. If you drink alcohol: Limit how much you use to 0-1 drink a day. Limit intake if you are breastfeeding. Be aware of how much alcohol is in your drink. In the [place] one drink equals one 12 oz bottle of beer (355 mL), one 5 oz glass of wine (148 mL), or one 1 oz glass of hard liquor (44 mL). General instructions Schedule regular health, dental, and eye exams. Stay current with your vaccines. Tell your health care provider if: You often feel depressed. You have ever been abused or do not feel safe at home. Summary Adopting a healthy lifestyle and getting preventive care are important in promoting health and wellness. Follow your health care provider's instructions about healthy diet, exercising, and getting tested or screened for diseases. Follow your health care provider's instructions on monitoring your cholesterol and blood pressure. This information is not intended to replace advice  given to you by your health care provider. Make sure you discuss any questions you have with your healthcare provider. Document Revised: 03/31/2018 Document Reviewed: 03/31/2018 Elsevier Patient  Education  2022 [place] A mammogram is an X-ray of the breasts. This procedure can screen for and detect any changes that may indicate breast cancer. Mammograms are regularly done beginning at age 48 for women with average risk. A man may have amammogram if he has a lump or swelling in his breast tissue. A mammogram can also identify other changes and variations in the breast, such as: Inflammation of the breast tissue (mastitis). An infected area that contains a collection of pus (abscess). A fluid-filled sac (cyst). Tumors that are not cancerous (benign). Fibrocystic changes. This is when breast tissue becomes denser and can make the tissue feel rope-like or uneven under the skin. Women at higher risk for breast cancer need earlier and more comprehensive screening for abnormal changes. Breast tomosynthesis, or three-dimensional (3D) mammography, and digital breast tomosynthesis are advanced forms of imagingthat create 3D pictures of the breasts. Tell a health care provider: About any allergies you have. If you have breast implants. If you have had previous breast disease, biopsy, or surgery. If you have a family history of breast cancer. If you are breastfeeding. Whether you are pregnant or may be pregnant. What are the risks? Generally, this is a safe procedure. However, problems may occur, including: Exposure to radiation. Radiation levels are very low with this test. The need for more tests. The mammogram fails to detect certain cancers or the results are misinterpreted. Difficulty with detecting breast cancer in women with dense breasts. What happens before the procedure? Schedule your test about 1-2 weeks after your menstrual period if you are menstruating. This is usually  when your breasts are the least tender. If you have had a mammogram done at a different facility in the past, get the mammogram X-rays or have them sent to your current exam facility. The new and old images will be compared. Wash your breasts and underarms on the day of the test. Do not wear deodorants, perfumes, lotions, or powders anywhere on your body on the day of the test. Remove any jewelry from your neck. Wear clothes that you can change into and out of easily. What happens during the procedure?  You will undress from the waist up and put on a gown that opens in the front. You will stand in front of the X-ray machine. Each breast will be placed between two plastic or glass plates. The plates will compress your breast for a few seconds. Try to stay as relaxed as possible. This procedure does not cause any harm to your breasts. Any discomfort you feel will be very brief. X-rays will be taken from different angles of each breast. The procedure may vary among health care providers and hospitals. What can I expect after the procedure? The mammogram will be examined by a specialist (radiologist). You may need to repeat certain parts of the test, depending on the quality of the images. This is done if the radiologist needs a better view of the breast tissue. You may resume your normal activities. It is up to you to get the results of your procedure. Ask your health care provider, or the department that is doing the procedure, when your results will be ready. Summary A mammogram is an X-ray of the breasts. This procedure can screen for and detect any changes that may indicate breast cancer. A man may have a mammogram if he has a lump or swelling in his breast tissue. If you have had a mammogram done at a different facility in  the past, get the mammogram X-rays or have them sent to your current exam facility in order to compare them. Schedule your test about 1-2 weeks after your menstrual period  if you are menstruating. Ask when your test results will be ready. Make sure you get your test results. This information is not intended to replace advice given to you by your health care provider. Make sure you discuss any questions you have with your healthcare provider. Document Revised: 02/06/2020 Document Reviewed: 02/06/2020 Elsevier Patient Education  2022 [employer]  Steps to Quit Smoking Smoking tobacco is the leading cause of preventable death. It can affect almost every organ in the body. Smoking puts you and people around you at risk for many serious, long-lasting (chronic) diseases. Quitting smoking can be hard, but it is one of the best things thatyou can do for your health. It is never too late to quit. How do I get ready to quit? When you decide to quit smoking, make a plan to help you succeed. Before you quit: Pick a date to quit. Set a date within the next 2 weeks to give you time to prepare. Write down the reasons why you are quitting. Keep this list in places where you will see it often. Tell your family, friends, and co-workers that you are quitting. Their support is important. Talk with your doctor about the choices that may help you quit. Find out if your health insurance will pay for these treatments. Know the people, places, things, and activities that make you want to smoke (triggers). Avoid them. What first steps can I take to quit smoking? Throw away all cigarettes at home, at work, and in your car. Throw away the things that you use when you smoke, such as ashtrays and lighters. Clean your car. Make sure to empty the ashtray. Clean your home, including curtains and carpets. What can I do to help me quit smoking? Talk with your doctor about taking medicines and seeing a counselor at the same time. You are more likely to succeed when you do both. If you are pregnant or breastfeeding, talk with your doctor about counseling or other ways to quit smoking. Do not  take medicine to help you quit smoking unless your doctor tells you to do so. To quit smoking: Quit right away Quit smoking totally, instead of slowly cutting back on how much you smoke over a period of time. Go to counseling. You are more likely to quit if you go to counseling sessions regularly. Take medicine You may take medicines to help you quit. Some medicines need a prescription, and some you can buy over-the-counter. Some medicines may contain a drug called nicotine to replace the nicotine in cigarettes. Medicines may: Help you to stop having the desire to smoke (cravings). Help to stop the problems that come when you stop smoking (withdrawal symptoms). Your doctor may ask you to use: Nicotine patches, gum, or lozenges. Nicotine inhalers or sprays. Non-nicotine medicine that is taken by mouth. Find resources Find resources and other ways to help you quit smoking and remain smoke-free after you quit. These resources are most helpful when you use them often. They include: Online chats with a Veterinary surgeon. Phone quitlines. Printed Materials engineer. Support groups or group counseling. Text messaging programs. Mobile phone apps. Use apps on your mobile phone or tablet that can help you stick to your quit plan. There are many free apps for mobile phones and tablets as well as websites. Examples include Quit Guide  from the [facility] and smokefree.gov  What things can I do to make it easier to quit?  Talk to your family and friends. Ask them to support and encourage you. Call a phone quitline (1-800-QUIT-NOW), reach out to support groups, or work with a Veterinary surgeon. Ask people who smoke to not smoke around you. Avoid places that make you want to smoke, such as: Bars. Parties. Smoke-break areas at work. Spend time with people who do not smoke. Lower the stress in your life. Stress can make you want to smoke. Try these things to help your stress: Getting regular exercise. Doing deep-breathing  exercises. Doing yoga. Meditating. Doing a body scan. To do this, close your eyes, focus on one area of your body at a time from head to toe. Notice which parts of your body are tense. Try to relax the muscles in those areas. How will I feel when I quit smoking? Day 1 to 3 weeks Within the first 24 hours, you may start to have some problems that come from quitting tobacco. These problems are very bad 2-3 days after you quit, but they do not often last for more than 2-3 weeks. You may get these symptoms: Mood swings. Feeling restless, nervous, angry, or annoyed. Trouble concentrating. Dizziness. Strong desire for high-sugar foods and nicotine. Weight gain. Trouble pooping (constipation). Feeling like you may vomit (nausea). Coughing or a sore throat. Changes in how the medicines that you take for other issues work in your body. Depression. Trouble sleeping (insomnia). Week 3 and afterward After the first 2-3 weeks of quitting, you may start to notice more positive results, such as: Better sense of smell and taste. Less coughing and sore throat. Slower heart rate. Lower blood pressure. Clearer skin. Better breathing. Fewer sick days. Quitting smoking can be hard. Do not give up if you fail the first time. Some people need to try a few times before they succeed. Do your best to stick to your quit plan, and talk with yourdoctor if you have any questions or concerns. Summary Smoking tobacco is the leading cause of preventable death. Quitting smoking can be hard, but it is one of the best things that you can do for your health. When you decide to quit smoking, make a plan to help you succeed. Quit smoking right away, not slowly over a period of time. When you start quitting, seek help from your doctor, family, or friends. This information is not intended to replace advice given to you by your health care provider. Make sure you discuss any questions you have with your healthcare  provider. Document Revised: 12/31/2018 Document Reviewed: 06/26/2018 Elsevier Patient Education  2022 [employer].

## 2020-10-26 ENCOUNTER — Other Ambulatory Visit: Payer: Self-pay | Admitting: Family Medicine

## 2020-11-19 ENCOUNTER — Other Ambulatory Visit: Payer: Self-pay | Admitting: Physician Assistant

## 2020-11-23 ENCOUNTER — Ambulatory Visit (INDEPENDENT_AMBULATORY_CARE_PROVIDER_SITE_OTHER): Payer: PPO | Admitting: Family Medicine

## 2020-11-23 ENCOUNTER — Other Ambulatory Visit: Payer: Self-pay

## 2020-11-23 VITALS — BP 138/64 | HR 74 | Temp 97.3°F | Resp 16 | Ht 60.0 in | Wt 123.6 lb

## 2020-11-23 DIAGNOSIS — I10 Essential (primary) hypertension: Secondary | ICD-10-CM | POA: Diagnosis not present

## 2020-11-23 DIAGNOSIS — M545 Low back pain, unspecified: Secondary | ICD-10-CM | POA: Diagnosis not present

## 2020-11-23 DIAGNOSIS — Z23 Encounter for immunization: Secondary | ICD-10-CM

## 2020-11-23 DIAGNOSIS — F17219 Nicotine dependence, cigarettes, with unspecified nicotine-induced disorders: Secondary | ICD-10-CM | POA: Diagnosis not present

## 2020-11-23 DIAGNOSIS — I701 Atherosclerosis of renal artery: Secondary | ICD-10-CM

## 2020-11-23 DIAGNOSIS — G8929 Other chronic pain: Secondary | ICD-10-CM | POA: Diagnosis not present

## 2020-11-23 DIAGNOSIS — J018 Other acute sinusitis: Secondary | ICD-10-CM | POA: Diagnosis not present

## 2020-11-23 DIAGNOSIS — E782 Mixed hyperlipidemia: Secondary | ICD-10-CM

## 2020-11-23 DIAGNOSIS — I739 Peripheral vascular disease, unspecified: Secondary | ICD-10-CM | POA: Diagnosis not present

## 2020-11-23 MED ORDER — AMOXICILLIN 875 MG PO TABS: 875.0000 mg | ORAL_TABLET | Freq: Two times a day (BID) | ORAL | 0 refills | Status: AC

## 2020-11-23 MED ORDER — TIZANIDINE HCL 2 MG PO TABS: 2.0000 mg | ORAL_TABLET | Freq: Four times a day (QID) | ORAL | 0 refills | Status: DC | PRN

## 2020-11-23 NOTE — Progress Notes (Signed)
Subjective:  Patient ID: Carla Romero, female    DOB: Apr 20, 1946  Age: 75 y.o. MRN: [id number]  Chief Complaint  Patient presents with   Hyperlipidemia   Hypertension    HPI Hyperlipidemia: on zetia and rosuvastatin. Hypertension: on amlodipine 10 mg once daily and valsartan 320 mg once daily. Bp has been good. No headaches or Blurry vision.   Awoke with nausea. No vomiting. Over the last week has had increased nasal congestion and PND. NO fever, chills, sweats, earache.  Mild sore throat and hoarseness. She also was cleaning last week and felt like she pulled her back out. Happened previously few years ago. Was given prednisone and tramadol for a brief course. No radiculopathy.   Current Outpatient Medications on File Prior to Visit  Medication Sig Dispense Refill   alum & mag hydroxide-simeth (MAALOX/MYLANTA) 200-200-20 MG/5ML suspension Take 30 mLs by mouth 2 (two) times daily as needed for indigestion or heartburn.     amLODipine (NORVASC) 10 MG tablet TAKE 1 TABLET BY MOUTH ONCE DAILY. 90 tablet 0   Artificial Tear Solution (SOOTHE XP OP) Apply 1 drop to eye daily as needed (dry eyes).     aspirin EC 81 MG tablet Take 81 mg by mouth daily.     B COMPLEX VITAMINS SL Place 1 tablet under the tongue daily.     cholecalciferol (VITAMIN D3) 25 MCG (1000 UNIT) tablet Take 1,000 Units by mouth daily.     clopidogrel (PLAVIX) 75 MG tablet TAKE 1 TABLET BY MOUTH ONCE DAILY. 90 tablet 3   ezetimibe (ZETIA) 10 MG tablet TAKE 1 TABLET BY MOUTH AT BEDTIME 90 tablet 3   ibuprofen (ADVIL,MOTRIN) 200 MG tablet Take 400 mg by mouth 2 (two) times daily as needed for headache or moderate pain.     magnesium gluconate (MAGONATE) 500 MG tablet Take 500 mg by mouth daily.     magnesium hydroxide (MILK OF MAGNESIA) 400 MG/5ML suspension Take by mouth daily as needed for mild constipation.     rosuvastatin (CRESTOR) 5 MG tablet TAKE 1 TABLET BY MOUTH 3 TIMES WEEKLY 30 tablet 0   trolamine salicylate  (ASPERCREME) 10 % cream Apply 1 application topically as needed for muscle pain.     valsartan (DIOVAN) 320 MG tablet TAKE 1 TABLET BY MOUTH ONCE DAILY 90 tablet 1   No current facility-administered medications on file prior to visit.   Past Medical History:  Diagnosis Date   Acute viral syndrome    Anemia    Vit D deficiency   Arteritis, unspecified (HCC) November 01, 2013   Arthritis    "fingers, knees, upper spine" (06/24/2016)   B12 deficiency anemia    "had injections in the 1970s"   CAD (coronary artery disease)    COPD (chronic obstructive pulmonary disease) (HCC)    "no one has ever told me I have this" (06/24/2016)   Diverticulitis    GERD (gastroesophageal reflux disease)    Hyperlipidemia    Hypertension    Osteoporosis    Pain October 28, 2013   Left Lower quadrant / back   Thrombocytopenia [facility])    Past Surgical History:  Procedure Laterality Date   ABDOMINAL AORTOGRAM W/LOWER EXTREMITY N/A 06/24/2016   Procedure: Abdominal Aortogram w/Lower Extremity;  Surgeon: Nada Libman, MD;  Location: [facility];  Service: Cardiovascular;  Laterality: N/A;   ABDOMINAL HYSTERECTOMY     APPENDECTOMY     COLONOSCOPY  Aug. 4, 2014   FEMORAL ARTERY  STENT Left 2018   [employer] 6 mm x 80 mm x 130 cm. Dr Myra Gianotti [phone]   TUBAL LIGATION      Family History  Problem Relation Age of Onset   Hypertension Mother    Hyperlipidemia Mother    Hypertension Sister    Social History   Socioeconomic History   Marital status: Widowed    Spouse name: Not on file   Number of children: 2   Years of education: Not on file   Highest education level: Not on file  Occupational History   Not on file  Tobacco Use   Smoking status: Every Day    Packs/day: 0.12    Years: 43.00    Pack years: 5.16    Types: Cigarettes   Smokeless tobacco: Never   Tobacco comments:    5-7 cigarettes per day  Vaping Use   Vaping Use: Never used  Substance and Sexual Activity   Alcohol  use: Yes    Alcohol/week: 2.0 - 3.0 standard drinks    Types: 2 - 3 Standard drinks or equivalent per week    Comment: 06/24/2016 "might have a few drinks/year"   Drug use: No   Sexual activity: Not Currently    Birth control/protection: Post-menopausal  Other Topics Concern   Not on file  Social History Narrative   Not on file   Social Determinants of Health   Financial Resource Strain: Not on file  Food Insecurity: No Food Insecurity   Worried About Programme researcher, broadcasting/film/video in the Last Year: Never true   Ran Out of Food in the Last Year: Never true  Transportation Needs: No Transportation Needs   Lack of Transportation (Medical): No   Lack of Transportation (Non-Medical): No  Physical Activity: Not on file  Stress: Not on file  Social Connections: Not on file    Review of Systems  Constitutional:  Positive for fatigue. Negative for chills and fever.  HENT:  Positive for congestion. Negative for rhinorrhea and sore throat.   Respiratory:  Positive for cough. Negative for shortness of breath.   Cardiovascular:  Negative for chest pain and palpitations.  Gastrointestinal:  Positive for constipation and nausea. Negative for abdominal pain, diarrhea and vomiting.  Genitourinary:  Negative for dysuria and urgency.  Musculoskeletal:  Positive for arthralgias and back pain. Negative for myalgias.  Neurological:  Positive for headaches. Negative for dizziness, weakness and light-headedness.  Psychiatric/Behavioral:  Negative for dysphoric mood. The patient is not nervous/anxious.     Objective:  BP 138/64   Pulse 74   Temp (!) 97.3 F (36.3 C)   Resp 16   Ht 5' (1.524 m)   Wt 123 lb 9.6 oz (56.1 kg)   BMI 24.14 kg/m   BP/Weight 11/23/2020 10/11/2020 09/11/2020  Systolic BP 138 132 137  Diastolic BP 64 80 75  Wt. (Lbs) 123.6 122.8 124.1  BMI 24.14 23.98 23.45    Physical Exam Vitals reviewed.  Constitutional:      Appearance: Normal appearance. She is normal weight.  HENT:      Right Ear: Tympanic membrane, ear canal and external ear normal.     Left Ear: Tympanic membrane, ear canal and external ear normal.     Nose: Nose normal.     Comments: Sinus tenderness    Mouth/Throat:     Pharynx: Oropharynx is clear.  Neck:     Vascular: No carotid bruit.  Cardiovascular:     Rate and Rhythm: Normal rate and  regular rhythm.     Pulses: Normal pulses.     Heart sounds: Normal heart sounds. No murmur heard. Pulmonary:     Effort: Pulmonary effort is normal. No respiratory distress.     Breath sounds: Normal breath sounds.  Abdominal:     General: Abdomen is flat. Bowel sounds are normal.     Palpations: Abdomen is soft.     Tenderness: There is no abdominal tenderness.  Neurological:     Mental Status: She is alert and oriented to person, place, and time.  Psychiatric:        Mood and Affect: Mood normal.        Behavior: Behavior normal.    Diabetic Foot Exam - Simple   No data filed      Lab Results  Component Value Date   WBC 7.9 11/23/2020   HGB 13.6 11/23/2020   HCT 40.0 11/23/2020   PLT 262 11/23/2020   GLUCOSE 86 11/23/2020   CHOL 177 11/23/2020   TRIG 103 11/23/2020   HDL 52 11/23/2020   LDLCALC 106 (H) 11/23/2020   ALT 12 11/23/2020   AST 23 11/23/2020   NA 139 11/23/2020   K 4.3 11/23/2020   CL 105 11/23/2020   CREATININE 1.10 (H) 11/23/2020   BUN 12 11/23/2020   CO2 20 11/23/2020   TSH 2.750 11/23/2020      Assessment & Plan:  1. Essential hypertension Well controlled.  No changes to medicines.  Continue to work on eating a healthy diet and exercise.  Labs drawn today.  - CBC with Differential/Platelet - Comprehensive metabolic panel - TSH  2. Mixed hyperlipidemia Not quite at goal. No changes to medicines.  Continue to work on eating a healthy diet and exercise.  Labs drawn today.  - Lipid panel  3. Chronic left-sided low back pain without sciatica Start tizanidine. - tiZANidine (ZANAFLEX) 2 MG tablet; Take 1  tablet (2 mg total) by mouth every 6 (six) hours as needed for muscle spasms.  Dispense: 60 tablet; Refill: 0  4. Acute non-recurrent sinusitis of other sinus - amoxicillin (AMOXIL) 875 MG tablet; Take 1 tablet (875 mg total) by mouth 2 (two) times daily for 10 days.  Dispense: 20 tablet; Refill: 0   5. Left renal artery stenosis (HCC) 60% on last study in 2018.  On plavix and statin.  6. PAD (peripheral artery disease) (HCC) ON plavix and statin. S/p stenting.   7. Cigarette nicotine dependence with nicotine-induced disorder Refuses to quit smoking  Follow-up: Return in about 6 months (around 05/26/2021) for fasting.  An After Visit Summary was printed and given to the patient.  Blane Ohara, MD [facility] [phone]

## 2020-11-24 LAB — CBC WITH DIFFERENTIAL/PLATELET
Basophils Absolute: 0.1 10*3/uL (ref 0.0–0.2)
Basos: 1 %
EOS (ABSOLUTE): 0.2 10*3/uL (ref 0.0–0.4)
Eos: 2 %
Hematocrit: 40 % (ref 34.0–46.6)
Hemoglobin: 13.6 g/dL (ref 11.1–15.9)
Immature Grans (Abs): 0 10*3/uL (ref 0.0–0.1)
Immature Granulocytes: 0 %
Lymphocytes Absolute: 2.1 10*3/uL (ref 0.7–3.1)
Lymphs: 27 %
MCH: 30.8 pg (ref 26.6–33.0)
MCHC: 34 g/dL (ref 31.5–35.7)
MCV: 91 fL (ref 79–97)
Monocytes Absolute: 0.5 10*3/uL (ref 0.1–0.9)
Monocytes: 6 %
Neutrophils Absolute: 5 10*3/uL (ref 1.4–7.0)
Neutrophils: 64 %
Platelets: 262 10*3/uL (ref 150–450)
RBC: 4.42 x10E6/uL (ref 3.77–5.28)
RDW: 12.8 % (ref 11.7–15.4)
WBC: 7.9 10*3/uL (ref 3.4–10.8)

## 2020-11-24 LAB — COMPREHENSIVE METABOLIC PANEL
ALT: 12 IU/L (ref 0–32)
AST: 23 IU/L (ref 0–40)
Albumin/Globulin Ratio: 1.9 (ref 1.2–2.2)
Albumin: 4.5 g/dL (ref 3.7–4.7)
Alkaline Phosphatase: 107 IU/L (ref 44–121)
BUN/Creatinine Ratio: 11 — ABNORMAL LOW (ref 12–28)
BUN: 12 mg/dL (ref 8–27)
Bilirubin Total: 0.3 mg/dL (ref 0.0–1.2)
CO2: 20 mmol/L (ref 20–29)
Calcium: 9.1 mg/dL (ref 8.7–10.3)
Chloride: 105 mmol/L (ref 96–106)
Creatinine, Ser: 1.1 mg/dL — ABNORMAL HIGH (ref 0.57–1.00)
Globulin, Total: 2.4 g/dL (ref 1.5–4.5)
Glucose: 86 mg/dL (ref 65–99)
Potassium: 4.3 mmol/L (ref 3.5–5.2)
Sodium: 139 mmol/L (ref 134–144)
Total Protein: 6.9 g/dL (ref 6.0–8.5)
eGFR: 53 mL/min/{1.73_m2} — ABNORMAL LOW (ref >59)

## 2020-11-24 LAB — LIPID PANEL
Chol/HDL Ratio: 3.4 ratio (ref 0.0–4.4)
Cholesterol, Total: 177 mg/dL (ref 100–199)
HDL: 52 mg/dL (ref >39)
LDL Chol Calc (NIH): 106 mg/dL — ABNORMAL HIGH (ref 0–99)
Triglycerides: 103 mg/dL (ref 0–149)
VLDL Cholesterol Cal: 19 mg/dL (ref 5–40)

## 2020-11-24 LAB — TSH: TSH: 2.75 u[IU]/mL (ref 0.450–4.500)

## 2020-11-24 LAB — CARDIOVASCULAR RISK ASSESSMENT

## 2020-11-26 DIAGNOSIS — H25813 Combined forms of age-related cataract, bilateral: Secondary | ICD-10-CM | POA: Diagnosis not present

## 2020-11-26 DIAGNOSIS — H2513 Age-related nuclear cataract, bilateral: Secondary | ICD-10-CM | POA: Diagnosis not present

## 2020-11-26 DIAGNOSIS — H268 Other specified cataract: Secondary | ICD-10-CM | POA: Diagnosis not present

## 2020-11-26 DIAGNOSIS — H40003 Preglaucoma, unspecified, bilateral: Secondary | ICD-10-CM | POA: Diagnosis not present

## 2020-12-02 ENCOUNTER — Encounter: Payer: Self-pay | Admitting: Family Medicine

## 2020-12-02 DIAGNOSIS — G8929 Other chronic pain: Secondary | ICD-10-CM | POA: Insufficient documentation

## 2020-12-02 DIAGNOSIS — J019 Acute sinusitis, unspecified: Secondary | ICD-10-CM | POA: Insufficient documentation

## 2020-12-02 DIAGNOSIS — M545 Low back pain, unspecified: Secondary | ICD-10-CM | POA: Insufficient documentation

## 2020-12-03 ENCOUNTER — Telehealth: Payer: Self-pay

## 2020-12-03 NOTE — Chronic Care Management (AMB) (Signed)
Chronic Care Management Pharmacy Assistant   Name: Carla Romero  MRN: [id number] DOB: 1945-12-22   Reason for Encounter: Disease State/ Cholesterol  Recent office visits:  10-11-2020 Jacklynn Bue, LPN. Medicare wellness visit. Dexa scan and colonoscopy ordered.  11-23-2020 Mickey Farber, MD. START Amoxicillin 875 mg twice daily for 10 days. START Tizanidine 2 mg every 6 hours as needed. STOP vitamin C, Calcium, Benadryl and lidocaine patch.  Recent consult visits:  None  Hospital visits:  None in previous 6 months  Medications: Outpatient Encounter Medications as of 12/03/2020  Medication Sig   alum & mag hydroxide-simeth (MAALOX/MYLANTA) 200-200-20 MG/5ML suspension Take 30 mLs by mouth 2 (two) times daily as needed for indigestion or heartburn.   amLODipine (NORVASC) 10 MG tablet TAKE 1 TABLET BY MOUTH ONCE DAILY.   amoxicillin (AMOXIL) 875 MG tablet Take 1 tablet (875 mg total) by mouth 2 (two) times daily for 10 days.   Artificial Tear Solution (SOOTHE XP OP) Apply 1 drop to eye daily as needed (dry eyes).   aspirin EC 81 MG tablet Take 81 mg by mouth daily.   B COMPLEX VITAMINS SL Place 1 tablet under the tongue daily.   cholecalciferol (VITAMIN D3) 25 MCG (1000 UNIT) tablet Take 1,000 Units by mouth daily.   clopidogrel (PLAVIX) 75 MG tablet TAKE 1 TABLET BY MOUTH ONCE DAILY.   ezetimibe (ZETIA) 10 MG tablet TAKE 1 TABLET BY MOUTH AT BEDTIME   ibuprofen (ADVIL,MOTRIN) 200 MG tablet Take 400 mg by mouth 2 (two) times daily as needed for headache or moderate pain.   magnesium gluconate (MAGONATE) 500 MG tablet Take 500 mg by mouth daily.   magnesium hydroxide (MILK OF MAGNESIA) 400 MG/5ML suspension Take by mouth daily as needed for mild constipation.   rosuvastatin (CRESTOR) 5 MG tablet TAKE 1 TABLET BY MOUTH 3 TIMES WEEKLY   tiZANidine (ZANAFLEX) 2 MG tablet Take 1 tablet (2 mg total) by mouth every 6 (six) hours as needed for muscle spasms.   trolamine  salicylate (ASPERCREME) 10 % cream Apply 1 application topically as needed for muscle pain.   valsartan (DIOVAN) 320 MG tablet TAKE 1 TABLET BY MOUTH ONCE DAILY   No facility-administered encounter medications on file as of 12/03/2020.   Lipid Panel    Component Value Date/Time   CHOL 177 11/23/2020 0800   TRIG 103 11/23/2020 0800   HDL 52 11/23/2020 0800   LDLCALC 106 (H) 11/23/2020 0800    10-year ASCVD risk score: The 10-year ASCVD risk score Denman George DC Jr., et al., 2013) is: 30.5%   Values used to calculate the score:     Age: 74 years     Sex: Female     Is Non-Hispanic African American: No     Diabetic: No     Tobacco smoker: Yes     Systolic Blood Pressure: 138 mmHg     Is BP treated: Yes     HDL Cholesterol: 52 mg/dL     Total Cholesterol: 177 mg/dL  Current antihyperlipidemic regimen:  Rosuvastatin 5 mg three times weekly (Patient states twice weekly) Zetia 10 mg daily  Previous antihyperlipidemic medications tried: None  ASCVD risk enhancing conditions: age >74 and HTN  What recent interventions/DTPs have been made by any provider to improve Cholesterol control since last CPP Visit:  Educated on Cholesterol goals Benefits of statin for ASCVD risk reduction Importance of limiting foods high in cholesterol Exercise goal of 150 minutes per week Counseled on diet  and exercise extensively  Any recent hospitalizations or ED visits since last visit with CPP? No  What diet changes have been made to improve Cholesterol?  Patient states she has limited her fried food intake. Patient states she has increased her fruit and vegetable intake but plans to do better.  What exercise is being done to improve Cholesterol?  Patient states she use to walk daily but has injured her back a few weeks ago. Patient states she will go back to walking daily once she feels better.  Adherence Review: Does the patient have >5 day gap between last estimated fill dates? No  Star Rating  Drugs Medication Name Last Fill Days supply Rosuvastatin 5 mg 11-19-2020 70DS Valsartan 320 mg 10-01-2020 90DS  Care Gaps: Last annual wellness visit? 10-11-2020 Hep C screening overdue Shingrix overdue Tdap overdue Covid booster overdue RAF= 0.325%  Huey Romans [facility] Clinical Pharmacist Assistant [phone]

## 2021-01-22 ENCOUNTER — Ambulatory Visit (INDEPENDENT_AMBULATORY_CARE_PROVIDER_SITE_OTHER): Payer: PPO

## 2021-01-22 ENCOUNTER — Other Ambulatory Visit: Payer: Self-pay

## 2021-01-22 DIAGNOSIS — I10 Essential (primary) hypertension: Secondary | ICD-10-CM

## 2021-01-22 DIAGNOSIS — E782 Mixed hyperlipidemia: Secondary | ICD-10-CM

## 2021-01-22 DIAGNOSIS — I739 Peripheral vascular disease, unspecified: Secondary | ICD-10-CM

## 2021-01-22 NOTE — Patient Instructions (Signed)
Visit Information   Goals Addressed   None    Patient Care Plan: CCM Pharmacy Care Plan     Problem Identified: htn, hld   Priority: High  Onset Date: 07/23/2020     Goal: Disease Management   Start Date: 07/23/2020  Expected End Date: 07/23/2021  Recent Progress: On track  Priority: High  Note:    Current Barriers:  Does not maintain contact with provider office   Pharmacist Clinical Goal(s):  Patient will schedule follow-up fasting blood work and chronic appointment.  through collaboration with PharmD and provider.   Interventions: 1:1 collaboration with Blane Ohara, MD regarding development and update of comprehensive plan of care as evidenced by provider attestation and co-signature Inter-disciplinary care team collaboration (see longitudinal plan of care) Comprehensive medication review performed; medication list updated in electronic medical record  Hypertension (BP goal <130/80) BP Readings from Last 3 Encounters:  11/23/20 138/64  10/11/20 132/80  09/11/20 137/75  -Controlled -Current treatment: valsartan 320 mg daily  Amlodipine 10 mg daily  -Medications previously tried: ace inhibitors  -Current home readings:  April 2022: 133/63, 132/65, 129/57, 126/56 September 2022: Patient stopped testing BP at home -Current dietary habits: drinking Boost daily. Has had a hard time finding boost for women.  -Current exercise habits: line dances, walking and completing exercise.  -Denies hypotensive/hypertensive symptoms -Educated on BP goals and benefits of medications for prevention of heart attack, stroke and kidney damage; Daily salt intake goal < 2300 mg; Exercise goal of 150 minutes per week; Importance of home blood pressure monitoring; -Counseled to monitor BP at home daily, document, and provide log at future appointments -Counseled on diet and exercise extensively September 2022: Recommended start testing BP 1x/week  Hyperlipidemia: (LDL goal < 100) Lab  Results  Component Value Date   CHOL 177 11/23/2020   CHOL 200 (H) 08/21/2020   CHOL 207 (H) 03/21/2020   Lab Results  Component Value Date   HDL 52 11/23/2020   HDL 58 08/21/2020   HDL 54 03/21/2020   Lab Results  Component Value Date   LDLCALC 106 (H) 11/23/2020   LDLCALC 121 (H) 08/21/2020   LDLCALC 132 (H) 03/21/2020   Lab Results  Component Value Date   TRIG 103 11/23/2020   TRIG 117 08/21/2020   TRIG 117 03/21/2020   Lab Results  Component Value Date   CHOLHDL 3.4 11/23/2020   CHOLHDL 3.4 08/21/2020   CHOLHDL 3.8 03/21/2020  No results found for: LDLDIRECT -Not ideally controlled -Current treatment: Zetia 10 mg daily at bedtime  Crestor 5 mg three times weekly -Medications previously tried: simvastatin   -Current dietary patterns: drinks 1 boost daily. Eats quick meals otherwise.  -Current exercise habits: line dancing, walking and exercises.  -Educated on Cholesterol goals;  Benefits of statin for ASCVD risk reduction; Importance of limiting foods high in cholesterol; Exercise goal of 150 minutes per week; -Counseled on diet and exercise extensively September 2022: Patient stopped Rosuvastatin 5mg  3x/week due to aches and pains. Will ask PCP how to proceed  PVD (Goal: manage bp, cholesterol, anticoagulation) -Not ideally controlled -Current treatment  aspirin ec 81 mg daily  Clopidogrel 75 mg daily  Zetia 10 mg daily at bedtime  Crestor 5 mg three times weekly Amlodipine 10 mg daily  Valsartan 320 mg daily  -Medications previously tried: simvastatin  -Counseled on diet and exercise extensively September 2022: Patient stopped Rosuvastatin 5mg  3x/week due to aches and pains. Will ask PCP how to proceed   Patient Goals/Self-Care  Activities Patient will:  - take medications as prescribed focus on medication adherence by using pill box check blood pressure daily , document, and provide at future appointments target a minimum of 150 minutes of moderate  intensity exercise weekly  Follow Up Plan: Telephone follow up appointment with care management team member scheduled for: March 2023      The patient verbalized understanding of instructions, educational materials, and care plan provided today and declined offer to receive copy of patient instructions, educational materials, and care plan.  The pharmacy team will reach out to the patient again over the next 90 days.   Zettie Pho, [facility]

## 2021-01-22 NOTE — Progress Notes (Signed)
Chronic Care Management Pharmacy Note  01/22/2021 Name:  Carla Romero MRN:  [id number] DOB:  [birth year]   Plan Updates:  Patient stopped statin completely, was increased from 2x/week to 3x/week and she started having aches and pains.  The 2x/week didn't bother her so could we have the front desk call her and ask her to start again, while they're doing that could they schedule a f/u with you for Feb 2023? Your note says a 6 month f/u schedule but she has no future appt scheduled Patient was very emotional during the visit, her son passed away 2 weeks ago   Subjective: Carla Romero is an 75 y.o. year old female who is a primary patient of Cox, Kirsten, MD.  The CCM team was consulted for assistance with disease management and care coordination needs.    Engaged with patient by telephone for follow up visit in response to provider referral for pharmacy case management and/or care coordination services.   Consent to Services:  The patient was given information about Chronic Care Management services, agreed to services, and gave verbal consent prior to initiation of services.  Please see initial visit note for detailed documentation.   Patient Care Team: Carla Brome, MD as PCP - General (Family Medicine) Carla Romero, [place] [address], PA-C as Physician Assistant (Physician Assistant) Carla Romero, [facility] (Inactive) as Pharmacist (Pharmacist) Carla Romero, [facility] as Pharmacist (Pharmacist)  Recent office visits:  10-11-2020 Carla Noe, LPN. Medicare wellness visit. Dexa scan and colonoscopy ordered.   11-23-2020 Carla Dress, MD. START Amoxicillin 875 mg twice daily for 10 days. START Tizanidine 2 mg every 6 hours as needed. STOP vitamin C, Calcium, Benadryl and lidocaine patch.   Recent consult visits:  None   Hospital visits:  None in previous 6 months  Objective:  Lab Results  Component Value Date   CREATININE 1.10 (H) 11/23/2020   BUN 12 11/23/2020   GFRNONAA 51 (L)  03/21/2020   GFRAA 59 (L) 03/21/2020   NA 139 11/23/2020   K 4.3 11/23/2020   CALCIUM 9.1 11/23/2020   CO2 20 11/23/2020   GLUCOSE 86 11/23/2020    No results found for: HGBA1C, FRUCTOSAMINE, GFR, MICROALBUR  Last diabetic Eye exam: No results found for: HMDIABEYEEXA  Last diabetic Foot exam: No results found for: HMDIABFOOTEX   Lab Results  Component Value Date   CHOL 177 11/23/2020   HDL 52 11/23/2020   LDLCALC 106 (H) 11/23/2020   TRIG 103 11/23/2020   CHOLHDL 3.4 11/23/2020    Hepatic Function Latest Ref Rng & Units 11/23/2020 08/21/2020 03/21/2020  Total Protein 6.0 - 8.5 g/dL 6.9 6.7 7.0  Albumin 3.7 - 4.7 g/dL 4.5 4.3 4.6  AST 0 - 40 IU/L _0 ALT 0 - 32 IU/L _1 Alk Phosphatase 44 - 121 IU/L 107 96 113  Total Bilirubin 0.0 - 1.2 mg/dL 0.3 <0.2 0.3    Lab Results  Component Value Date/Time   TSH 2.750 11/23/2020 08:00 AM   TSH 2.110 03/21/2020 08:16 AM    CBC Latest Ref Rng & Units 11/23/2020 08/21/2020 03/21/2020  WBC 3.4 - 10.8 x10E3/uL 7.9 8.5 9.0  Hemoglobin 11.1 - 15.9 g/dL 13.6 12.9 13.5  Hematocrit 34.0 - 46.6 % 40.0 39.2 38.7  Platelets 150 - 450 x10E3/uL 262 234 253    No results found for: VD25OH  Clinical ASCVD: No  The 10-year ASCVD risk score (Arnett DK, et al., 2019) is: 30.5%  Values used to calculate the score:     Age: 75 years     Sex: Female     Is Non-Hispanic African American: No     Diabetic: No     Tobacco smoker: Yes     Systolic Blood Pressure: 74 mmHg     Is BP treated: Yes     HDL Cholesterol: 52 mg/dL     Total Cholesterol: 177 mg/dL    Depression screen [facility] 2/9 11/23/2020 10/11/2020 08/21/2020  Decreased Interest 0 0 0  Down, Depressed, Hopeless 0 0 0  PHQ - 2 Score 0 0 0  Altered sleeping - - 0  Tired, decreased energy - - 1  Change in appetite - - 0  Feeling bad or failure about yourself  - - 0  Trouble concentrating - - 0  Moving slowly or fidgety/restless - - 0  Suicidal thoughts - - 0  PHQ-9 Score - - 1   Difficult doing work/chores - - Not difficult at all     Social History   Tobacco Use  Smoking Status Every Day   Packs/day: 0.12   Years: 43.00   Pack years: 5.16   Types: Cigarettes  Smokeless Tobacco Never  Tobacco Comments   5-7 cigarettes per day   BP Readings from Last 3 Encounters:  11/23/20 138/64  10/11/20 132/80  09/11/20 137/75   Pulse Readings from Last 3 Encounters:  11/23/20 74  10/11/20 72  09/11/20 (!) 49   Wt Readings from Last 3 Encounters:  11/23/20 123 lb 9.6 oz (56.1 kg)  10/11/20 122 lb 12.8 oz (55.7 kg)  09/11/20 124 lb 1.6 oz (56.3 kg)   BMI Readings from Last 3 Encounters:  11/23/20 24.14 kg/m  10/11/20 23.98 kg/m  09/11/20 23.45 kg/m    Assessment/Interventions: Review of patient past medical history, allergies, medications, health status, including review of consultants reports, laboratory and other test data, was performed as part of comprehensive evaluation and provision of chronic care management services.   SDOH:  (Social Determinants of Health) assessments and interventions performed: Yes SDOH Interventions    Flowsheet Row Most Recent Value  SDOH Interventions   Financial Strain Interventions Intervention Not Indicated  Transportation Interventions Intervention Not Indicated      SDOH Screenings   Alcohol Screen: Not on file  Depression (PHQ2-9): Low Risk    PHQ-2 Score: 0  Financial Resource Strain: Low Risk    Difficulty of Paying Living Expenses: Not hard at all  Food Insecurity: No Food Insecurity   Worried About Charity fundraiser in the Last Year: Never true   Arboriculturist in the Last Year: Never true  Housing: Low Risk    Last Housing Risk Score: 0  Physical Activity: Not on file  Social Connections: Not on file  Stress: Not on file  Tobacco Use: High Risk   Smoking Tobacco Use: Every Day   Smokeless Tobacco Use: Never  Transportation Needs: No Transportation Needs   Lack of Transportation (Medical):  No   Lack of Transportation (Non-Medical): No    CCM Care Plan  Allergies  Allergen Reactions   Ace Inhibitors Cough   Trilipix [Choline Fenofibrate] Other (See Comments)    Muscle cramps, muscle stiffness   Zocor [Simvastatin] Other (See Comments)    Myalgia    Medications Reviewed Today     Reviewed by Carla Romero, [facility] (Pharmacist) on 01/22/21 at [place] List Status: <None>   Medication Order Taking? Sig  Documenting Provider Last Dose Status Informant  alum & mag hydroxide-simeth (MAALOX/MYLANTA) 200-200-20 MG/5ML suspension [id number]  Take 30 mLs by mouth 2 (two) times daily as needed for indigestion or heartburn. [provider]  Active   amLODipine (NORVASC) 10 MG tablet [id number] Yes TAKE 1 TABLET BY MOUTH ONCE DAILY. Cox, Kirsten, MD Taking Active   Artificial Tear Solution (SOOTHE XP OP) [id number]  Apply 1 drop to eye daily as needed (dry eyes). [provider]  Active Self  aspirin EC 81 MG tablet [id number] Yes Take 81 mg by mouth daily. [provider] Taking Active Self  B COMPLEX VITAMINS SL [id number]  Place 1 tablet under the tongue daily. [provider]  Active   cholecalciferol (VITAMIN D3) 25 MCG (1000 UNIT) tablet [id number] Yes Take 1,000 Units by mouth daily. [provider] Taking Active   clopidogrel (PLAVIX) 75 MG tablet [id number] Yes TAKE 1 TABLET BY MOUTH ONCE DAILY. Cox, Kirsten, MD Taking Active   ezetimibe (ZETIA) 10 MG tablet [id number] Yes TAKE 1 TABLET BY MOUTH AT BEDTIME Cox, Kirsten, MD Taking Active   ibuprofen (ADVIL,MOTRIN) 200 MG tablet [id number]  Take 400 mg by mouth 2 (two) times daily as needed for headache or moderate pain. [provider]  Active   magnesium gluconate (MAGONATE) 500 MG tablet [id number]  Take 500 mg by mouth daily. [provider]  Active   magnesium hydroxide (MILK OF MAGNESIA) 400 MG/5ML suspension [id number]  Take by mouth daily as needed for mild  constipation. [provider]  Active   rosuvastatin (CRESTOR) 5 MG tablet [id number] No TAKE 1 TABLET BY MOUTH 3 TIMES WEEKLY  Patient not taking: Reported on 01/22/2021   Marge Duncans, PA-C Not Taking Active   tiZANidine (ZANAFLEX) 2 MG tablet [id number]  Take 1 tablet (2 mg total) by mouth every 6 (six) hours as needed for muscle spasms. Cox, Kirsten, MD  Active   trolamine salicylate (ASPERCREME) 10 % cream [id number]  Apply 1 application topically as needed for muscle pain. [provider]  Active   valsartan (DIOVAN) 320 MG tablet [id number] Yes TAKE 1 TABLET BY MOUTH ONCE DAILY Lillard Anes, MD Taking Active             Patient Active Problem List   Diagnosis Date Noted   Chronic left-sided low back pain without sciatica 12/02/2020   Acute infection of nasal sinus 12/02/2020   Essential hypertension 12/18/2019   Mixed hyperlipidemia 12/18/2019   PVD (peripheral vascular disease) ([place]) 12/18/2019   Annual physical exam 10/12/2019   Cigarette nicotine dependence with nicotine-induced disorder 10/12/2019   Bleeding at insertion site 06/24/2016   Aortitis ([place]) 11/28/2013    Immunization History  Administered Date(s) Administered   Moderna SARS-COV2 Booster Vaccination 03/20/2020   Moderna Sars-Covid-2 Vaccination 06/03/2019, 07/01/2019   Pneumococcal Conjugate-13 03/28/2015   Pneumococcal Polysaccharide-23 03/09/2011, 03/30/2018   Tdap 04/21/2009   Zoster, Live 04/22/2007    Conditions to be addressed/monitored:  Hypertension, Hyperlipidemia and Peripheral Vascular Disease  Care Plan : CCM Pharmacy Care Plan  Updates made by Carla Romero, RPH since 01/22/2021 12:00 AM     Problem: htn, hld   Priority: High  Onset Date: 07/23/2020     Goal: Disease Management   Start Date: 07/23/2020  Expected End Date: 07/23/2021  Recent Progress: On track  Priority: High  Note:    Current Barriers:  Does not maintain contact with provider  office   Pharmacist Clinical Goal(s):  Patient will  schedule follow-up fasting blood work and chronic appointment.  through collaboration with PharmD and provider.   Interventions: 1:1 collaboration with Carla Brome, MD regarding development and update of comprehensive plan of care as evidenced by provider attestation and co-signature Inter-disciplinary care team collaboration (see longitudinal plan of care) Comprehensive medication review performed; medication list updated in electronic medical record  Hypertension (BP goal <130/80) BP Readings from Last 3 Encounters:  11/23/20 138/64  10/11/20 132/80  09/11/20 137/75  -Controlled -Current treatment: valsartan 320 mg daily  Amlodipine 10 mg daily  -Medications previously tried: ace inhibitors  -Current home readings:  April 2022: 133/63, 132/65, 129/57, 126/56 September 2022: Patient stopped testing BP at home -Current dietary habits: drinking Boost daily. Has had a hard time finding boost for women.  -Current exercise habits: line dances, walking and completing exercise.  -Denies hypotensive/hypertensive symptoms -Educated on BP goals and benefits of medications for prevention of heart attack, stroke and kidney damage; Daily salt intake goal < 2300 mg; Exercise goal of 150 minutes per week; Importance of home blood pressure monitoring; -Counseled to monitor BP at home daily, document, and provide log at future appointments -Counseled on diet and exercise extensively September 2022: Recommended start testing BP 1x/week  Hyperlipidemia: (LDL goal < 100) Lab Results  Component Value Date   CHOL 177 11/23/2020   CHOL 200 (H) 08/21/2020   CHOL 207 (H) 03/21/2020   Lab Results  Component Value Date   HDL 52 11/23/2020   HDL 58 08/21/2020   HDL 54 03/21/2020   Lab Results  Component Value Date   LDLCALC 106 (H) 11/23/2020   LDLCALC 121 (H) 08/21/2020   LDLCALC 132 (H) 03/21/2020   Lab Results  Component Value Date    TRIG 103 11/23/2020   TRIG 117 08/21/2020   TRIG 117 03/21/2020   Lab Results  Component Value Date   CHOLHDL 3.4 11/23/2020   CHOLHDL 3.4 08/21/2020   CHOLHDL 3.8 03/21/2020  No results found for: LDLDIRECT -Not ideally controlled -Current treatment: Zetia 10 mg daily at bedtime  Crestor 5 mg three times weekly -Medications previously tried: simvastatin   -Current dietary patterns: drinks 1 boost daily. Eats quick meals otherwise.  -Current exercise habits: line dancing, walking and exercises.  -Educated on Cholesterol goals;  Benefits of statin for ASCVD risk reduction; Importance of limiting foods high in cholesterol; Exercise goal of 150 minutes per week; -Counseled on diet and exercise extensively September 2022: Patient stopped Rosuvastatin 83m 3x/week due to aches and pains. Will ask PCP how to proceed  PVD (Goal: manage bp, cholesterol, anticoagulation) -Not ideally controlled -Current treatment  aspirin ec 81 mg daily  Clopidogrel 75 mg daily  Zetia 10 mg daily at bedtime  Crestor 5 mg three times weekly Amlodipine 10 mg daily  Valsartan 320 mg daily  -Medications previously tried: simvastatin  -Counseled on diet and exercise extensively September 2022: Patient stopped Rosuvastatin 561m3x/week due to aches and pains. Will ask PCP how to proceed   Patient Goals/Self-Care Activities Patient will:  - take medications as prescribed focus on medication adherence by using pill box check blood pressure daily , document, and provide at future appointments target a minimum of 150 minutes of moderate intensity exercise weekly  Follow Up Plan: Telephone follow up appointment with care management team member scheduled for: 01/22/2021      Medication Assistance: None required.  Patient affirms current coverage meets needs.  Star Rating Drugs Medication Name      Last  Fill          Days supply Rosuvastatin 5 mg      11-19-2020      70DS Valsartan 320 mg         10-01-2020      90DS   Care Gaps: Last annual wellness visit? 10-11-2020 Hep C screening overdue Shingrix overdue Tdap overdue Covid booster overdue RAF= 0.325%  Patient's preferred pharmacy is:  [facility], [place] [address] Phone: [phone] Fax: [phone]  [facility], [place] [address] Phone: [phone] Fax: [phone]  Uses pill box? Yes Pt endorses good% compliance  We discussed: Current pharmacy is preferred with insurance plan and patient is satisfied with pharmacy services Patient decided to: Continue current medication management strategy  Care Plan and Follow Up Patient Decision:  Patient agrees to Care Plan and Follow-up.  Plan: Telephone follow up appointment with care management team member scheduled for:  March 2023

## 2021-01-24 ENCOUNTER — Other Ambulatory Visit: Payer: Self-pay | Admitting: Family Medicine

## 2021-02-12 ENCOUNTER — Telehealth: Payer: Self-pay

## 2021-02-12 MED ORDER — ROSUVASTATIN CALCIUM 5 MG PO TABS: ORAL_TABLET | ORAL | 0 refills | Status: DC

## 2021-02-12 NOTE — Telephone Encounter (Signed)
-----   Message from Blane Ohara, MD sent at 02/02/2021  7:40 PM EDT ----- Regarding: Son passed. Stopped statin. Eber Jones,  Please ask how she is doing as the pharmacist, Harrold Donath, put in his note that her son passed away 3-4 wks ago.  She had discontinued her statin due to muscle pain when I increased it to three times a week. Recommend pt restart statin twice a week. Recommend pt also takes coenzyme q 10 Daily.  Recommend follow up fasting in February.   Please get a sympathy card for her. kc

## 2021-02-12 NOTE — Telephone Encounter (Signed)
Flynn was notified and she has restarted the statin at twice weekly.  She is going to begin co-q-10 with the statin and follow-up as directed.

## 2021-02-18 DIAGNOSIS — E782 Mixed hyperlipidemia: Secondary | ICD-10-CM | POA: Diagnosis not present

## 2021-02-18 DIAGNOSIS — I10 Essential (primary) hypertension: Secondary | ICD-10-CM | POA: Diagnosis not present

## 2021-02-25 ENCOUNTER — Telehealth: Payer: Self-pay

## 2021-02-25 NOTE — Chronic Care Management (AMB) (Signed)
Chronic Care Management Pharmacy Assistant   Name: Carla Romero  MRN: [id number] DOB: Apr 30, 1945   Reason for Encounter: Disease State/ Hyprtension  Recent office visits:  None  Recent consult visits:  None  Hospital visits:  None in previous 6 months  Medications: Outpatient Encounter Medications as of 02/25/2021  Medication Sig   alum & mag hydroxide-simeth (MAALOX/MYLANTA) 200-200-20 MG/5ML suspension Take 30 mLs by mouth 2 (two) times daily as needed for indigestion or heartburn.   amLODipine (NORVASC) 10 MG tablet TAKE 1 TABLET BY MOUTH ONCE DAILY.   Artificial Tear Solution (SOOTHE XP OP) Apply 1 drop to eye daily as needed (dry eyes).   aspirin EC 81 MG tablet Take 81 mg by mouth daily.   B COMPLEX VITAMINS SL Place 1 tablet under the tongue daily.   cholecalciferol (VITAMIN D3) 25 MCG (1000 UNIT) tablet Take 1,000 Units by mouth daily.   clopidogrel (PLAVIX) 75 MG tablet TAKE 1 TABLET BY MOUTH ONCE DAILY.   ezetimibe (ZETIA) 10 MG tablet TAKE 1 TABLET BY MOUTH AT BEDTIME   ibuprofen (ADVIL,MOTRIN) 200 MG tablet Take 400 mg by mouth 2 (two) times daily as needed for headache or moderate pain.   magnesium gluconate (MAGONATE) 500 MG tablet Take 500 mg by mouth daily.   magnesium hydroxide (MILK OF MAGNESIA) 400 MG/5ML suspension Take by mouth daily as needed for mild constipation.   rosuvastatin (CRESTOR) 5 MG tablet Take 1 twice weekly.   tiZANidine (ZANAFLEX) 2 MG tablet Take 1 tablet (2 mg total) by mouth every 6 (six) hours as needed for muscle spasms.   trolamine salicylate (ASPERCREME) 10 % cream Apply 1 application topically as needed for muscle pain.   valsartan (DIOVAN) 320 MG tablet TAKE 1 TABLET BY MOUTH ONCE DAILY   No facility-administered encounter medications on file as of 02/25/2021.    Recent Office Vitals: BP Readings from Last 3 Encounters:  11/23/20 138/64  10/11/20 132/80  09/11/20 137/75   Pulse Readings from Last 3 Encounters:  11/23/20  74  10/11/20 72  09/11/20 (!) 49    Wt Readings from Last 3 Encounters:  11/23/20 123 lb 9.6 oz (56.1 kg)  10/11/20 122 lb 12.8 oz (55.7 kg)  09/11/20 124 lb 1.6 oz (56.3 kg)     Kidney Function Lab Results  Component Value Date/Time   CREATININE 1.10 (H) 11/23/2020 08:00 AM   CREATININE 1.12 (H) 08/21/2020 07:55 AM   CREATININE 1.00 01/30/2014 09:45 AM   GFRNONAA 51 (L) 03/21/2020 08:16 AM   GFRAA 59 (L) 03/21/2020 08:16 AM    BMP Latest Ref Rng & Units 11/23/2020 08/21/2020 03/21/2020  Glucose 65 - 99 mg/dL 86 85 82  BUN 8 - 27 mg/dL 12 11 9   Creatinine 0.57 - 1.00 mg/dL ) 2.09(O) 7.09(G)  BUN/Creat Ratio 12 - 28 11(L) 10(L) 8(L)  Sodium 134 - 144 mmol/L 139 143 142  Potassium 3.5 - 5.2 mmol/L 4.3 4.4 4.5  Chloride 96 - 106 mmol/L 105 106 106  CO2 20 - 29 mmol/L 20 20 20   Calcium 8.7 - 10.3 mg/dL 9.1 8.8 9.5     Current antihypertensive regimen:  valsartan 320 mg daily  Amlodipine 10 mg daily   Patient verbally confirms she is taking the above medications as directed. Yes  How often are you checking your Blood Pressure? infrequently  she checks her blood pressure in the middle of the day after taking her medication.  Current home BP readings: 143/68 Wrist or  arm cuff: arm Caffeine intake: Patient states she drinks coke, pepsi, or tea once in Salt intake: Patient states limited OTC medications including pseudoephedrine or NSAIDs? Patient states centrum multivitamin and COQ10.  Any readings above 180/120? No  What recent interventions/DTPs have been made by any provider to improve Blood Pressure control since last CPP Visit:  Educated on BP goals and benefits of medications for prevention of heart attack, stroke and kidney damage; Daily salt intake goal < 2300 mg; Exercise goal of 150 minutes per week; Importance of home blood pressure monitoring  Any recent hospitalizations or ED visits since last visit with CPP? No  What diet changes have been made to  improve Blood Pressure Control?  Patient states she limits her salt intake.  What exercise is being done to improve your Blood Pressure Control?  Patient states she line dances and walks during the week  Adherence Review: Is the patient currently on ACE/ARB medication? Yes Does the patient have >5 day gap between last estimated fill dates? CPP to review  NOTES: Sent message to Lauren to schedule follow up with Dr. Sedalia Muta  Care Gaps: Last annual wellness visit? 10-11-2020 Hep C screening overdue Shingrix overdue Tdap overdue Covid booster overdue Flu vaccine overdue  Star Rating Drugs: Valsartan 320 mg- Last filled 12-26-2020 90 DS Rosuvastatin 5 mg- Last filled 02-12-2021 30 DS  [facility] CMA Clinical Pharmacist Assistant [phone]

## 2021-02-27 NOTE — Telephone Encounter (Signed)
BP was high with CMA, I called patient to see if it's still been high. Patient said she hasn't tested since and, "143 isn't that bad for me." I asked if she had taken meds this morning and she affirmed. I asked if she'd mind testing right now, she's at 150/66. Patient assured me that this was fine and there was nothing to worry about. I asked patient if she'd mind testing QD for the next week and scheduled f/u for 03/07/21

## 2021-03-07 ENCOUNTER — Telehealth: Payer: PPO

## 2021-03-23 ENCOUNTER — Other Ambulatory Visit: Payer: Self-pay | Admitting: Legal Medicine

## 2021-03-28 ENCOUNTER — Telehealth: Payer: Self-pay

## 2021-03-28 NOTE — Chronic Care Management (AMB) (Signed)
Chronic Care Management Pharmacy Assistant   Name: Carla Romero  MRN: [id number] DOB: May 25, 1945  Reason for Encounter: Disease State/ Hypertension   Recent office visits:  None  Recent consult visits:  None  Hospital visits:  None in previous 6 months  Medications: Outpatient Encounter Medications as of 03/28/2021  Medication Sig   alum & mag hydroxide-simeth (MAALOX/MYLANTA) 200-200-20 MG/5ML suspension Take 30 mLs by mouth 2 (two) times daily as needed for indigestion or heartburn.   amLODipine (NORVASC) 10 MG tablet TAKE 1 TABLET BY MOUTH ONCE DAILY.   Artificial Tear Solution (SOOTHE XP OP) Apply 1 drop to eye daily as needed (dry eyes).   aspirin EC 81 MG tablet Take 81 mg by mouth daily.   B COMPLEX VITAMINS SL Place 1 tablet under the tongue daily.   cholecalciferol (VITAMIN D3) 25 MCG (1000 UNIT) tablet Take 1,000 Units by mouth daily.   clopidogrel (PLAVIX) 75 MG tablet TAKE 1 TABLET BY MOUTH ONCE DAILY.   ezetimibe (ZETIA) 10 MG tablet TAKE 1 TABLET BY MOUTH AT BEDTIME   ibuprofen (ADVIL,MOTRIN) 200 MG tablet Take 400 mg by mouth 2 (two) times daily as needed for headache or moderate pain.   magnesium gluconate (MAGONATE) 500 MG tablet Take 500 mg by mouth daily.   magnesium hydroxide (MILK OF MAGNESIA) 400 MG/5ML suspension Take by mouth daily as needed for mild constipation.   rosuvastatin (CRESTOR) 5 MG tablet Take 1 twice weekly.   tiZANidine (ZANAFLEX) 2 MG tablet Take 1 tablet (2 mg total) by mouth every 6 (six) hours as needed for muscle spasms.   trolamine salicylate (ASPERCREME) 10 % cream Apply 1 application topically as needed for muscle pain.   valsartan (DIOVAN) 320 MG tablet TAKE 1 TABLET BY MOUTH ONCE DAILY   No facility-administered encounter medications on file as of 03/28/2021.    Recent Office Vitals: BP Readings from Last 3 Encounters:  11/23/20 138/64  10/11/20 132/80  09/11/20 137/75   Pulse Readings from Last 3 Encounters:  11/23/20  74  10/11/20 72  09/11/20 (!) 49    Wt Readings from Last 3 Encounters:  11/23/20 123 lb 9.6 oz (56.1 kg)  10/11/20 122 lb 12.8 oz (55.7 kg)  09/11/20 124 lb 1.6 oz (56.3 kg)     Kidney Function Lab Results  Component Value Date/Time   CREATININE 1.10 (H) 11/23/2020 08:00 AM   CREATININE 1.12 (H) 08/21/2020 07:55 AM   CREATININE 1.00 01/30/2014 09:45 AM   GFRNONAA 51 (L) 03/21/2020 08:16 AM   GFRAA 59 (L) 03/21/2020 08:16 AM    BMP Latest Ref Rng & Units 11/23/2020 08/21/2020 03/21/2020  Glucose 65 - 99 mg/dL 86 85 82  BUN 8 - 27 mg/dL 12 11 9   Creatinine 0.57 - 1.00 mg/dL ) 7.62(G) 3.15(V)  BUN/Creat Ratio 12 - 28 11(L) 10(L) 8(L)  Sodium 134 - 144 mmol/L 139 143 142  Potassium 3.5 - 5.2 mmol/L 4.3 4.4 4.5  Chloride 96 - 106 mmol/L 105 106 106  CO2 20 - 29 mmol/L 20 20 20   Calcium 8.7 - 10.3 mg/dL 9.1 8.8 9.5     Current antihypertensive regimen:  valsartan 320 mg daily  Amlodipine 10 mg daily   Patient verbally confirms she is taking the above medications as directed. Yes  How often are you checking your Blood Pressure? twice daily  she checks her blood pressure in the morning before taking her medication.  Current home BP readings: 11-13 143/68, 11-14 142/59, 149/69,  11-15 145/64, 128/67. 11-16 138/64, 140/72. 11-17 134/69, 137/63. 11-19 140/62. 11-20 119/61. 11-21 147/68. 11-22 143/69. 11-28 136/67. 11-29 132/63. 11-30 137/64. 12-02 132/60. 12-05 135/66. 12-07 148/64. Wrist or arm cuff: arm Caffeine intake: Patient states she drinks coke, pepsi, or tea once in a while Salt intake: Patient states limited OTC medications including pseudoephedrine or NSAIDs? Patient states centrum multivitamin and COQ10.  Any readings above 180/120? No  What recent interventions/DTPs have been made by any provider to improve Blood Pressure control since last CPP Visit:  Educated on BP goals and benefits of medications for prevention of heart attack, stroke and kidney  damage; Daily salt intake goal < 2300 mg; Exercise goal of 150 minutes per week; Importance of home blood pressure monitoring  Any recent hospitalizations or ED visits since last visit with CPP? No  What diet changes have been made to improve Blood Pressure Control?  Patient states she limits her salt intake.  What exercise is being done to improve your Blood Pressure Control?  Patient states she line dances and walks during the week. Patient stated she hurt her back so she hasn't been doing much line dancing. Patient stated she was helping her dog get in the car and bent over wrong instead of bending her knees. Patient stated she is feeling better and taking things easy.  Adherence Review: Is the patient currently on ACE/ARB medication? Yes Does the patient have >5 day gap between last estimated fill dates? CPP to review  03-28-2021: Messaged Lauren again to schedule follow up with Dr. Sedalia Muta.  Care Gaps: Last annual wellness visit? 10-11-2020 Shingrix overdue Tdap overdue Covid booster overdue Flu vaccine overdue Hep C screening overdue  Star Rating Drugs: Valsartan 320 mg- Last filled 03-25-2021 90 DS Rosuvastatin 5 mg- Last filled 02-12-2021 84 DS  Malecca [facility] CMA Clinical Pharmacist Assistant [phone]

## 2021-03-29 NOTE — Telephone Encounter (Signed)
BP readings elevated, will let PCP know ASAP and defer to them

## 2021-04-01 ENCOUNTER — Other Ambulatory Visit: Payer: Self-pay

## 2021-04-01 MED ORDER — HYDROCHLOROTHIAZIDE 12.5 MG PO CAPS: 12.5000 mg | ORAL_CAPSULE | Freq: Every day | ORAL | 0 refills | Status: DC

## 2021-04-01 NOTE — Telephone Encounter (Signed)
Patient made aware, verbalized understanding. Patient did stated she was concerned about adding HCTZ because she was already taking valsartan and amlodipine, feels like her headaches may be coming from taking valsartan because she read it could cause headaches. I explain to patient about her readings running high and if she still having headaches or if her pressure drop to low to call office and make provider aware.

## 2021-04-19 ENCOUNTER — Other Ambulatory Visit: Payer: Self-pay | Admitting: Family Medicine

## 2021-04-23 ENCOUNTER — Other Ambulatory Visit: Payer: Self-pay | Admitting: Family Medicine

## 2021-04-30 ENCOUNTER — Telehealth: Payer: Self-pay

## 2021-04-30 NOTE — Chronic Care Management (AMB) (Signed)
Chronic Care Management Pharmacy Assistant   Name: Carla Romero  MRN: [id number] DOB: [birth year]  Reason for Encounter: Disease State/ Hypertension  Recent office visits:  None  Recent consult visits:  None  Hospital visits:  None in previous 6 months  Medications: Outpatient Encounter Medications as of 04/30/2021  Medication Sig   alum & mag hydroxide-simeth (MAALOX/MYLANTA) 200-200-20 MG/5ML suspension Take 30 mLs by mouth 2 (two) times daily as needed for indigestion or heartburn.   amLODipine (NORVASC) 10 MG tablet TAKE 1 TABLET BY MOUTH ONCE DAILY.   Artificial Tear Solution (SOOTHE XP OP) Apply 1 drop to eye daily as needed (dry eyes).   aspirin EC 81 MG tablet Take 81 mg by mouth daily.   B COMPLEX VITAMINS SL Place 1 tablet under the tongue daily.   cholecalciferol (VITAMIN D3) 25 MCG (1000 UNIT) tablet Take 1,000 Units by mouth daily.   clopidogrel (PLAVIX) 75 MG tablet TAKE 1 TABLET BY MOUTH ONCE DAILY.   ezetimibe (ZETIA) 10 MG tablet TAKE 1 TABLET BY MOUTH AT BEDTIME   hydrochlorothiazide (MICROZIDE) 12.5 MG capsule Take 1 capsule (12.5 mg total) by mouth daily.   ibuprofen (ADVIL,MOTRIN) 200 MG tablet Take 400 mg by mouth 2 (two) times daily as needed for headache or moderate pain.   magnesium gluconate (MAGONATE) 500 MG tablet Take 500 mg by mouth daily.   magnesium hydroxide (MILK OF MAGNESIA) 400 MG/5ML suspension Take by mouth daily as needed for mild constipation.   rosuvastatin (CRESTOR) 5 MG tablet TAKE 1 TABLET BY MOUTH TWICE WEEKLY   tiZANidine (ZANAFLEX) 2 MG tablet Take 1 tablet (2 mg total) by mouth every 6 (six) hours as needed for muscle spasms.   trolamine salicylate (ASPERCREME) 10 % cream Apply 1 application topically as needed for muscle pain.   valsartan (DIOVAN) 320 MG tablet TAKE 1 TABLET BY MOUTH ONCE DAILY   No facility-administered encounter medications on file as of 04/30/2021.    Recent Office Vitals: BP Readings from Last 3  Encounters:  11/23/20 138/64  10/11/20 132/80  09/11/20 137/75   Pulse Readings from Last 3 Encounters:  11/23/20 74  10/11/20 72  09/11/20 (!) 49    Wt Readings from Last 3 Encounters:  11/23/20 123 lb 9.6 oz (56.1 kg)  10/11/20 122 lb 12.8 oz (55.7 kg)  09/11/20 124 lb 1.6 oz (56.3 kg)     Kidney Function Lab Results  Component Value Date/Time   CREATININE 1.10 (H) 11/23/2020 08:00 AM   CREATININE 1.12 (H) 08/21/2020 07:55 AM   CREATININE 1.00 01/30/2014 09:45 AM   GFRNONAA 51 (L) 03/21/2020 08:16 AM   GFRAA 59 (L) 03/21/2020 08:16 AM    BMP Latest Ref Rng & Units 11/23/2020 08/21/2020 03/21/2020  Glucose 65 - 99 mg/dL 86 85 82  BUN 8 - 27 mg/dL 12 11 9   Creatinine 0.57 - 1.00 mg/dL ) 9.62(X) 5.28(U)  BUN/Creat Ratio 12 - 28 11(L) 10(L) 8(L)  Sodium 134 - 144 mmol/L 139 143 142  Potassium 3.5 - 5.2 mmol/L 4.3 4.4 4.5  Chloride 96 - 106 mmol/L 105 106 106  CO2 20 - 29 mmol/L 20 20 20   Calcium 8.7 - 10.3 mg/dL 9.1 8.8 9.5     Current antihypertensive regimen:  valsartan 320 mg daily  Amlodipine 10 mg daily HCTZ 12.5 mg daily  Patient verbally confirms she is taking the above medications as directed. Yes  How often are you checking your Blood Pressure? daily  she checks  her blood pressure in the morning before taking her medication.  Current home BP readings: 12-31 118/60, 01-02 127/57, 01-03 127/58, 01-04 135/61, 01-07 130/65, 01-08 129/63, 01-10 140/62, 01-12 127/63 Wrist or arm cuff: arm Caffeine intake: Patient states she drinks coke, pepsi, or tea once in a while Salt intake: Patient states limited OTC medications including pseudoephedrine or NSAIDs?  Patient states centrum multivitamin and COQ10.  Any readings above 180/120? No  What recent interventions/DTPs have been made by any provider to improve Blood Pressure control since last CPP Visit:  Educated on BP goals and benefits of medications for prevention of heart attack, stroke and kidney  damage; Daily salt intake goal < 2300 mg; Exercise goal of 150 minutes per week; Importance of home blood pressure monitoring; -Counseled to monitor BP at home daily, document, and provide log at future appointments -Counseled on diet and exercise extensively  Any recent hospitalizations or ED visits since last visit with CPP? No  What diet changes have been made to improve Blood Pressure Control?  Patient states she limits her salt intake and drinks plenty of water daily.  What exercise is being done to improve your Blood Pressure Control?  Patient states she line dances and walks during the week.  Adherence Review: Is the patient currently on ACE/ARB medication? Yes Does the patient have >5 day gap between last estimated fill dates? CPP to review  05-03-2021: Messaged Lauren again to schedule follow up with Dr. Sedalia Muta.  Care Gaps: Last annual wellness visit? 10-11-2020 Shingrix overdue Tdap overdue Covid booster overdue Flu vaccine overdue Hep C screening overdue  Star Rating Drugs: Valsartan 320 mg- Last filled 03-25-2021 90 DS Rosuvastatin 5 mg- Last filled 02-12-2021 84 DS  Malecca [facility] CMA Clinical Pharmacist Assistant [phone]

## 2021-05-22 DIAGNOSIS — M810 Age-related osteoporosis without current pathological fracture: Secondary | ICD-10-CM | POA: Insufficient documentation

## 2021-05-22 NOTE — Progress Notes (Signed)
Subjective:  Patient ID: Carla Romero, female    DOB: 11/06/1945  Age: 76 y.o. MRN: [id number]  Chief Complaint  Patient presents with   Hypertension   Hyperlipidemia    HPI Hypertension: Patient is taking amlodipine 10 mg daily, aspirin 81 mg daily, hydrochlorothiazide 12.5 mg daily, valsartan 320 mg daily. Patient checks blood pressure every day. 120s/70s.   Hyperlipidemia: She is taking rosuvastatin 5 mg daily, Zetia 10 mg daily.  PVD: Takes plavix 75 mg daily.  Osteoporosis: She takes vitamin D3 25 mcg daily.  Tobacco: unable to quit smoking.   Current Outpatient Medications on File Prior to Visit  Medication Sig Dispense Refill   alum & mag hydroxide-simeth (MAALOX/MYLANTA) 200-200-20 MG/5ML suspension Take 30 mLs by mouth 2 (two) times daily as needed for indigestion or heartburn.     amLODipine (NORVASC) 10 MG tablet TAKE 1 TABLET BY MOUTH ONCE DAILY. 90 tablet 0   Artificial Tear Solution (SOOTHE XP OP) Apply 1 drop to eye daily as needed (dry eyes).     aspirin EC 81 MG tablet Take 81 mg by mouth daily.     B COMPLEX VITAMINS SL Place 1 tablet under the tongue daily.     cholecalciferol (VITAMIN D3) 25 MCG (1000 UNIT) tablet Take 1,000 Units by mouth daily.     clopidogrel (PLAVIX) 75 MG tablet TAKE 1 TABLET BY MOUTH ONCE DAILY. 90 tablet 3   ezetimibe (ZETIA) 10 MG tablet TAKE 1 TABLET BY MOUTH AT BEDTIME 90 tablet 3   hydrochlorothiazide (MICROZIDE) 12.5 MG capsule Take 1 capsule (12.5 mg total) by mouth daily. 90 capsule 0   ibuprofen (ADVIL,MOTRIN) 200 MG tablet Take 400 mg by mouth 2 (two) times daily as needed for headache or moderate pain.     magnesium gluconate (MAGONATE) 500 MG tablet Take 500 mg by mouth daily.     magnesium hydroxide (MILK OF MAGNESIA) 400 MG/5ML suspension Take by mouth daily as needed for mild constipation.     rosuvastatin (CRESTOR) 5 MG tablet TAKE 1 TABLET BY MOUTH TWICE WEEKLY 90 tablet 0   tiZANidine (ZANAFLEX) 2 MG tablet Take 1  tablet (2 mg total) by mouth every 6 (six) hours as needed for muscle spasms. 60 tablet 0   trolamine salicylate (ASPERCREME) 10 % cream Apply 1 application topically as needed for muscle pain.     valsartan (DIOVAN) 320 MG tablet TAKE 1 TABLET BY MOUTH ONCE DAILY 90 tablet 1   No current facility-administered medications on file prior to visit.   Past Medical History:  Diagnosis Date   Acute viral syndrome    Anemia    Vit D deficiency   Arteritis, unspecified (HCC) November 01, 2013   Arthritis    "fingers, knees, upper spine" (06/24/2016)   B12 deficiency anemia    "had injections in the 1970s"   CAD (coronary artery disease)    COPD (chronic obstructive pulmonary disease) (HCC)    "no one has ever told me I have this" (06/24/2016)   Diverticulitis    GERD (gastroesophageal reflux disease)    Hyperlipidemia    Hypertension    Osteoporosis    Pain October 28, 2013   Left Lower quadrant / back   Thrombocytopenia [facility](HCC)    Past Surgical History:  Procedure Laterality Date   ABDOMINAL AORTOGRAM W/LOWER EXTREMITY N/A 06/24/2016   Procedure: Abdominal Aortogram w/Lower Extremity;  Surgeon: Nada LibmanVance W Brabham, MD;  Location: [facility] INVASIVE CV LAB;  Service: Cardiovascular;  Laterality: N/A;  ABDOMINAL HYSTERECTOMY     APPENDECTOMY     COLONOSCOPY  Aug. 4, 2014   FEMORAL ARTERY STENT Left 2018   [employer] 6 mm x 80 mm x 130 cm. Dr Myra Gianotti [phone]   TUBAL LIGATION      Family History  Problem Relation Age of Onset   Hypertension Mother    Hyperlipidemia Mother    Hypertension Sister    Social History   Socioeconomic History   Marital status: Widowed    Spouse name: Not on file   Number of children: 2   Years of education: Not on file   Highest education level: Not on file  Occupational History   Not on file  Tobacco Use   Smoking status: Every Day    Packs/day: 0.12    Years: 43.00    Pack years: 5.16    Types: Cigarettes   Smokeless tobacco: Never   Tobacco  comments:    5-7 cigarettes per day  Vaping Use   Vaping Use: Never used  Substance and Sexual Activity   Alcohol use: Yes    Alcohol/week: 2.0 - 3.0 standard drinks    Types: 2 - 3 Standard drinks or equivalent per week    Comment: 06/24/2016 "might have a few drinks/year"   Drug use: No   Sexual activity: Not Currently    Birth control/protection: Post-menopausal  Other Topics Concern   Not on file  Social History Narrative   Retired.    Reads books a lot.    Goes to [employer].    Line dancing on Wednesday.    Has 2 children:   Her son passed at age 16 yo this past year.    Her son, Clide Cliff, lives close to her.    Social Determinants of Health   Financial Resource Strain: Low Risk    Difficulty of Paying Living Expenses: Not hard at all  Food Insecurity: No Food Insecurity   Worried About Programme researcher, broadcasting/film/video in the Last Year: Never true   Ran Out of Food in the Last Year: Never true  Transportation Needs: No Transportation Needs   Lack of Transportation (Medical): No   Lack of Transportation (Non-Medical): No  Physical Activity: Not on file  Stress: Not on file  Social Connections: Not on file    Review of Systems  Constitutional:  Negative for chills, fatigue and fever.  HENT:  Negative for congestion, rhinorrhea and sore throat.   Respiratory:  Negative for cough and shortness of breath.   Cardiovascular:  Negative for chest pain.  Gastrointestinal:  Negative for abdominal pain, constipation, diarrhea, nausea and vomiting.  Genitourinary:  Negative for dysuria and urgency.  Musculoskeletal:  Positive for back pain and myalgias.  Neurological:  Positive for headaches (Mild. resolve frequently on their own.). Negative for dizziness, weakness and light-headedness.  Psychiatric/Behavioral:  Negative for dysphoric mood (Son died in 26-Jan-2023.). The patient is not nervous/anxious.     Objective:  BP 124/70    Pulse 60    Temp (!) 97 F (36.1 C)    Resp 16    Ht 5\' 4"  (1.626  m)    Wt 121 lb 3.2 oz (55 kg)    BMI 20.80 kg/m   BP/Weight 05/23/2021 11/23/2020 10/11/2020  Systolic BP 124 138 132  Diastolic BP 70 64 80  Wt. (Lbs) 121.2 123.6 122.8  BMI 20.8 24.14 23.98    Physical Exam Vitals reviewed.  Constitutional:      Appearance: Normal  appearance. She is normal weight.  Neck:     Vascular: No carotid bruit.  Cardiovascular:     Rate and Rhythm: Normal rate and regular rhythm.     Heart sounds: Normal heart sounds.  Pulmonary:     Effort: Pulmonary effort is normal. No respiratory distress.     Breath sounds: Normal breath sounds.  Abdominal:     General: Abdomen is flat. Bowel sounds are normal.     Palpations: Abdomen is soft.     Tenderness: There is no abdominal tenderness.  Neurological:     Mental Status: She is alert and oriented to person, place, and time.  Psychiatric:        Mood and Affect: Mood normal.        Behavior: Behavior normal.    Diabetic Foot Exam - Simple   No data filed      Lab Results  Component Value Date   WBC 7.9 11/23/2020   HGB 13.6 11/23/2020   HCT 40.0 11/23/2020   PLT 262 11/23/2020   GLUCOSE 86 11/23/2020   CHOL 177 11/23/2020   TRIG 103 11/23/2020   HDL 52 11/23/2020   LDLCALC 106 (H) 11/23/2020   ALT 12 11/23/2020   AST 23 11/23/2020   NA 139 11/23/2020   K 4.3 11/23/2020   CL 105 11/23/2020   CREATININE 1.10 (H) 11/23/2020   BUN 12 11/23/2020   CO2 20 11/23/2020   TSH 2.750 11/23/2020      Assessment & Plan:   Problem List Items Addressed This Visit       Cardiovascular and Mediastinum   Essential hypertension    Well controlled.  No changes to medicines.  Continue to work on eating a healthy diet and exercise.  Labs drawn today.        Relevant Orders   Comprehensive metabolic panel   CBC with Differential/Platelet   PVD (peripheral vascular disease) (HCC)    The current medical regimen is effective;  continue present plan and medications. Continue plavix 75 mg daily          Nervous and Auditory   Cigarette nicotine dependence with nicotine-induced disorder    Recommend cessation.        Musculoskeletal and Integument   Age-related osteoporosis without current pathological fracture    Strongly recommend get DEXA for further treatment.       Relevant Orders   VITAMIN D 25 Hydroxy (Vit-D Deficiency, Fractures)     Other   Mixed hyperlipidemia - Primary    Well controlled.  No changes to medicines.  Continue to work on eating a healthy diet and exercise.  Labs drawn today.        Relevant Orders   Lipid panel  . Orders Placed This Encounter  Procedures   Comprehensive metabolic panel   Lipid panel   CBC with Differential/Platelet   VITAMIN D 25 Hydroxy (Vit-D Deficiency, Fractures)     Follow-up: Return for awv with KIM anytime.  chronic fasting in 6 months. .  An After Visit Summary was printed and given to the patient.  Blane Ohara, MD [facility] [phone]

## 2021-05-23 ENCOUNTER — Ambulatory Visit (INDEPENDENT_AMBULATORY_CARE_PROVIDER_SITE_OTHER): Payer: PPO | Admitting: Family Medicine

## 2021-05-23 ENCOUNTER — Other Ambulatory Visit: Payer: Self-pay

## 2021-05-23 ENCOUNTER — Encounter: Payer: Self-pay | Admitting: Family Medicine

## 2021-05-23 VITALS — BP 124/70 | HR 60 | Temp 97.0°F | Resp 16 | Ht 64.0 in | Wt 121.2 lb

## 2021-05-23 DIAGNOSIS — M810 Age-related osteoporosis without current pathological fracture: Secondary | ICD-10-CM | POA: Diagnosis not present

## 2021-05-23 DIAGNOSIS — E782 Mixed hyperlipidemia: Secondary | ICD-10-CM | POA: Diagnosis not present

## 2021-05-23 DIAGNOSIS — I739 Peripheral vascular disease, unspecified: Secondary | ICD-10-CM | POA: Diagnosis not present

## 2021-05-23 DIAGNOSIS — F17219 Nicotine dependence, cigarettes, with unspecified nicotine-induced disorders: Secondary | ICD-10-CM

## 2021-05-23 DIAGNOSIS — I10 Essential (primary) hypertension: Secondary | ICD-10-CM

## 2021-05-23 NOTE — Assessment & Plan Note (Signed)
Well controlled.  ?No changes to medicines.  ?Continue to work on eating a healthy diet and exercise.  ?Labs drawn today.  ?

## 2021-05-23 NOTE — Patient Instructions (Addendum)
I would strongly recommend bone density.  Recommend calcium 1200-1500 mg

## 2021-05-23 NOTE — Assessment & Plan Note (Signed)
Strongly recommend get DEXA for further treatment.

## 2021-05-23 NOTE — Assessment & Plan Note (Signed)
The current medical regimen is effective;  continue present plan and medications. Continue plavix 75 mg daily

## 2021-05-23 NOTE — Assessment & Plan Note (Signed)
Recommend cessation. ?

## 2021-05-24 LAB — CBC WITH DIFFERENTIAL/PLATELET
Basophils Absolute: 0.1 10*3/uL (ref 0.0–0.2)
Basos: 1 %
EOS (ABSOLUTE): 0.1 10*3/uL (ref 0.0–0.4)
Eos: 1 %
Hematocrit: 38.1 % (ref 34.0–46.6)
Hemoglobin: 13.1 g/dL (ref 11.1–15.9)
Immature Grans (Abs): 0 10*3/uL (ref 0.0–0.1)
Immature Granulocytes: 0 %
Lymphocytes Absolute: 2.1 10*3/uL (ref 0.7–3.1)
Lymphs: 27 %
MCH: 30.2 pg (ref 26.6–33.0)
MCHC: 34.4 g/dL (ref 31.5–35.7)
MCV: 88 fL (ref 79–97)
Monocytes Absolute: 0.5 10*3/uL (ref 0.1–0.9)
Monocytes: 6 %
Neutrophils Absolute: 5.1 10*3/uL (ref 1.4–7.0)
Neutrophils: 65 %
Platelets: 269 10*3/uL (ref 150–450)
RBC: 4.34 x10E6/uL (ref 3.77–5.28)
RDW: 12.2 % (ref 11.7–15.4)
WBC: 7.9 10*3/uL (ref 3.4–10.8)

## 2021-05-24 LAB — COMPREHENSIVE METABOLIC PANEL
ALT: 13 IU/L (ref 0–32)
AST: 24 IU/L (ref 0–40)
Albumin/Globulin Ratio: 2.2 (ref 1.2–2.2)
Albumin: 4.7 g/dL (ref 3.7–4.7)
Alkaline Phosphatase: 103 IU/L (ref 44–121)
BUN/Creatinine Ratio: 11 — ABNORMAL LOW (ref 12–28)
BUN: 11 mg/dL (ref 8–27)
Bilirubin Total: 0.2 mg/dL (ref 0.0–1.2)
CO2: 22 mmol/L (ref 20–29)
Calcium: 9.6 mg/dL (ref 8.7–10.3)
Chloride: 91 mmol/L — ABNORMAL LOW (ref 96–106)
Creatinine, Ser: 1.02 mg/dL — ABNORMAL HIGH (ref 0.57–1.00)
Globulin, Total: 2.1 g/dL (ref 1.5–4.5)
Glucose: 90 mg/dL (ref 70–99)
Potassium: 4.2 mmol/L (ref 3.5–5.2)
Sodium: 129 mmol/L — ABNORMAL LOW (ref 134–144)
Total Protein: 6.8 g/dL (ref 6.0–8.5)
eGFR: 57 mL/min/{1.73_m2} — ABNORMAL LOW (ref >59)

## 2021-05-24 LAB — LIPID PANEL
Chol/HDL Ratio: 2.5 ratio (ref 0.0–4.4)
Cholesterol, Total: 165 mg/dL (ref 100–199)
HDL: 66 mg/dL (ref >39)
LDL Chol Calc (NIH): 85 mg/dL (ref 0–99)
Triglycerides: 71 mg/dL (ref 0–149)
VLDL Cholesterol Cal: 14 mg/dL (ref 5–40)

## 2021-05-24 LAB — CARDIOVASCULAR RISK ASSESSMENT

## 2021-05-24 LAB — VITAMIN D 25 HYDROXY (VIT D DEFICIENCY, FRACTURES): Vit D, 25-Hydroxy: 39.8 ng/mL (ref 30.0–100.0)

## 2021-06-13 ENCOUNTER — Telehealth: Payer: Self-pay

## 2021-06-13 NOTE — Chronic Care Management (AMB) (Signed)
Chronic Care Management Pharmacy Assistant   Name: Carla Romero  Reason for Encounter: Disease State/ Hypertension  Recent office visits:  05-23-2021 Carla Brome, MD. Creatinine= 1.02, eGFR= 57, Bun/Creatinine= 11, Sodium= 129, Chloride= 91.  Recent consult visits:  None  Hospital visits:  None in previous 6 months  Medications: Outpatient Encounter Medications as of 06/13/2021  Medication Sig   alum & mag hydroxide-simeth (MAALOX/MYLANTA) 200-200-20 MG/5ML suspension Take 30 mLs by mouth 2 (two) times daily as needed for indigestion or heartburn.   amLODipine (NORVASC) 10 MG tablet TAKE 1 TABLET BY MOUTH ONCE DAILY.   Artificial Tear Solution (SOOTHE XP OP) Apply 1 drop to eye daily as needed (dry eyes).   aspirin EC 81 MG tablet Take 81 mg by mouth daily.   B COMPLEX VITAMINS SL Place 1 tablet under the tongue daily.   cholecalciferol (VITAMIN D3) 25 MCG (1000 UNIT) tablet Take 1,000 Units by mouth daily.   clopidogrel (PLAVIX) 75 MG tablet TAKE 1 TABLET BY MOUTH ONCE DAILY.   ezetimibe (ZETIA) 10 MG tablet TAKE 1 TABLET BY MOUTH AT BEDTIME   hydrochlorothiazide (MICROZIDE) 12.5 MG capsule Take 1 capsule (12.5 mg total) by mouth daily.   ibuprofen (ADVIL,MOTRIN) 200 MG tablet Take 400 mg by mouth 2 (two) times daily as needed for headache or moderate pain.   magnesium gluconate (MAGONATE) 500 MG tablet Take 500 mg by mouth daily.   magnesium hydroxide (MILK OF MAGNESIA) 400 MG/5ML suspension Take by mouth daily as needed for mild constipation.   rosuvastatin (CRESTOR) 5 MG tablet TAKE 1 TABLET BY MOUTH TWICE WEEKLY   tiZANidine (ZANAFLEX) 2 MG tablet Take 1 tablet (2 mg total) by mouth every 6 (six) hours as needed for muscle spasms.   trolamine salicylate (ASPERCREME) 10 % cream Apply 1 application topically as needed for muscle pain.   valsartan (DIOVAN) 320 MG tablet TAKE 1 TABLET BY MOUTH ONCE DAILY   No facility-administered  encounter medications on file as of 06/13/2021.    Recent Office Vitals: BP Readings from Last 3 Encounters:  05/23/21 124/70  11/23/20 138/64  10/11/20 132/80   Pulse Readings from Last 3 Encounters:  05/23/21 60  11/23/20 74  10/11/20 72    Wt Readings from Last 3 Encounters:  05/23/21 121 lb 3.2 oz (55 kg)  11/23/20 123 lb 9.6 oz (56.1 kg)  10/11/20 122 lb 12.8 oz (55.7 kg)     Kidney Function Lab Results  Component Value Date/Time   CREATININE 1.02 (H) 05/23/2021 10:04 AM   CREATININE 1.10 (H) 11/23/2020 08:00 AM   CREATININE 1.00 01/30/2014 09:45 AM   GFRNONAA 51 (L) 03/21/2020 08:16 AM   GFRAA 59 (L) 03/21/2020 08:16 AM    BMP Latest Ref Rng & Units 05/23/2021 11/23/2020 08/21/2020  Glucose 70 - 99 mg/dL 90 86 85  BUN 8 - 27 mg/dL _0 Creatinine 0.57 - 1.00 mg/dL 1.02(H) 1.10(H) 1.12(H)  BUN/Creat Ratio 12 - 28 11(L) 11(L) 10(L)  Sodium 134 - 144 mmol/L 129(L) 139 143  Potassium 3.5 - 5.2 mmol/L 4.2 4.3 4.4  Chloride 96 - 106 mmol/L 91(L) 105 106  CO2 20 - 29 mmol/L _1 Calcium 8.7 - 10.3 mg/dL 9.6 9.1 8.8     Current antihypertensive regimen:  Valsartan 320 mg daily  Amlodipine 10 mg daily  Patient verbally confirms she is taking the above medications as directed. Yes  How often are  you checking your Blood Pressure? daily  she checks her blood pressure in the morning after taking her medication.  Current home BP readings: 128/69, 123/61, 129/65, 120/62 Wrist or arm cuff: arm Caffeine intake: Patient states she drinks coke, pepsi, or decaf tea once in a while Salt intake: Limited OTC medications including pseudoephedrine or NSAIDs?  Any readings above 180/120? No  What recent interventions/DTPs have been made by any provider to improve Blood Pressure control since last CPP Visit:  Educated on BP goals and benefits of medications for prevention of heart attack, stroke and kidney damage; Daily salt intake goal < 2300 mg; Exercise goal of 150  minutes per week; Importance of home blood pressure monitoring; -Counseled to monitor BP at home daily, document, and provide log at future appointments -Counseled on diet and exercise extensively  Any recent hospitalizations or ED visits since last visit with CPP? No  What diet changes have been made to improve Blood Pressure Control?  Patient states she limits her salt intake and drinks plenty of water.  What exercise is being done to improve your Blood Pressure Control?  Patient states she line dances and walks during the week. Patient stated she hasn't been line dancing due to feeling bad but she will start back.  Adherence Review: Is the patient currently on ACE/ARB medication? Yes Does the patient have >5 day gap between last estimated fill dates? CPP to review  Care Gaps: Last annual wellness visit? 10-11-2020 Hep C screening overdue Shingrix overdue Tdap overdue  Star Rating Drugs: Valsartan 320 mg- Last filled 03-25-2021 90 DS Rosuvastatin 5 mg- Last filled 04-11-2021 90 DS  [place] Clinical Pharmacist Assistant [phone]

## 2021-07-04 ENCOUNTER — Telehealth: Payer: Self-pay

## 2021-07-04 NOTE — Progress Notes (Signed)
Pt confirmed appt with CPP on 3/20 ? ?Danielle Gerringer, CMA ?Clinical Pharmacist Assistant  ?[phone]  ?

## 2021-07-08 ENCOUNTER — Ambulatory Visit (INDEPENDENT_AMBULATORY_CARE_PROVIDER_SITE_OTHER): Payer: PPO

## 2021-07-08 NOTE — Patient Instructions (Signed)
Visit Information ? ? Goals Addressed   ?None ?  ? ?Patient Care Plan: CCM Pharmacy Care Plan  ?  ? ?Problem Identified: htn, hld   ?Priority: High  ?Onset Date: 07/23/2020  ?  ? ?Goal: Disease Management   ?Start Date: 07/23/2020  ?Expected End Date: 07/23/2021  ?Recent Progress: On track  ?Priority: High  ?Note:   ? ?Current Barriers:  ?Does not maintain contact with provider office ? ? ?Pharmacist Clinical Goal(s):  ?Patient will schedule follow-up fasting blood work and chronic appointment.  through collaboration with PharmD and provider.  ? ?Interventions: ?1:1 collaboration with Cox, Kirsten, MD regarding development and update of comprehensive plan of care as evidenced by provider attestation and co-signature ?Inter-disciplinary care team collaboration (see longitudinal plan of care) ?Comprehensive medication review performed; medication list updated in electronic medical record ? ?Hypertension (BP goal <130/80) ?BP Readings from Last 3 Encounters:  ?05/23/21 124/70  ?11/23/20 138/64  ?10/11/20 132/80  ?-Controlled ?-Current treatment: ?valsartan 320 mg daily Appropriate, Effective, Safe, Accessible ?Amlodipine 10 mg daily Appropriate, Effective, Safe, Accessible ?HCTZ 12.5mg  Appropriate, Effective, Safe, Accessible ?-Medications previously tried: ace inhibitors  ?-Current home readings:  ?April 2022: 133/63, 132/65, 129/57, 126/56 ?September 2022: Patient stopped testing BP at home ?March 2023: 130/63 ?-Current dietary habits: drinking Boost daily. Has had a hard time finding boost for women.  ?-Current exercise habits: line dances, walking and completing exercise.  ?-Denies hypotensive/hypertensive symptoms ?-Educated on BP goals and benefits of medications for prevention of heart attack, stroke and kidney damage; ?Daily salt intake goal < 2300 mg; ?Exercise goal of 150 minutes per week; ?Importance of home blood pressure monitoring; ?-Counseled to monitor BP at home daily, document, and provide log at future  appointments ?-Counseled on diet and exercise extensively ?September 2022: Recommended start testing BP 1x/week ?March 2023: Patient low risk, will decrease f/u calls ? ?Hyperlipidemia: (LDL goal < 100) ?Lab Results  ?Component Value Date  ? CHOL 165 05/23/2021  ? CHOL 177 11/23/2020  ? CHOL 200 (H) 08/21/2020  ? ?Lab Results  ?Component Value Date  ? HDL 66 05/23/2021  ? HDL 52 11/23/2020  ? HDL 58 08/21/2020  ? ?Lab Results  ?Component Value Date  ? LDLCALC 85 05/23/2021  ? LDLCALC 106 (H) 11/23/2020  ? LDLCALC 121 (H) 08/21/2020  ? ?Lab Results  ?Component Value Date  ? TRIG 71 05/23/2021  ? TRIG 103 11/23/2020  ? TRIG 117 08/21/2020  ? ?Lab Results  ?Component Value Date  ? CHOLHDL 2.5 05/23/2021  ? CHOLHDL 3.4 11/23/2020  ? CHOLHDL 3.4 08/21/2020  ?No results found for: LDLDIRECT ?-Not ideally controlled ?-Current treatment: ?Zetia 10 mg daily at bedtime  Appropriate, Effective, Safe, Accessible ?Crestor 5 mg two times weekly Appropriate, Effective, Safe, Accessible ?-Medications previously tried: simvastatin   ?-Current dietary patterns: drinks 1 boost daily. Eats quick meals otherwise.  ?-Current exercise habits: line dancing, walking and exercises.  ?-Educated on Cholesterol goals;  ?Benefits of statin for ASCVD risk reduction; ?Importance of limiting foods high in cholesterol; ?Exercise goal of 150 minutes per week; ?-Counseled on diet and exercise extensively ?September 2022: Patient stopped Rosuvastatin 5mg  3x/week due to aches and pains. Will ask PCP how to proceed ?March 2023: Patient low risk, will decrease f/u calls ? ?PVD (Goal: manage bp, cholesterol, anticoagulation) ?-Not ideally controlled ?-Current treatment  ?aspirin ec 81 mg daily Appropriate, Effective, Safe, Accessible ?Clopidogrel 75 mg daily Appropriate, Effective, Safe, Accessible ?Zetia 10 mg daily at bedtime Appropriate, Effective, Safe, Accessible ?Crestor  5 mg two times weekly Appropriate, Effective, Safe, Accessible ?-Medications  previously tried: simvastatin  ?-Counseled on diet and exercise extensively ?September 2022: Patient stopped Rosuvastatin 5mg  3x/week due to aches and pains. Will ask PCP how to proceed ? ? ?Patient Goals/Self-Care Activities ?Patient will:  ?- take medications as prescribed ?focus on medication adherence by using pill box ?check blood pressure daily , document, and provide at future appointments ?target a minimum of 150 minutes of moderate intensity exercise weekly ? ?Follow Up Plan: Telephone follow up appointment with care management team member scheduled for: PRN ? ? , Pharm.D. - [phone] ? ?  ? ? ?Ms. Upshur was given information about Chronic Care Management services today including:  ?CCM service includes personalized support from designated clinical staff supervised by her physician, including individualized plan of care and coordination with other care providers ?24/7 contact phone numbers for assistance for urgent and routine care needs. ?Standard insurance, coinsurance, copays and deductibles apply for chronic care management only during months in which we provide at least 20 minutes of these services. Most insurances cover these services at 100%, however patients may be responsible for any copay, coinsurance and/or deductible if applicable. This service may help you avoid the need for more expensive face-to-face services. ?Only one practitioner may furnish and bill the service in a calendar month. ?The patient may stop CCM services at any time (effective at the end of the month) by phone call to the office staff. ? ?Patient agreed to services and verbal consent obtained.  ? ?The patient verbalized understanding of instructions, educational materials, and care plan provided today and declined offer to receive copy of patient instructions, educational materials, and care plan.  ?Patient will reach out PRN ? ?Jewel Baize, [facility] [phone] ? ?

## 2021-07-08 NOTE — Progress Notes (Signed)
? ?Chronic Care Management ?Pharmacy Note ? ?07/08/2021 ?Name:  Carla Romero MRN:  [id number] DOB:  [birth year]  ? ?Plan Updates:  ?None ? ?Subjective: ?Carla Romero is an 76 y.o. year old female who is a primary patient of Cox, Kirsten, MD.  The CCM team was consulted for assistance with disease management and care coordination needs.   ? ?Engaged with patient by telephone for follow up visit in response to provider referral for pharmacy case management and/or care coordination services.  ? ?Consent to Services:  ?The patient was given information about Chronic Care Management services, agreed to services, and gave verbal consent prior to initiation of services.  Please see initial visit note for detailed documentation.  ? ?Patient Care Team: ?CoxElnita Maxwell, MD as PCP - General (Family Medicine) ?Setzer, Edman Circle, PA-C as Physician Assistant (Physician Assistant) ?Lane Hacker, [facility] as Pharmacist (Pharmacist) ? ?Recent office visits:  ?05-23-2021 CoxElnita Maxwell, MD. Creatinine= 1.02, eGFR= 57, Bun/Creatinine= 11, Sodium= 129, Chloride= 91. ?  ?Recent consult visits:  ?None ?  ?Hospital visits:  ?None in previous 6 months ? ?Objective: ? ?Lab Results  ?Component Value Date  ? CREATININE 1.02 (H) 05/23/2021  ? BUN 11 05/23/2021  ? GFRNONAA 51 (L) 03/21/2020  ? GFRAA 59 (L) 03/21/2020  ? NA 129 (L) 05/23/2021  ? K 4.2 05/23/2021  ? CALCIUM 9.6 05/23/2021  ? CO2 22 05/23/2021  ? GLUCOSE 90 05/23/2021  ? ? ?No results found for: HGBA1C, FRUCTOSAMINE, GFR, MICROALBUR  ?Last diabetic Eye exam: No results found for: HMDIABEYEEXA  ?Last diabetic Foot exam: No results found for: HMDIABFOOTEX  ? ?Lab Results  ?Component Value Date  ? CHOL 165 05/23/2021  ? HDL 66 05/23/2021  ? [place] 85 05/23/2021  ? TRIG 71 05/23/2021  ? CHOLHDL 2.5 05/23/2021  ? ? ?Hepatic Function Latest Ref Rng & Units 05/23/2021 11/23/2020 08/21/2020  ?Total Protein 6.0 - 8.5 g/dL 6.8 6.9 6.7  ?Albumin 3.7 - 4.7 g/dL 4.7 4.5 4.3  ?AST 0 - 40 IU/L _0 ?ALT 0 - 32 IU/L _1 ?Alk Phosphatase 44 - 121 IU/L 103 107 96  ?Total Bilirubin 0.0 - 1.2 mg/dL 0.2 0.3 <0.2  ? ? ?Lab Results  ?Component Value Date/Time  ? TSH 2.750 11/23/2020 08:00 AM  ? TSH 2.110 03/21/2020 08:16 AM  ? ? ?CBC Latest Ref Rng & Units 05/23/2021 11/23/2020 08/21/2020  ?WBC 3.4 - 10.8 x10E3/uL 7.9 7.9 8.5  ?Hemoglobin 11.1 - 15.9 g/dL 13.1 13.6 12.9  ?Hematocrit 34.0 - 46.6 % 38.1 40.0 39.2  ?Platelets 150 - 450 x10E3/uL 269 262 234  ? ? ?Lab Results  ?Component Value Date/Time  ? VD25OH 39.8 05/23/2021 10:04 AM  ? ? ?Clinical ASCVD: No  ?The 10-year ASCVD risk score (Arnett DK, et al., 2019) is: 27.5% ?  Values used to calculate the score: ?    Age: 66 years ?    Sex: Female ?    Is Non-Hispanic African American: No ?    Diabetic: No ?    Tobacco smoker: Yes ?    Systolic Blood Pressure: 237 mmHg ?    Is BP treated: Yes ?    HDL Cholesterol: 66 mg/dL ?    Total Cholesterol: 165 mg/dL   ? ?Depression screen [facility] 2/9 11/23/2020 10/11/2020 08/21/2020  ?Decreased Interest 0 0 0  ?Down, Depressed, Hopeless 0 0 0  ?PHQ - 2 Score 0 0 0  ?Altered sleeping - -  0  ?Tired, decreased energy - - 1  ?Change in appetite - - 0  ?Feeling bad or failure about yourself  - - 0  ?Trouble concentrating - - 0  ?Moving slowly or fidgety/restless - - 0  ?Suicidal thoughts - - 0  ?PHQ-9 Score - - 1  ?Difficult doing work/chores - - Not difficult at all  ?  ? ?Social History  ? ?Tobacco Use  ?Smoking Status Every Day  ? Packs/day: 0.12  ? Years: 43.00  ? Pack years: 5.16  ? Types: Cigarettes  ?Smokeless Tobacco Never  ?Tobacco Comments  ? 5-7 cigarettes per day  ? ?BP Readings from Last 3 Encounters:  ?05/23/21 124/70  ?11/23/20 138/64  ?10/11/20 132/80  ? ?Pulse Readings from Last 3 Encounters:  ?05/23/21 60  ?11/23/20 74  ?10/11/20 72  ? ?Wt Readings from Last 3 Encounters:  ?05/23/21 121 lb 3.2 oz (55 kg)  ?11/23/20 123 lb 9.6 oz (56.1 kg)  ?10/11/20 122 lb 12.8 oz (55.7 kg)  ? ?BMI Readings from Last 3 Encounters:   ?05/23/21 20.80 kg/m?  ?11/23/20 24.14 kg/m?  ?10/11/20 23.98 kg/m?  ? ? ?Assessment/Interventions: Review of patient past medical history, allergies, medications, health status, including review of consultants reports, laboratory and other test data, was performed as part of comprehensive evaluation and provision of chronic care management services.  ? ?SDOH:  (Social Determinants of Health) assessments and interventions performed: Yes ?SDOH Interventions   ? ?Flowsheet Row Most Recent Value  ?SDOH Interventions   ?Financial Strain Interventions Intervention Not Indicated  ?Transportation Interventions Intervention Not Indicated  ? ?  ? ?SDOH Screenings  ? ?Alcohol Screen: Not on file  ?Depression (PHQ2-9): Low Risk   ? PHQ-2 Score: 0  ?Financial Resource Strain: Low Risk   ? Difficulty of Paying Living Expenses: Not hard at all  ?Food Insecurity: No Food Insecurity  ? Worried About Charity fundraiser in the Last Year: Never true  ? Ran Out of Food in the Last Year: Never true  ?Housing: Low Risk   ? Last Housing Risk Score: 0  ?Physical Activity: Not on file  ?Social Connections: Not on file  ?Stress: Not on file  ?Tobacco Use: High Risk  ? Smoking Tobacco Use: Every Day  ? Smokeless Tobacco Use: Never  ? Passive Exposure: Not on file  ?Transportation Needs: No Transportation Needs  ? Lack of Transportation (Medical): No  ? Lack of Transportation (Non-Medical): No  ? ? ?CCM Care Plan ? ?Allergies  ?Allergen Reactions  ? Ace Inhibitors Cough  ? Trilipix [Choline Fenofibrate] Other (See Comments)  ?  Muscle cramps, muscle stiffness  ? Zocor [Simvastatin] Other (See Comments)  ?  Myalgia  ? ? ?Medications Reviewed Today   ? ? Reviewed by Lane Hacker, [facility] (Pharmacist) on 07/08/21 at [phone]  Med List Status: <None>  ? ?Medication Order Taking? Sig Documenting Provider Last Dose Status Informant  ?alum & mag hydroxide-simeth (MAALOX/MYLANTA) 200-200-20 MG/5ML suspension [id number] No Take 30 mLs by mouth 2 (two)  times daily as needed for indigestion or heartburn. [provider] Taking Active   ?amLODipine (NORVASC) 10 MG tablet [id number]  TAKE 1 TABLET BY MOUTH ONCE DAILY. [name], MD  Active   ?Artificial Tear Solution (SOOTHE XP OP) [id number] No Apply 1 drop to eye daily as needed (dry eyes). [provider] Taking Active Self  ?aspirin EC 81 MG tablet [id number] No Take 81 mg by mouth daily. [provider] Taking Active Self  ?  B COMPLEX VITAMINS SL [id number] No Place 1 tablet under the tongue daily. [provider] Taking Active   ?cholecalciferol (VITAMIN D3) 25 MCG (1000 UNIT) tablet [id number] No Take 1,000 Units by mouth daily. [provider] Taking Active   ?clopidogrel (PLAVIX) 75 MG tablet [id number] No TAKE 1 TABLET BY MOUTH ONCE DAILY. Cox, Kirsten, MD Taking Active   ?ezetimibe (ZETIA) 10 MG tablet [id number] No TAKE 1 TABLET BY MOUTH AT BEDTIME Cox, Kirsten, MD Taking Active   ?hydrochlorothiazide (MICROZIDE) 12.5 MG capsule [id number]  Take 1 capsule (12.5 mg total) by mouth daily. Cox, Kirsten, MD  Active   ?ibuprofen (ADVIL,MOTRIN) 200 MG tablet [id number] No Take 400 mg by mouth 2 (two) times daily as needed for headache or moderate pain. [provider] Taking Active   ?magnesium gluconate (MAGONATE) 500 MG tablet [id number] No Take 500 mg by mouth daily. [provider] Taking Active   ?magnesium hydroxide (MILK OF MAGNESIA) 400 MG/5ML suspension [id number] No Take by mouth daily as needed for mild constipation. [provider] Taking Active   ?rosuvastatin (CRESTOR) 5 MG tablet [id number]  TAKE 1 TABLET BY MOUTH TWICE WEEKLY Cox, Kirsten, MD  Active   ?tiZANidine (ZANAFLEX) 2 MG tablet [id number]  Take 1 tablet (2 mg total) by mouth every 6 (six) hours as needed for muscle spasms. Cox, Kirsten, MD  Active   ?trolamine salicylate (ASPERCREME) 10 % cream [id number] No Apply 1 application topically as needed for muscle pain. [provider] Taking Active   ?valsartan (DIOVAN) 320 MG tablet [id number]  TAKE 1 TABLET BY MOUTH ONCE DAILY Cox, Kirsten, MD  Active   ? ?  ?  ? ?  ? ? ?Patient Active Problem List  ? Diagnosis Date Noted  ? A

## 2021-07-19 ENCOUNTER — Other Ambulatory Visit: Payer: Self-pay | Admitting: Family Medicine

## 2021-07-19 DIAGNOSIS — I10 Essential (primary) hypertension: Secondary | ICD-10-CM

## 2021-07-19 DIAGNOSIS — E785 Hyperlipidemia, unspecified: Secondary | ICD-10-CM

## 2021-07-22 NOTE — Telephone Encounter (Signed)
Refill sent to pharmacy.   

## 2021-08-13 ENCOUNTER — Other Ambulatory Visit: Payer: Self-pay | Admitting: Family Medicine

## 2021-09-13 ENCOUNTER — Other Ambulatory Visit: Payer: Self-pay | Admitting: Family Medicine

## 2021-10-19 ENCOUNTER — Other Ambulatory Visit: Payer: Self-pay | Admitting: Family Medicine

## 2021-10-24 ENCOUNTER — Other Ambulatory Visit: Payer: Self-pay | Admitting: *Deleted

## 2021-10-24 DIAGNOSIS — I739 Peripheral vascular disease, unspecified: Secondary | ICD-10-CM

## 2021-10-24 DIAGNOSIS — I779 Disorder of arteries and arterioles, unspecified: Secondary | ICD-10-CM

## 2021-11-19 ENCOUNTER — Ambulatory Visit: Payer: PPO

## 2021-11-19 ENCOUNTER — Encounter (HOSPITAL_COMMUNITY): Payer: PPO

## 2021-11-28 ENCOUNTER — Ambulatory Visit: Payer: PPO | Admitting: Family Medicine

## 2021-12-02 ENCOUNTER — Other Ambulatory Visit: Payer: Self-pay

## 2021-12-02 NOTE — Telephone Encounter (Signed)
Carla Romero called about her appointment last week.  She left before her appointment because her headache was severe, and she was concerned the storm that was coming.  She wanted say that she was sorry that she had to leave.

## 2021-12-17 ENCOUNTER — Encounter (HOSPITAL_COMMUNITY): Payer: PPO

## 2021-12-17 ENCOUNTER — Ambulatory Visit: Payer: PPO

## 2022-02-11 ENCOUNTER — Other Ambulatory Visit: Payer: Self-pay | Admitting: Family Medicine

## 2022-02-27 DIAGNOSIS — E785 Hyperlipidemia, unspecified: Secondary | ICD-10-CM | POA: Diagnosis not present

## 2022-02-27 DIAGNOSIS — I10 Essential (primary) hypertension: Secondary | ICD-10-CM | POA: Diagnosis not present

## 2022-03-06 DIAGNOSIS — I739 Peripheral vascular disease, unspecified: Secondary | ICD-10-CM | POA: Diagnosis not present

## 2022-03-06 DIAGNOSIS — E785 Hyperlipidemia, unspecified: Secondary | ICD-10-CM | POA: Diagnosis not present

## 2022-03-06 DIAGNOSIS — Z1321 Encounter for screening for nutritional disorder: Secondary | ICD-10-CM | POA: Diagnosis not present

## 2022-03-06 DIAGNOSIS — I10 Essential (primary) hypertension: Secondary | ICD-10-CM | POA: Diagnosis not present

## 2022-03-06 DIAGNOSIS — Z13228 Encounter for screening for other metabolic disorders: Secondary | ICD-10-CM | POA: Diagnosis not present

## 2023-04-02 DIAGNOSIS — R451 Restlessness and agitation: Secondary | ICD-10-CM

## 2023-04-02 DIAGNOSIS — I10 Essential (primary) hypertension: Secondary | ICD-10-CM | POA: Diagnosis not present

## 2023-04-02 DIAGNOSIS — F419 Anxiety disorder, unspecified: Secondary | ICD-10-CM | POA: Diagnosis not present

## 2023-04-02 DIAGNOSIS — F0390 Unspecified dementia without behavioral disturbance: Secondary | ICD-10-CM

## 2023-04-02 DIAGNOSIS — I739 Peripheral vascular disease, unspecified: Secondary | ICD-10-CM | POA: Diagnosis not present

## 2023-04-02 DIAGNOSIS — E785 Hyperlipidemia, unspecified: Secondary | ICD-10-CM | POA: Diagnosis not present

## 2023-04-21 DIAGNOSIS — F0390 Unspecified dementia without behavioral disturbance: Secondary | ICD-10-CM | POA: Diagnosis not present

## 2023-04-21 DIAGNOSIS — R1110 Vomiting, unspecified: Secondary | ICD-10-CM | POA: Diagnosis not present

## 2023-04-21 DIAGNOSIS — K219 Gastro-esophageal reflux disease without esophagitis: Secondary | ICD-10-CM | POA: Diagnosis not present

## 2023-04-21 DIAGNOSIS — K5900 Constipation, unspecified: Secondary | ICD-10-CM | POA: Diagnosis not present

## 2023-05-07 DIAGNOSIS — R112 Nausea with vomiting, unspecified: Secondary | ICD-10-CM | POA: Diagnosis not present

## 2023-05-07 DIAGNOSIS — F0390 Unspecified dementia without behavioral disturbance: Secondary | ICD-10-CM | POA: Diagnosis not present

## 2023-05-07 DIAGNOSIS — R197 Diarrhea, unspecified: Secondary | ICD-10-CM | POA: Diagnosis not present

## 2023-05-08 ENCOUNTER — Inpatient Hospital Stay (HOSPITAL_COMMUNITY): Payer: PPO

## 2023-05-08 ENCOUNTER — Emergency Department (HOSPITAL_COMMUNITY): Payer: PPO

## 2023-05-08 ENCOUNTER — Encounter (HOSPITAL_COMMUNITY): Payer: Self-pay | Admitting: Neurological Surgery

## 2023-05-08 ENCOUNTER — Inpatient Hospital Stay (HOSPITAL_COMMUNITY)
Admission: EM | Admit: 2023-05-08 | Discharge: 2023-05-10 | DRG: 064 | Disposition: A | Discharge status: Hospice | Payer: PPO | Attending: Neurology | Admitting: Neurology

## 2023-05-08 DIAGNOSIS — Z888 Allergy status to other drugs, medicaments and biological substances status: Secondary | ICD-10-CM

## 2023-05-08 DIAGNOSIS — Z7189 Other specified counseling: Secondary | ICD-10-CM | POA: Diagnosis not present

## 2023-05-08 DIAGNOSIS — Z7982 Long term (current) use of aspirin: Secondary | ICD-10-CM | POA: Diagnosis not present

## 2023-05-08 DIAGNOSIS — I2510 Atherosclerotic heart disease of native coronary artery without angina pectoris: Secondary | ICD-10-CM | POA: Diagnosis present

## 2023-05-08 DIAGNOSIS — E876 Hypokalemia: Secondary | ICD-10-CM | POA: Diagnosis present

## 2023-05-08 DIAGNOSIS — I609 Nontraumatic subarachnoid hemorrhage, unspecified: Secondary | ICD-10-CM | POA: Diagnosis present

## 2023-05-08 DIAGNOSIS — E854 Organ-limited amyloidosis: Secondary | ICD-10-CM | POA: Diagnosis present

## 2023-05-08 DIAGNOSIS — F0390 Unspecified dementia without behavioral disturbance: Secondary | ICD-10-CM | POA: Diagnosis not present

## 2023-05-08 DIAGNOSIS — F02A0 Dementia in other diseases classified elsewhere, mild, without behavioral disturbance, psychotic disturbance, mood disturbance, and anxiety: Secondary | ICD-10-CM

## 2023-05-08 DIAGNOSIS — I739 Peripheral vascular disease, unspecified: Secondary | ICD-10-CM | POA: Diagnosis present

## 2023-05-08 DIAGNOSIS — F0394 Unspecified dementia, unspecified severity, with anxiety: Secondary | ICD-10-CM | POA: Diagnosis present

## 2023-05-08 DIAGNOSIS — I619 Nontraumatic intracerebral hemorrhage, unspecified: Secondary | ICD-10-CM | POA: Diagnosis present

## 2023-05-08 DIAGNOSIS — Z79899 Other long term (current) drug therapy: Secondary | ICD-10-CM | POA: Diagnosis not present

## 2023-05-08 DIAGNOSIS — Z66 Do not resuscitate: Secondary | ICD-10-CM | POA: Diagnosis present

## 2023-05-08 DIAGNOSIS — I610 Nontraumatic intracerebral hemorrhage in hemisphere, subcortical: Secondary | ICD-10-CM

## 2023-05-08 DIAGNOSIS — I611 Nontraumatic intracerebral hemorrhage in hemisphere, cortical: Secondary | ICD-10-CM | POA: Diagnosis present

## 2023-05-08 DIAGNOSIS — I680 Cerebral amyloid angiopathy: Secondary | ICD-10-CM | POA: Diagnosis present

## 2023-05-08 DIAGNOSIS — I10 Essential (primary) hypertension: Secondary | ICD-10-CM | POA: Diagnosis present

## 2023-05-08 DIAGNOSIS — Z515 Encounter for palliative care: Secondary | ICD-10-CM

## 2023-05-08 DIAGNOSIS — M810 Age-related osteoporosis without current pathological fracture: Secondary | ICD-10-CM | POA: Diagnosis present

## 2023-05-08 DIAGNOSIS — E785 Hyperlipidemia, unspecified: Secondary | ICD-10-CM | POA: Diagnosis present

## 2023-05-08 DIAGNOSIS — G8191 Hemiplegia, unspecified affecting right dominant side: Secondary | ICD-10-CM | POA: Diagnosis present

## 2023-05-08 DIAGNOSIS — F17210 Nicotine dependence, cigarettes, uncomplicated: Secondary | ICD-10-CM | POA: Diagnosis present

## 2023-05-08 DIAGNOSIS — G936 Cerebral edema: Secondary | ICD-10-CM

## 2023-05-08 DIAGNOSIS — R29725 NIHSS score 25: Secondary | ICD-10-CM | POA: Diagnosis present

## 2023-05-08 DIAGNOSIS — Z7902 Long term (current) use of antithrombotics/antiplatelets: Secondary | ICD-10-CM | POA: Diagnosis not present

## 2023-05-08 DIAGNOSIS — J449 Chronic obstructive pulmonary disease, unspecified: Secondary | ICD-10-CM | POA: Diagnosis present

## 2023-05-08 DIAGNOSIS — R4701 Aphasia: Secondary | ICD-10-CM | POA: Diagnosis present

## 2023-05-08 DIAGNOSIS — Z1152 Encounter for screening for COVID-19: Secondary | ICD-10-CM | POA: Diagnosis not present

## 2023-05-08 DIAGNOSIS — I615 Nontraumatic intracerebral hemorrhage, intraventricular: Principal | ICD-10-CM

## 2023-05-08 DIAGNOSIS — Z8249 Family history of ischemic heart disease and other diseases of the circulatory system: Secondary | ICD-10-CM

## 2023-05-08 DIAGNOSIS — R29810 Facial weakness: Secondary | ICD-10-CM | POA: Diagnosis present

## 2023-05-08 DIAGNOSIS — R1310 Dysphagia, unspecified: Secondary | ICD-10-CM | POA: Diagnosis present

## 2023-05-08 DIAGNOSIS — I6389 Other cerebral infarction: Secondary | ICD-10-CM | POA: Diagnosis not present

## 2023-05-08 DIAGNOSIS — K219 Gastro-esophageal reflux disease without esophagitis: Secondary | ICD-10-CM | POA: Diagnosis present

## 2023-05-08 DIAGNOSIS — Z83438 Family history of other disorder of lipoprotein metabolism and other lipidemia: Secondary | ICD-10-CM

## 2023-05-08 LAB — COMPREHENSIVE METABOLIC PANEL
ALT: 21 U/L (ref 0–44)
AST: 32 U/L (ref 15–41)
Albumin: 4.1 g/dL (ref 3.5–5.0)
Alkaline Phosphatase: 83 U/L (ref 38–126)
Anion gap: 13 (ref 5–15)
BUN: 18 mg/dL (ref 8–23)
CO2: 24 mmol/L (ref 22–32)
Calcium: 9.8 mg/dL (ref 8.9–10.3)
Chloride: 102 mmol/L (ref 98–111)
Creatinine, Ser: 0.86 mg/dL (ref 0.44–1.00)
GFR, Estimated: >60 mL/min (ref >60)
Glucose, Bld: 110 mg/dL — ABNORMAL HIGH (ref 70–99)
Potassium: 3.8 mmol/L (ref 3.5–5.1)
Sodium: 139 mmol/L (ref 135–145)
Total Bilirubin: 0.7 mg/dL (ref 0.0–1.2)
Total Protein: 7.7 g/dL (ref 6.5–8.1)

## 2023-05-08 LAB — I-STAT VENOUS BLOOD GAS, ED
Acid-Base Excess: 4 mmol/L — ABNORMAL HIGH (ref 0.0–2.0)
Bicarbonate: 27 mmol/L (ref 20.0–28.0)
Calcium, Ion: 1.11 mmol/L — ABNORMAL LOW (ref 1.15–1.40)
HCT: 42 % (ref 36.0–46.0)
Hemoglobin: 14.3 g/dL (ref 12.0–15.0)
O2 Saturation: 100 %
Potassium: 4.4 mmol/L (ref 3.5–5.1)
Sodium: 140 mmol/L (ref 135–145)
TCO2: 28 mmol/L (ref 22–32)
pCO2, Ven: 33.3 mm[Hg] — ABNORMAL LOW (ref 44–60)
pH, Ven: 7.518 — ABNORMAL HIGH (ref 7.25–7.43)
pO2, Ven: 205 mm[Hg] — ABNORMAL HIGH (ref 32–45)

## 2023-05-08 LAB — TROPONIN I (HIGH SENSITIVITY)
Troponin I (High Sensitivity): 15 ng/L (ref <18)
Troponin I (High Sensitivity): 11 ng/L (ref <18)

## 2023-05-08 LAB — RESP PANEL BY RT-PCR (RSV, FLU A&B, COVID)  RVPGX2
Influenza A by PCR: NEGATIVE
Influenza B by PCR: NEGATIVE
Resp Syncytial Virus by PCR: NEGATIVE
SARS Coronavirus 2 by RT PCR: NEGATIVE

## 2023-05-08 LAB — CBC
HCT: 30 % — ABNORMAL LOW (ref 36.0–46.0)
Hemoglobin: 9.7 g/dL — ABNORMAL LOW (ref 12.0–15.0)
MCH: 29.8 pg (ref 26.0–34.0)
MCHC: 32.3 g/dL (ref 30.0–36.0)
MCV: 92.3 fL (ref 80.0–100.0)
Platelets: 245 10*3/uL (ref 150–400)
RBC: 3.25 MIL/uL — ABNORMAL LOW (ref 3.87–5.11)
RDW: 15.1 % (ref 11.5–15.5)
WBC: 15.7 10*3/uL — ABNORMAL HIGH (ref 4.0–10.5)
nRBC: 0 % (ref 0.0–0.2)

## 2023-05-08 LAB — MRSA NEXT GEN BY PCR, NASAL: MRSA by PCR Next Gen: NOT DETECTED

## 2023-05-08 LAB — SODIUM: Sodium: 141 mmol/L (ref 135–145)

## 2023-05-08 MED ORDER — PANTOPRAZOLE SODIUM 40 MG IV SOLR
40.0000 mg | Freq: Every day | INTRAVENOUS | Status: DC
  Administered 2023-05-08 – 2023-05-09 (×2): 40 mg via INTRAVENOUS
  Filled 2023-05-08 (×2): qty 10

## 2023-05-08 MED ORDER — ORAL CARE MOUTH RINSE: 15.0000 mL | OROMUCOSAL | Status: DC | PRN

## 2023-05-08 MED ORDER — CHLORHEXIDINE GLUCONATE CLOTH 2 % EX PADS
6.0000 | MEDICATED_PAD | Freq: Every day | CUTANEOUS | Status: DC
  Administered 2023-05-08 – 2023-05-10 (×3): 6 via TOPICAL

## 2023-05-08 MED ORDER — IOHEXOL 350 MG/ML SOLN
75.0000 mL | Freq: Once | INTRAVENOUS | Status: AC | PRN
  Administered 2023-05-08: 75 mL via INTRAVENOUS

## 2023-05-08 MED ORDER — ORAL CARE MOUTH RINSE
15.0000 mL | OROMUCOSAL | Status: DC
  Administered 2023-05-09 – 2023-05-10 (×7): 15 mL via OROMUCOSAL

## 2023-05-08 MED ORDER — CLEVIDIPINE BUTYRATE 0.5 MG/ML IV EMUL
0.0000 mg/h | INTRAVENOUS | Status: DC
  Administered 2023-05-08: 8 mg/h via INTRAVENOUS
  Administered 2023-05-08: 2 mg/h via INTRAVENOUS
  Administered 2023-05-09: 10 mg/h via INTRAVENOUS
  Administered 2023-05-09: 6 mg/h via INTRAVENOUS
  Administered 2023-05-09 (×2): 10 mg/h via INTRAVENOUS
  Administered 2023-05-10 (×2): 12 mg/h via INTRAVENOUS
  Administered 2023-05-10: 10 mg/h via INTRAVENOUS
  Filled 2023-05-08: qty 50
  Filled 2023-05-08 (×9): qty 100

## 2023-05-08 MED ORDER — ACETAMINOPHEN 160 MG/5ML PO SOLN: 650.0000 mg | ORAL | Status: DC | PRN

## 2023-05-08 MED ORDER — SODIUM CHLORIDE 3 % IV SOLN
INTRAVENOUS | Status: DC
  Filled 2023-05-08 (×3): qty 500

## 2023-05-08 MED ORDER — ORAL CARE MOUTH RINSE
15.0000 mL | OROMUCOSAL | Status: DC
  Administered 2023-05-08: 15 mL via OROMUCOSAL

## 2023-05-08 MED ORDER — ACETAMINOPHEN 325 MG PO TABS: 650.0000 mg | ORAL_TABLET | ORAL | Status: DC | PRN

## 2023-05-08 MED ORDER — SENNOSIDES-DOCUSATE SODIUM 8.6-50 MG PO TABS
1.0000 | ORAL_TABLET | Freq: Two times a day (BID) | ORAL | Status: DC
  Filled 2023-05-08: qty 1

## 2023-05-08 MED ORDER — ACETAMINOPHEN 650 MG RE SUPP: 650.0000 mg | RECTAL | Status: DC | PRN

## 2023-05-08 MED ORDER — STROKE: EARLY STAGES OF RECOVERY BOOK
Freq: Once | Status: AC
Start: 2023-05-09 — End: 2023-05-09

## 2023-05-08 NOTE — ED Provider Notes (Signed)
[place] EMERGENCY DEPARTMENT AT [facility] Provider Note   CSN: [id number] Arrival date & time: 05/08/23  1016     History  Chief Complaint  Patient presents with   Altered Mental Status   Bradycardia    Carla Romero is a 78 y.o. female.  HPI 78 year old female history of dementia, hypertension, hyperlipidemia, presents today with altered mental status.  Her son, Clide Cliff, next of kin, is at bedside.  He states that she has had dementia for about a year.  In November an acute event seem to occur and she worsened and was placed in dementia care unit.  He reports that she has been doing well there, eating, social, up walking around and still toileting.  He saw her yesterday and she was altered from prior.  He reports that she care unit said she had not been herself for 2 days.  He reports that she had a fever to 102 yesterday.    Home Medications Prior to Admission medications   Medication Sig Start Date End Date Taking? Authorizing Provider  acetaminophen (TYLENOL) 325 MG tablet Take 650 mg by mouth every 6 (six) hours as needed for mild pain (pain score 1-3) or moderate pain (pain score 4-6).   Yes [provider]  aspirin EC 81 MG tablet Take 81 mg by mouth daily.   Yes [provider]  clopidogrel (PLAVIX) 75 MG tablet TAKE 1 TABLET BY MOUTH ONCE DAILY. 09/15/21  Yes Cox, Kirsten, MD  Ensure (ENSURE) Take 1 Can by mouth in the morning, at noon, and at bedtime.   Yes [provider]  ezetimibe (ZETIA) 10 MG tablet TAKE 1 TABLET BY MOUTH AT BEDTIME Patient taking differently: Take 10 mg by mouth daily. 08/13/21  Yes Cox, Kirsten, MD  LORazepam (ATIVAN) 0.5 MG tablet Take 0.25 mg by mouth daily as needed for anxiety.   Yes [provider]  omeprazole (PRILOSEC) 20 MG capsule Take 20 mg by mouth in the morning.   Yes [provider]  polyethylene glycol (MIRALAX / GLYCOLAX) 17 g packet Take 17 g by mouth daily.   Yes [provider]  QUEtiapine (SEROQUEL) 25 MG tablet Take 25 mg by mouth at bedtime.   Yes [provider]  valsartan (DIOVAN) 320 MG tablet TAKE 1 TABLET BY MOUTH ONCE DAILY 09/15/21  Yes Cox, Kirsten, MD      Allergies    Ace inhibitors, Trilipix [choline fenofibrate], and Zocor [simvastatin]    Review of Systems   Review of Systems  Physical Exam Updated Vital Signs BP (!) 152/50   Pulse (!) 46   Temp 98.4 F (36.9 C) (Oral)   Resp 15   SpO2 100%  Physical Exam Vitals reviewed.  Constitutional:      Appearance: She is ill-appearing.  HENT:     Head: Normocephalic and atraumatic.     Right Ear: External ear normal.     Left Ear: External ear normal.     Nose: Nose normal.     Mouth/Throat:     Pharynx: Oropharynx is clear.  Eyes:     Pupils: Pupils are equal, round, and reactive to light.  Cardiovascular:     Rate and Rhythm: Bradycardia present.     Pulses: Normal pulses.  Pulmonary:     Effort: Pulmonary effort is normal.     Breath sounds: Normal breath sounds.  Abdominal:     General: Abdomen is flat.  Musculoskeletal:  General: No deformity. Normal range of motion.     Cervical back: Normal range of motion.  Skin:    General: Skin is warm and dry.     Capillary Refill: Capillary refill takes less than 2 seconds.  Neurological:     General: No focal deficit present.     ED Results / Procedures / Treatments   Labs (all labs ordered are listed, but only abnormal results are displayed) Labs Reviewed  CBC - Abnormal; Notable for the following components:      Result Value   WBC 15.7 (*)    RBC 3.25 (*)    Hemoglobin 9.7 (*)    HCT 30.0 (*)    All other components within normal limits  COMPREHENSIVE METABOLIC PANEL - Abnormal; Notable for the following components:   Glucose, Bld 110 (*)    All other components within normal limits  I-STAT VENOUS BLOOD GAS, ED - Abnormal; Notable for the following components:   pH, Ven 7.518 (*)    pCO2,  Ven 33.3 (*)    pO2, Ven 205 (*)    Acid-Base Excess 4.0 (*)    Calcium, Ion 1.11 (*)    All other components within normal limits  CULTURE, BLOOD (ROUTINE X 2)  CULTURE, BLOOD (ROUTINE X 2)  RESP PANEL BY RT-PCR (RSV, FLU A&B, COVID)  [id number]  URINALYSIS, ROUTINE W REFLEX MICROSCOPIC  TROPONIN I (HIGH SENSITIVITY)  TROPONIN I (HIGH SENSITIVITY)    EKG EKG Interpretation Date/Time:  Friday May 08 2023 10:32:09 EST Ventricular Rate:  44 PR Interval:  134 QRS Duration:  88 QT Interval:  509 QTC Calculation: 436 R Axis:   63  Text Interpretation: Sinus bradycardia Ventricular premature complex Probable left atrial enlargement Minimal ST depression, anterolateral leads Confirmed by Margarita Grizzle [phone]) on 05/08/2023 11:23:55 AM  Radiology DG Chest Port 1 View Result Date: 05/08/2023 CLINICAL DATA:  Altered mental status 2-3 days ago. EXAM: PORTABLE CHEST 1 VIEW COMPARISON:  03/17/2023 FINDINGS: Heart size is normal. Mediastinal shadows are unremarkable except for mild aortic atherosclerosis. The lungs are clear. The vascularity is normal. No infiltrate, collapse or effusion. No acute bone finding. IMPRESSION: No active disease. Aortic atherosclerosis. Electronically Signed   By: Paulina Fusi M.D.   On: 05/08/2023 13:07   CT Head Wo Contrast Result Date: 05/08/2023 CLINICAL DATA:  Mental status change of unknown cause EXAM: CT HEAD WITHOUT CONTRAST TECHNIQUE: Contiguous axial images were obtained from the base of the skull through the vertex without intravenous contrast. RADIATION DOSE REDUCTION: This exam was performed according to the departmental dose-optimization program which includes automated exposure control, adjustment of the mA and/or kV according to patient size and/or use of iterative reconstruction technique. COMPARISON:  03/17/2023 FINDINGS: Brain: Intraparenchymal hemorrhage within the left frontal lobe measuring approximately 5 x 5 x 4 cm (volume = 50 cm^3). Mild  surrounding edema suggesting that this may be 12-32-day-old. Subarachnoid hemorrhage within the sulci, actually more on the right than the left. Some blood dependent within the lateral ventricles. All of these findings are consistent with a 1-2 day old insult. Mild fullness of the ventricular system, similar to the study of November 2024. Left-to-right shift of 6 mm. Small amount of subdural blood along the left lateral convexity, maximal thickness 3-4 mm. Vascular: There is atherosclerotic calcification of the major vessels at the base of the brain. Skull: No skull fracture. Sinuses/Orbits: Clear/normal Other: None IMPRESSION: 1. Intraparenchymal hemorrhage within the left frontal lobe measuring approximately 5 x 5  x 4 cm (volume = 50 cc). Mild surrounding edema suggesting that this may be 8-86-day-old. 2. Subarachnoid hemorrhage within the sulci, actually more on the right than the left. Some blood dependent within the lateral ventricles. All of these findings are consistent with a 1-2 day old insult. 3. Left-to-right shift of 6 mm. 4. Small amount of subdural blood along the left lateral convexity, maximal thickness 3-4 mm. 5. Findings consistent with mBIG 3. 6. Critical Value/emergent results were called by telephone at the time of interpretation on 05/08/2023 at 1:00 pm to provider [facility] Cayden Rautio , who verbally acknowledged these results. Electronically Signed   By: Paulina Fusi M.D.   On: 05/08/2023 13:07    Procedures .Critical Care  Performed by: Margarita Grizzle, MD Authorized by: Margarita Grizzle, MD   Critical care provider statement:    Critical care time (minutes):  80   Critical care end time:  05/08/2023 3:25 PM   Critical care was necessary to treat or prevent imminent or life-threatening deterioration of the following conditions:  CNS failure or compromise   Critical care was time spent personally by me on the following activities:  Ordering and performing treatments and interventions, ordering and  review of laboratory studies, ordering and review of radiographic studies, pulse oximetry, re-evaluation of patient's condition, development of treatment plan with patient or surrogate, discussions with consultants, evaluation of patient's response to treatment and examination of patient     Medications Ordered in ED Medications  clevidipine (CLEVIPREX) infusion 0.5 mg/mL (2 mg/hr Intravenous New Bag/Given 05/08/23 1340)    ED Course/ Medical Decision Making/ A&P Clinical Course as of 05/08/23 1526  Fri May 08, 2023  1122 Bp noted as 216/56- asked rn to recheck manually before initiating medical therapy [DR]  1202 Temp: 99.8 F (37.7 C) [DR]  1205 BP at 160/80 [DR]  1220 BP 150/ 64 [DR]  1258 CT Head Wo Contrast [DR]  1301 CT Head Wo Contrast [DR]  1302 CT Head Wo Contrast    [DR]  1325 CBC reviewed interpreted significant for white blood cell count of 15,700 hemoglobin 9.7 hematocrit 30 [DR]  1331 Discussed care with Dr. Marikay Alar on-call for neurosurgery.  He advises that there is unlikely to be any acute intervention.  He will see in consult on patient but advises neuro hospitalist for admission [DR]  1333 Complete metabolic panel reviewed interpreted and within normal limits [DR]    Clinical Course User Index [DR] Margarita Grizzle, MD                                 Medical Decision Making Amount and/or Complexity of Data Reviewed Labs: ordered. Radiology: ordered. Decision-making details documented in ED Course.   78 year old female history of dementia presents today with acutely altered mental with reported last known normal 2 days ago. No focal deficits are appreciated on exam however it is difficult as patient is not responsive Patient is hypertensive to 216/56 will have this rechecked She is bradycardic to 42 Head CT results called and reviewed and patient has large intracranial hemorrhage.  Son and daughter-in-law not currently at bedside.  I did attempt to call them.   We had initially had goals of care conversation that were having discussions around how aggressive they would wish to be.  She is DNR baseline.  They state they will be back in the ED in 20 to 30 minutes. I discussed the care  with Dr. Yetta Barre who advises that there is unlikely to have any intervention to offer this patient. At this time, I have not aggressively intubated the patient.  She is not responsive, but does currently appear to be breathing on her own and protecting her airway.  I will await more aggressive intervention until son returns to bedside so we can discuss.  Discussed care with Dr. Petra Kuba. Lengthy goals of care discussion with family and will place consult to palliative care Doctor Pearlean Brownie has seen and evaluated patient.  Advised neurology will admit.  They have started hypertonic saline. Dr. Yetta Barre has seen patient but note is currently pending I have discussed care with palliative care who will see and evaluate patient        Final Clinical Impression(s) / ED Diagnoses Final diagnoses:  Nontraumatic intracerebral hemorrhage, unspecified cerebral location, unspecified laterality [facility])    Rx / DC Orders ED Discharge Orders     None         Margarita Grizzle, MD 05/08/23 1526

## 2023-05-08 NOTE — Consult Note (Signed)
Reason for Consult: I[facility] Referring Physician: EDP  Carla Romero is an 78 y.o. female.   HPI:  78 year old female seen in neurosurgical consultation regarding a left frontal intercerebral hemorrhage.  Patient is unable to cooperate with history and physical and therefore history is taken from her son.  Sounds like she had a change in mental status over the last 2 or 3 days.  He was brought to emergency department today with a change in mental status, no speech, and no eating.  Head CT showed a large left frontal cerebral hemorrhage and neurosurgical evaluation was requested.  Sounds like her memory issues and speech issues started over a year ago.  They have been especially significant since Thanksgiving and she has been in memory care for the last 6 or 7 weeks.  Past Medical History:  Diagnosis Date   Acute viral syndrome    Anemia    Vit D deficiency   Arteritis, unspecified (HCC) November 01, 2013   Arthritis    "fingers, knees, upper spine" (06/24/2016)   B12 deficiency anemia    "had injections in the 1970s"   CAD (coronary artery disease)    COPD (chronic obstructive pulmonary disease) (HCC)    "no one has ever told me I have this" (06/24/2016)   Diverticulitis    GERD (gastroesophageal reflux disease)    Hyperlipidemia    Hypertension    Osteoporosis    Pain October 28, 2013   Left Lower quadrant / back   Thrombocytopenia [facility])     Past Surgical History:  Procedure Laterality Date   ABDOMINAL AORTOGRAM W/LOWER EXTREMITY N/A 06/24/2016   Procedure: Abdominal Aortogram w/Lower Extremity;  Surgeon: Nada Libman, MD;  Location: [facility] INVASIVE CV LAB;  Service: Cardiovascular;  Laterality: N/A;   ABDOMINAL HYSTERECTOMY     APPENDECTOMY     COLONOSCOPY  Aug. 4, 2014   FEMORAL ARTERY STENT Left 2018   [employer] 6 mm x 80 mm x 130 cm. Dr Myra Gianotti [phone]   TUBAL LIGATION      Allergies  Allergen Reactions   Ace Inhibitors Cough    Allergy not listed on MAR    Trilipix  [Choline Fenofibrate] Other (See Comments)    Muscle cramps, muscle stiffness. Allergy not listed on MAR    Zocor [Simvastatin] Other (See Comments)    Myalgia. Allergy no listed on MAR     Social History   Tobacco Use   Smoking status: Every Day    Current packs/day: 0.12    Average packs/day: 0.1 packs/day for 43.0 years (5.2 ttl pk-yrs)    Types: Cigarettes   Smokeless tobacco: Never   Tobacco comments:    5-7 cigarettes per day  Substance Use Topics   Alcohol use: Yes    Alcohol/week: 2.0 - 3.0 standard drinks of alcohol    Types: 2 - 3 Standard drinks or equivalent per week    Comment: 06/24/2016 "might have a few drinks/year"    Family History  Problem Relation Age of Onset   Hypertension Mother    Hyperlipidemia Mother    Hypertension Sister      Review of Systems  Positive ROS: Unable to obtain  All other systems have been reviewed and were otherwise negative with the exception of those mentioned in the HPI and as above.  Objective: Vital signs in last 24 hours: Temp:  [97.6 F (36.4 C)-99.8 F (37.7 C)] 98.4 F (36.9 C) (01/17 1523) Pulse Rate:  [42-51] 46 (01/17 1445) Resp:  [  14-24] 15 (01/17 1445) BP: (142-216)/(46-81) 152/50 (01/17 1445) SpO2:  [100 %] 100 % (01/17 1445)  General Appearance: Elderly female lying in a gurney with eyes closed Head: Normocephalic, without obvious abnormality, atraumatic Eyes: Pupils 2 mm and unreactive, gaze conjugate Ears: Normal TM's and external ear canals, both ears Back: Symmetric, no curvature, ROM normal, no CVA tenderness Lungs: respirations unlabored Heart: Regular rate and rhythm Abdomen: Soft Extremities: Extremities normal, atraumatic, no cyanosis or edema Pulses: 2+ and symmetric all extremities Skin: Skin color, texture, turgor normal, no rashes or lesions  NEUROLOGIC:   Mental status: Patient with eyes closed but maintaining airway, did open eyes to noxious stimuli and looked around the room Motor Exam  -localizes bilaterally Sensory Exam -localizes Reflexes: symmetric, no pathologic reflexes, No Hoffman's, No clonus Coordination -unable to test Gait -unable to test Balance -unable to test Cranial Nerves: I: smell Not tested  II: visual acuity  OS: na  OD: na  II: visual fields Full to confrontation  II: pupils Equal, round, reactive to light  III,VII: ptosis None  III,IV,VI: extraocular muscles  Full ROM  V: mastication Normal  V: facial light touch sensation  Normal  V,VII: corneal reflex  Present  VII: facial muscle function - upper  Normal  VII: facial muscle function - lower Normal  VIII: hearing Not tested  IX: soft palate elevation  Normal  IX,X: gag reflex Present  XI: trapezius strength  5/5  XI: sternocleidomastoid strength 5/5  XI: neck flexion strength  5/5  XII: tongue strength  Normal    Data Review Lab Results  Component Value Date   WBC 15.7 (H) 05/08/2023   HGB 14.3 05/08/2023   HCT 42.0 05/08/2023   MCV 92.3 05/08/2023   PLT 245 05/08/2023   Lab Results  Component Value Date   NA 140 05/08/2023   K 4.4 05/08/2023   CL 102 05/08/2023   CO2 24 05/08/2023   BUN 18 05/08/2023   CREATININE 0.86 05/08/2023   GLUCOSE 110 (H) 05/08/2023   No results found for: "INR", "PROTIME"  Radiology: [facility] Chest Port 1 View Result Date: 05/08/2023 CLINICAL DATA:  Altered mental status 2-3 days ago. EXAM: PORTABLE CHEST 1 VIEW COMPARISON:  03/17/2023 FINDINGS: Heart size is normal. Mediastinal shadows are unremarkable except for mild aortic atherosclerosis. The lungs are clear. The vascularity is normal. No infiltrate, collapse or effusion. No acute bone finding. IMPRESSION: No active disease. Aortic atherosclerosis. Electronically Signed   By: Paulina Fusi M.D.   On: 05/08/2023 13:07   CT Head Wo Contrast Result Date: 05/08/2023 CLINICAL DATA:  Mental status change of unknown cause EXAM: CT HEAD WITHOUT CONTRAST TECHNIQUE: Contiguous axial images were obtained from the  base of the skull through the vertex without intravenous contrast. RADIATION DOSE REDUCTION: This exam was performed according to the departmental dose-optimization program which includes automated exposure control, adjustment of the mA and/or kV according to patient size and/or use of iterative reconstruction technique. COMPARISON:  03/17/2023 FINDINGS: Brain: Intraparenchymal hemorrhage within the left frontal lobe measuring approximately 5 x 5 x 4 cm (volume = 50 cm^3). Mild surrounding edema suggesting that this may be 54-70-day-old. Subarachnoid hemorrhage within the sulci, actually more on the right than the left. Some blood dependent within the lateral ventricles. All of these findings are consistent with a 1-2 day old insult. Mild fullness of the ventricular system, similar to the study of November 2024. Left-to-right shift of 6 mm. Small amount of subdural blood along the left  lateral convexity, maximal thickness 3-4 mm. Vascular: There is atherosclerotic calcification of the major vessels at the base of the brain. Skull: No skull fracture. Sinuses/Orbits: Clear/normal Other: None IMPRESSION: 1. Intraparenchymal hemorrhage within the left frontal lobe measuring approximately 5 x 5 x 4 cm (volume = 50 cc). Mild surrounding edema suggesting that this may be 27-22-day-old. 2. Subarachnoid hemorrhage within the sulci, actually more on the right than the left. Some blood dependent within the lateral ventricles. All of these findings are consistent with a 1-2 day old insult. 3. Left-to-right shift of 6 mm. 4. Small amount of subdural blood along the left lateral convexity, maximal thickness 3-4 mm. 5. Findings consistent with mBIG 3. 6. Critical Value/emergent results were called by telephone at the time of interpretation on 05/08/2023 at 1:00 pm to provider [facility] RAY , who verbally acknowledged these results. Electronically Signed   By: Paulina Fusi M.D.   On: 05/08/2023 13:07     Assessment/Plan: Estimated  body mass index is 20.8 kg/m as calculated from the following:   Height as of 05/23/21: 5\' 4"  (1.626 m).   Weight as of 05/23/21: 74 kg.   78 year old female with dementia in a memory care unit with a large left frontal hemorrhage.  She is not requiring intubation.  She is likely aphasic.  Given her premorbid status, we would not recommend aggressive decompressive surgery.  Her son and his wife agree that she would not want that.  We recommend DNR/DNI with management in the ICU with the stroke team.  Dr. Pearlean Brownie is at the bedside.  Please call if we can be of any assistance.   Tia Alert 05/08/2023 3:23 PM

## 2023-05-08 NOTE — Progress Notes (Addendum)
Initial assessment at shift of patient's NIH identified an increase of score from 13 to 25. Neurology paged to communicate findings, verbal order to continue with plan of care of 3% hypertonic saline @ 50 mL/hr and Cleviprex to maintain BP goal of 130-150. MRI scheduled at 0300. Findings of NIH at 1900 are below.    05/08/23 1900  NIH Stroke Scale   Dizziness Present  (UTA, patient unable to verbalize dizziness)  Headache Present  (UTA, patient unable to verbalize headache)  Interval Shift assessment  Level of Consciousness (1a.)    2  LOC Questions (1b. )    2  LOC Commands (1c. )    2  Best Gaze (2. )   0  Visual (3. )   0  Facial Palsy (4. )     0  Motor Arm, Left (5a. )    3  Motor Arm, Right (5b. )  3  Motor Leg, Left (6a. )   3  Motor Leg, Right (6b. )  3  Limb Ataxia (7. ) 0  Sensory (8. )   0  Best Language (9. )   3  Dysarthria (10. ) 2  Extinction/Inattention (11.)    2  Complete NIHSS TOTAL 25

## 2023-05-08 NOTE — H&P (Addendum)
Admission H&P    Chief Complaint: Altered mental status HPI: Carla Romero is an 78 y.o. female with hypertension, hyperlipidemia, dementia staging the memory care unit was brought in for evaluation for altered mental status for the last 2 to 3 days.  Patient is unable to provide history which is obtained from his son was at the bedside.  Patient was diagnosed with dementia about a year ago and has been managed by his primary care physician in [place].  Following an acute event she had cognitive worsening in November 2024 and since then has been in the memory care unit.  Patient apparently was doing well there and was pretty much ambulating using a walker and able to recognize family members for the last few days she has not done well.  She has been sleepy she has not been eating or taking her medicines.  She also had a bout of diarrhea and had a fever of 102 yesterday.  There is no documented seizures, fall, head injury, focal extremity weakness numbness or strokelike symptoms.  Noncontrast CAT scan of the head was done today upon arrival in the emergency room which showed a large 5 x 5 cm left frontal parasagittal parenchymal hemorrhage with surrounding cytotoxic edema and 5 mm left-to-right midline shift with trace intraventricular extension.  Patient has been seen by neurosurgeon Dr. Marikay Alar and given patient's poor functional status was not felt to be candidate for ventriculostomy or craniotomy at this time. Patient has no prior history of strokes TIA seizures or significant neurological problems.  Patient has not seen a neurologist before and details about dementia evaluation and treatment but not known to the patient's son. LSN: 3 days ago tPA Given: No as patient has intracerebral hemorrhage  Past Medical History:  Diagnosis Date   Acute viral syndrome    Anemia    Vit D deficiency   Arteritis, unspecified (HCC) November 01, 2013   Arthritis    "fingers, knees, upper spine" (06/24/2016)    B12 deficiency anemia    "had injections in the 1970s"   CAD (coronary artery disease)    COPD (chronic obstructive pulmonary disease) (HCC)    "no one has ever told me I have this" (06/24/2016)   Diverticulitis    GERD (gastroesophageal reflux disease)    Hyperlipidemia    Hypertension    Osteoporosis    Pain October 28, 2013   Left Lower quadrant / back   Thrombocytopenia [facility])     Past Surgical History:  Procedure Laterality Date   ABDOMINAL AORTOGRAM W/LOWER EXTREMITY N/A 06/24/2016   Procedure: Abdominal Aortogram w/Lower Extremity;  Surgeon: Nada Libman, MD;  Location: [facility] INVASIVE CV LAB;  Service: Cardiovascular;  Laterality: N/A;   ABDOMINAL HYSTERECTOMY     APPENDECTOMY     COLONOSCOPY  Aug. 4, 2014   FEMORAL ARTERY STENT Left 2018   [employer] 6 mm x 80 mm x 130 cm. Dr Myra Gianotti [phone]   TUBAL LIGATION      Family History  Problem Relation Age of Onset   Hypertension Mother    Hyperlipidemia Mother    Hypertension Sister    Social History:  reports that she has been smoking cigarettes. She has a 5.2 pack-year smoking history. She has never used smokeless tobacco. She reports current alcohol use of about 2.0 - 3.0 standard drinks of alcohol per week. She reports that she does not use drugs.  Allergies:  Allergies  Allergen Reactions   Ace Inhibitors Cough  Allergy not listed on MAR    Trilipix [Choline Fenofibrate] Other (See Comments)    Muscle cramps, muscle stiffness. Allergy not listed on MAR    Zocor [Simvastatin] Other (See Comments)    Myalgia. Allergy no listed on MAR     (Not in a hospital admission)   ROS: Diarrhea, nausea, vomiting, for appetite, memory loss, confusion all other systems negative  Physical Examination: Blood pressure (!) 152/50, pulse (!) 46, temperature 98.4 F (36.9 C), temperature source Oral, resp. rate 15, SpO2 100%. Frail elderly Caucasian lady.  Not in distress. Cardiac exam no murmur gallop. Lungs clear to  auscultation.  No crackles Abdomen soft nontender. Distal pulses well felt.  No pedal edema  Neurologic Examination: Patient is stuporous and opens eyes only partially to sternal rub.  She has left gaze preference.  She has some continents eye movements to the right past midline but does not recall the way.  She does not blink to threat on either side.  Pupils are 3 mm reactive.  Corneal reflexes are present bilaterally.  She has mild right facial weakness.  Tongue midline.  Motor system exam she has spontaneous left upper and lower extremity movements against gravity.  She withdraws right lower greater than upper extremity to noxious stimuli but has right-sided weakness.  Right plantar is equivocal left is downgoing.  Results for orders placed or performed during the hospital encounter of 05/08/23 (from the past 48 hours)  CBC     Status: Abnormal   Collection Time: 05/08/23 11:52 AM  Result Value Ref Range   WBC 15.7 (H) 4.0 - 10.5 K/uL   RBC 3.25 (L) 3.87 - 5.11 MIL/uL   Hemoglobin 9.7 (L) 12.0 - 15.0 g/dL   HCT 95.6 (L) 21.3 - 08.6 %   MCV 92.3 80.0 - 100.0 fL   MCH 29.8 26.0 - 34.0 pg   MCHC 32.3 30.0 - 36.0 g/dL   RDW 57.8 46.9 - 62.9 %   Platelets 245 150 - 400 K/uL   nRBC 0.0 0.0 - 0.2 %    Comment: Performed at [facility], [address] [place], [place] [address]  Comprehensive metabolic panel     Status: Abnormal   Collection Time: 05/08/23 11:52 AM  Result Value Ref Range   Sodium 139 135 - 145 mmol/L   Potassium 3.8 3.5 - 5.1 mmol/L   Chloride 102 98 - 111 mmol/L   CO2 24 22 - 32 mmol/L   Glucose, Bld 110 (H) 70 - 99 mg/dL    Comment: Glucose reference range applies only to samples taken after fasting for at least 8 hours.   BUN 18 8 - 23 mg/dL   Creatinine, Ser 3.24 0.44 - 1.00 mg/dL   Calcium 9.8 8.9 - 40.1 mg/dL   Total Protein 7.7 6.5 - 8.1 g/dL   Albumin 4.1 3.5 - 5.0 g/dL   AST 32 15 - 41 U/L   ALT 21 0 - 44 U/L   Alkaline Phosphatase 83 38 - 126 U/L    Total Bilirubin 0.7 0.0 - 1.2 mg/dL   GFR, Estimated >02 >72 mL/min    Comment: (NOTE) Calculated using the CKD-EPI Creatinine Equation (2021)    Anion gap 13 5 - 15    Comment: Performed at [facility], [address] [place], [place] [address]  Troponin I (High Sensitivity)     Status: None   Collection Time: 05/08/23 11:52 AM  Result Value Ref Range  Troponin I (High Sensitivity) 15 <18 ng/L    Comment: (NOTE) Elevated high sensitivity troponin I (hsTnI) values and significant  changes across serial measurements may suggest ACS but many other  chronic and acute conditions are known to elevate hsTnI results.  Refer to the "Links" section for chest pain algorithms and additional  guidance. Performed at [facility], [address], [place], [place] [address]   I-Stat venous blood gas, ED     Status: Abnormal   Collection Time: 05/08/23  1:43 PM  Result Value Ref Range   pH, Ven 7.518 (H) 7.25 - 7.43   pCO2, Ven 33.3 (L) 44 - 60 mmHg   pO2, Ven 205 (H) 32 - 45 mmHg   Bicarbonate 27.0 20.0 - 28.0 mmol/L   TCO2 28 22 - 32 mmol/L   O2 Saturation 100 %   Acid-Base Excess 4.0 (H) 0.0 - 2.0 mmol/L   Sodium 140 135 - 145 mmol/L   Potassium 4.4 3.5 - 5.1 mmol/L   Calcium, Ion 1.11 (L) 1.15 - 1.40 mmol/L   HCT 42.0 36.0 - 46.0 %   Hemoglobin 14.3 12.0 - 15.0 g/dL   Sample type VENOUS    DG Chest Port 1 View Result Date: 05/08/2023 CLINICAL DATA:  Altered mental status 2-3 days ago. EXAM: PORTABLE CHEST 1 VIEW COMPARISON:  03/17/2023 FINDINGS: Heart size is normal. Mediastinal shadows are unremarkable except for mild aortic atherosclerosis. The lungs are clear. The vascularity is normal. No infiltrate, collapse or effusion. No acute bone finding. IMPRESSION: No active disease. Aortic atherosclerosis. Electronically Signed   By: Paulina Fusi M.D.   On: 05/08/2023 13:07   CT Head Wo Contrast Result Date: 05/08/2023 CLINICAL DATA:  Mental status change of unknown cause  EXAM: CT HEAD WITHOUT CONTRAST TECHNIQUE: Contiguous axial images were obtained from the base of the skull through the vertex without intravenous contrast. RADIATION DOSE REDUCTION: This exam was performed according to the departmental dose-optimization program which includes automated exposure control, adjustment of the mA and/or kV according to patient size and/or use of iterative reconstruction technique. COMPARISON:  03/17/2023 FINDINGS: Brain: Intraparenchymal hemorrhage within the left frontal lobe measuring approximately 5 x 5 x 4 cm (volume = 50 cm^3). Mild surrounding edema suggesting that this may be 22-58-day-old. Subarachnoid hemorrhage within the sulci, actually more on the right than the left. Some blood dependent within the lateral ventricles. All of these findings are consistent with a 1-2 day old insult. Mild fullness of the ventricular system, similar to the study of November 2024. Left-to-right shift of 6 mm. Small amount of subdural blood along the left lateral convexity, maximal thickness 3-4 mm. Vascular: There is atherosclerotic calcification of the major vessels at the base of the brain. Skull: No skull fracture. Sinuses/Orbits: Clear/normal Other: None IMPRESSION: 1. Intraparenchymal hemorrhage within the left frontal lobe measuring approximately 5 x 5 x 4 cm (volume = 50 cc). Mild surrounding edema suggesting that this may be 40-38-day-old. 2. Subarachnoid hemorrhage within the sulci, actually more on the right than the left. Some blood dependent within the lateral ventricles. All of these findings are consistent with a 1-2 day old insult. 3. Left-to-right shift of 6 mm. 4. Small amount of subdural blood along the left lateral convexity, maximal thickness 3-4 mm. 5. Findings consistent with mBIG 3. 6. Critical Value/emergent results were called by telephone at the time of interpretation on 05/08/2023 at 1:00 pm to provider [facility] RAY , who verbally acknowledged these results. Electronically  Signed  By: Paulina Fusi M.D.   On: 05/08/2023 13:07    Assessment: 78 y.o. female altered mental status for the last 2 to 3 days secondary to large left frontal parenchymal hematoma with cytotoxic edema and left-to-right midline shift with mild intraventricular extension with no hydrocephalus.   Etiology for hemorrhage is indeterminate and differentials include hemorrhagic infarct versus tumor with the bleed versus amyloid angiopathy for cerebral venous sinus thrombosis Patient has baseline dementia and lives in memory care unit  Plan: 1.  Admit to neurological ICU under stroke MD for ongoing neurological care. 2.  Close neurological monitoring as per ICU protocol.  Strict blood pressure control with systolic goal between 130-150 for the first 24 hours and then below 160.  Use IV Cardene drip and may also use as needed labetalol hydralazine as needed.   3.  Start hypertonic saline through peripheral IV at rate of 50 cc an hour with serum sodium goal between 150-155.  Check serum sodium every 6 hourly as per protocol.   4.  Check CT angiogram of the brain and CT venogram as well as MRI scan of the brain  DVT and GI prophylaxis.  Maintain normothermia , euglycemia  Patient has baseline dementia and has a fairly large intracerebral hemorrhage and her prognosis is quite poor and chances of surviving this without major deficits are close to being negligible.  I had a long discussion with patient's son and daughter-in-law at the bedside and answered questions.  Patient is DNR as per her wishes after discussion with patient's son Clide Cliff at the bedside.  He is quite realistic and would like to support her for a few days to see if there is any meaningful improvement but he does not clearly want intubation, cardiac compression for aggressive brain surgery. Discussed with Dr. Marikay Alar neurosurgery who agrees patient not a candidate for any neurosurgical intervention at this time. Discussed with Dr. Hipolito Bayley, ER, MD who is in agreement with the plan.  This patient is critically ill and at significant risk of neurological worsening, death and care requires constant monitoring of vital signs, hemodynamics,respiratory and cardiac monitoring, extensive review of multiple databases, frequent neurological assessment, discussion with family, other specialists and medical decision making of high complexity.I have made any additions or clarifications directly to the above note.This critical care time does not reflect procedure time, or teaching time or supervisory time of PA/NP/Med Resident etc but could involve care discussion time.  I spent 50 minutes of neurocritical care time  in the care of  this patient.     Delia Heady, MD 05/08/2023, 3:34 PM

## 2023-05-08 NOTE — ED Triage Notes (Signed)
Pt BIB [facility] from [facility] after staff reported she had altered mental status for 2 to 3 days. Reported she was pale and cool. When EMS got to the facility she was bradycardic. EMS was told the patient normally ambulatory, answer questions, but she does have history of dementia. PEMS report her pupils are 2 and nonreactive. Patient have not take her medication for the past 2 to 3 days, because she been having diarrhea and she have not been eating or drinking. Vital Signs: 236/80, HR 40 to 50, Temp. 97.3, Blood sugar 143 and 99% RA.

## 2023-05-09 ENCOUNTER — Other Ambulatory Visit: Payer: Self-pay

## 2023-05-09 ENCOUNTER — Inpatient Hospital Stay (HOSPITAL_COMMUNITY): Payer: PPO

## 2023-05-09 DIAGNOSIS — I611 Nontraumatic intracerebral hemorrhage in hemisphere, cortical: Secondary | ICD-10-CM | POA: Diagnosis not present

## 2023-05-09 DIAGNOSIS — Z515 Encounter for palliative care: Secondary | ICD-10-CM | POA: Diagnosis not present

## 2023-05-09 DIAGNOSIS — Z66 Do not resuscitate: Secondary | ICD-10-CM

## 2023-05-09 DIAGNOSIS — I6389 Other cerebral infarction: Secondary | ICD-10-CM

## 2023-05-09 DIAGNOSIS — Z7189 Other specified counseling: Secondary | ICD-10-CM | POA: Diagnosis not present

## 2023-05-09 LAB — BASIC METABOLIC PANEL
Anion gap: 8 (ref 5–15)
Anion gap: 8 (ref 5–15)
BUN: 18 mg/dL (ref 8–23)
BUN: 16 mg/dL (ref 8–23)
CO2: 23 mmol/L (ref 22–32)
CO2: 22 mmol/L (ref 22–32)
Calcium: 8.9 mg/dL (ref 8.9–10.3)
Calcium: 8.9 mg/dL (ref 8.9–10.3)
Chloride: 117 mmol/L — ABNORMAL HIGH (ref 98–111)
Chloride: 126 mmol/L — ABNORMAL HIGH (ref 98–111)
Creatinine, Ser: 0.88 mg/dL (ref 0.44–1.00)
Creatinine, Ser: 0.8 mg/dL (ref 0.44–1.00)
GFR, Estimated: >60 mL/min (ref >60)
GFR, Estimated: >60 mL/min (ref >60)
Glucose, Bld: 109 mg/dL — ABNORMAL HIGH (ref 70–99)
Glucose, Bld: 95 mg/dL (ref 70–99)
Potassium: 3.2 mmol/L — ABNORMAL LOW (ref 3.5–5.1)
Potassium: 3.3 mmol/L — ABNORMAL LOW (ref 3.5–5.1)
Sodium: 148 mmol/L — ABNORMAL HIGH (ref 135–145)
Sodium: 156 mmol/L — ABNORMAL HIGH (ref 135–145)

## 2023-05-09 LAB — LIPID PANEL
Cholesterol: 199 mg/dL (ref 0–200)
HDL: 77 mg/dL (ref >40)
LDL Cholesterol: 104 mg/dL — ABNORMAL HIGH (ref 0–99)
Total CHOL/HDL Ratio: 2.6 {ratio}
Triglycerides: 91 mg/dL (ref <150)
VLDL: 18 mg/dL (ref 0–40)

## 2023-05-09 LAB — ECHOCARDIOGRAM COMPLETE
AR max vel: 2.15 cm2
AV Area VTI: 2.34 cm2
AV Area mean vel: 2.3 cm2
AV Mean grad: 4 mm[Hg]
AV Peak grad: 9.6 mm[Hg]
Ao pk vel: 1.55 m/s
Area-P 1/2: 2.21 cm2
Calc EF: 66.7 %
MV VTI: 2.26 cm2
S' Lateral: 2.2 cm
Single Plane A2C EF: 69.4 %
Single Plane A4C EF: 68.2 %

## 2023-05-09 LAB — HEMOGLOBIN A1C
Hgb A1c MFr Bld: 5.2 % (ref 4.8–5.6)
Mean Plasma Glucose: 102.54 mg/dL

## 2023-05-09 LAB — CBC
HCT: 33.4 % — ABNORMAL LOW (ref 36.0–46.0)
Hemoglobin: 11.2 g/dL — ABNORMAL LOW (ref 12.0–15.0)
MCH: 30.4 pg (ref 26.0–34.0)
MCHC: 33.5 g/dL (ref 30.0–36.0)
MCV: 90.8 fL (ref 80.0–100.0)
Platelets: 237 10*3/uL (ref 150–400)
RBC: 3.68 MIL/uL — ABNORMAL LOW (ref 3.87–5.11)
RDW: 15.4 % (ref 11.5–15.5)
WBC: 10.9 10*3/uL — ABNORMAL HIGH (ref 4.0–10.5)
nRBC: 0 % (ref 0.0–0.2)

## 2023-05-09 LAB — URINALYSIS, ROUTINE W REFLEX MICROSCOPIC
Bilirubin Urine: NEGATIVE
Glucose, UA: NEGATIVE mg/dL
Hgb urine dipstick: NEGATIVE
Ketones, ur: NEGATIVE mg/dL
Leukocytes,Ua: NEGATIVE
Nitrite: NEGATIVE
Protein, ur: NEGATIVE mg/dL
Specific Gravity, Urine: 1.044 — ABNORMAL HIGH (ref 1.005–1.030)
pH: 6 (ref 5.0–8.0)

## 2023-05-09 LAB — SODIUM
Sodium: 144 mmol/L (ref 135–145)
Sodium: 152 mmol/L — ABNORMAL HIGH (ref 135–145)
Sodium: 158 mmol/L — ABNORMAL HIGH (ref 135–145)

## 2023-05-09 LAB — MAGNESIUM: Magnesium: 2.4 mg/dL (ref 1.7–2.4)

## 2023-05-09 LAB — PHOSPHORUS: Phosphorus: 2 mg/dL — ABNORMAL LOW (ref 2.5–4.6)

## 2023-05-09 MED ORDER — POTASSIUM CHLORIDE 10 MEQ/100ML IV SOLN
10.0000 meq | INTRAVENOUS | Status: AC
  Administered 2023-05-09 (×4): 10 meq via INTRAVENOUS
  Filled 2023-05-09 (×4): qty 100

## 2023-05-09 MED ORDER — GADOBUTROL 1 MMOL/ML IV SOLN
5.0000 mL | Freq: Once | INTRAVENOUS | Status: AC | PRN
  Administered 2023-05-09: 5 mL via INTRAVENOUS

## 2023-05-09 NOTE — Progress Notes (Signed)
This RN present for a GOC meeting facilitated by the palliative care team and stroke team. Carla Picker, NP, and Carla Foot, NP, in attendance, along with this RN.   The current plan of care was discussed, along with Carla Romero wishes discussed in depth. Son, Carla Romero, and daughter (granddaughter of Carla Romero) expressed Carla Romero wishes, in which the decision to switch to comfort care was made, starting tomorrow 1/19.   Carla Russian, RN

## 2023-05-09 NOTE — Progress Notes (Signed)
This RN noticed Ms. Cravens right forearm IV site began to look red and edematous. IV Watch in place, but was not alarming. This RN immediately paused 3% saline, obtained new IV site with new IV Watch in place, and called pharmacy for further interventions. Notified Stroke Team, Dr. Roda Shutters, and Elmer Picker, NP of the incident. This RN was then advised by stroke team to maintain elevation, cold compress to the site, and monitor the site for itching.  Mammie Russian, RN

## 2023-05-09 NOTE — Consult Note (Signed)
Palliative Care Consult Note                                  Date: 05/09/2023   Patient Name: Carla Romero  DOB: [birth year]  MRN: [id number]  Age / Sex: 78 y.o., female  PCP: Crist Fat, MD Referring Physician: Stroke, Md, MD  Reason for Consultation: Establishing goals of care  HPI/Patient Profile: 78 y.o. female  with past medical history of hypertension, hyperlipidemia, dementia who presented to the ED on 05/08/2023 with altered mental status for 2 to 3 days.  CT head showed a large 5 x 5 cm left front parasagittal parenchymal hemorrhage with surrounding cytotoxic edema and 5 mm left-to-right midline shift with trace intraventricular extension.  Patient was seen by neurosurgery and felt not to be a candidate for surgical intervention at this time.  Palliative Medicine was consulted for goals of care.   Subjective:   Extensive chart review has been completed prior to meeting with patient/family including labs, vital signs, imaging, progress/consult notes, orders, medications and available advance directive documents.   I assessed the patient at bedside on 4 N. and received an update from her RN.  Patient remains poorly responsive.  She moves her head and attempts to open her eyes to voice, but does not follow any commands.  I spoke with her son/Richard by phone to discuss diagnosis, prognosis, and GOC.  I introduced Palliative Medicine as specialized medical care for people living with serious illness. It focuses on providing relief from the symptoms and stress of a serious illness.   Created space and opportunity for son to express thoughts and feelings regarding current medical situation. Values and goals of care were attempted to be elicited.  Life Review: Gerlene Burdock is patient's only child.  He describes his mother as "strong-willed" and very independent.  She lived alone in her home after her husband passed away 10+ years ago.   She transition to memory care in 2024 following an acute event that seem to lead to cognitive worsening.  Functional Status: Patient seems to be doing well at the memory care facility.  At baseline, she was ambulating with a walker.  She was able to feed herself and able to perform some basic ADLs with prompting.  GOC Discussion: We discussed patient's current illness and what it means in the larger context of his/her ongoing co-morbidities. Current clinical status was reviewed.   Discussed the natural trajectory of a sudden, severe neurological injury such as ICH. Discussed the concepts of meaningful recovery and quality of life.    Discussed that patients with dementia and other co-morbidities have less reserve to recover from ICH, and that it is unlikely she will return to her previous baseline.   Son is very clear that patient would not want aggressive or extraordinary life-prolonging measures including CPR, intubation, or a feeding tube.  We reviewed the difference between limited supportive interventions (such as Cleviprex and hypertonic saline) versus comfort care and allowing the natural course to occur.  Son agrees with continuing current supportive interventions for now, with plans for further GOC discussion tomorrow in-person.     Review of Systems  Unable to perform ROS   Objective:   Primary Diagnoses: Present on Admission:  ICH (intracerebral hemorrhage) (HCC)   Physical Exam Constitutional:      Appearance: She is ill-appearing.  Pulmonary:     Effort: Pulmonary effort is normal.  Neurological:     Mental Status: She is lethargic.     Comments: Does not follow commands     Palliative Assessment/Data: PPS 20%     Assessment & Plan:   SUMMARY OF RECOMMENDATIONS   Code status changed to DNR with limited interventions (from DNR with interventions) Continue current supportive interventions Plan for in person GOC discussion with son tomorrow - time  TBD  Primary Decision Maker: NEXT OF KIN - Edd Fabian  Prognosis:  Unable to determine  Discharge Planning:  To Be Determined    Thank you for allowing [name] to participate in the care of Carla Romero   Time Total: 75 minutes  Detailed review of medical records (labs, imaging, vital signs), medically appropriate exam, discussed with treatment team, counseling and education to family, & staff, documenting clinical information, coordination of care.   Signed by: Sherlean Foot, NP Palliative Medicine Team  Team Phone # [phone]  For individual providers, please see AMION

## 2023-05-09 NOTE — Progress Notes (Signed)
  Echocardiogram 2D Echocardiogram has been performed.  Ocie Doyne RDCS 05/09/2023, 11:30 AM

## 2023-05-09 NOTE — Progress Notes (Signed)
PT Cancellation Note  Patient Details Name: Carla Romero MRN: [id number] DOB: 05-31-1945   Cancelled Treatment:    Reason Eval/Treat Not Completed: Medical issues which prohibited therapy.  Hold today due to pt with low responsiveness.  Pt may likely be moving to comfort care only 1/19. Will check 1/19 05/09/2023  Jacinto Halim., PT Acute Rehabilitation Services [phone]  (office)   Eliseo Gum Kapil Petropoulos 05/09/2023, 4:26 PM

## 2023-05-09 NOTE — Progress Notes (Addendum)
Palliative Medicine Progress Note   Patient Name: Carla Romero       Date: 05/09/2023 DOB: 26-Feb-1946  Age: 78 y.o. MRN#: [id number] Attending Physician: Stroke, Md, MD Primary Care Physician: Crist Fat, MD Admit Date: 05/08/2023   HPI/Patient Profile: 78 y.o. female  with past medical history of hypertension, hyperlipidemia, dementia who presented to the ED on 05/08/2023 with altered mental status for 2 to 3 days.  CT head showed a large 5 x 5 cm left front parasagittal parenchymal hemorrhage with surrounding cytotoxic edema and 5 mm left-to-right midline shift with trace intraventricular extension.  Patient was seen by neurosurgery and felt not to be a candidate for surgical intervention at this time.   Palliative Medicine was consulted for goals of care.  Subjective: Chart reviewed and update received from RN. On assessment, patient  opens her eyes to voice but does not follow commands. She is on cleviprex and 3% (hypertonic) NaCl.   Family Meeting: This NP, along with neurology NP Adelina Mings and patient's RN, met with son/Ricky and his daughter Florentina Addison (patient's granddaughter) in the 4N consult room.  Reviewed patient's current clinical status as well as MRI results.   Reviewed a large ICH results in significant neurological injury, which affects a person's ability to move, communicate, swallow, and even protect their airway . Discussed the concepts of meaningful recovery and quality of life.     Discussed that patients with dementia and other co-morbidities have less reserve to recover from ICH, and that it is unlikely she will return to her previous baseline even with best case scenario.    Son confirms that patient would not want aggressive or extraordinary life-prolonging  measures including CPR, intubation, or a feeding tube. He reports she would not want to be prolonged in a state where she was dependent for care.  We reviewed the difference between continuing current supportive interventions (including Cleviprex and hypertonic saline) versus comfort care and allowing the natural course to occur.  Discussed that comfort care would look like--keeping her clean and dry, no labs, no artificial hydration or feeding, no cardiac monitoring, minimizing of medications, comfort feeds (if appropriate), medication for pain and other symptoms as needed, transfer out of the ICU. Also discussed there would likely be the option to transfer to an inpatient hospice facility.  After further discussion, family plans to transition to comfort care (likely tomorrow) after additional time for visitation today.   Questions and concerns were addressed. Emotional support provided.    Objective:  Physical Exam Constitutional:      General: She is not in acute distress.    Appearance: She is ill-appearing.  Pulmonary:     Effort: Pulmonary effort is normal.  Neurological:     Mental Status: She is lethargic.     Comments: Not following commands               Palliative Medicine Assessment & Plan   Assessment: Principal Problem:   ICH (intracerebral hemorrhage) (HCC)    Recommendations/Plan: Continue current interventions  Likely transition to comfort care tomorrow 1/19 - family will let the care team know when they are ready PMT will continue to follow  Code Status: DNR - limited   Prognosis:  < 2 weeks  Discharge Planning: To Be Determined    Thank you for allowing the Palliative Medicine Team to assist in the care of this patient.   Time: 65 minutes  Detailed review of medical records (labs, imaging, vital signs), medically appropriate exam, discussed with treatment team, counseling and education to family, & staff, documenting clinical information,  medication management, coordination of care.   Signed: Merry Proud, NP   Please contact Palliative Medicine Team phone at [phone] for questions and concerns.  For individual providers, please see AMION.

## 2023-05-09 NOTE — Progress Notes (Addendum)
STROKE TEAM PROGRESS NOTE   BRIEF HPI Ms. Carla Romero is a 78 y.o. female with history of hypertension, hyperlipidemia, PVD, dementia staging the memory care unit was brought in for evaluation for altered mental status for the last 2 to 3 days.   INTERIM HISTORY/SUBJECTIVE Seen in room, no family at the bedside. Patient opens eyes with noxious stimuli, not following commands, no verbal output, right side weak.   Hypertonic saline infusing. Palliative care meeting today. Awaiting family arrival later today and tomorrow prior to transition to comfort care  OBJECTIVE  CBC    Component Value Date/Time   WBC 10.9 (H) 05/09/2023 0434   RBC 3.68 (L) 05/09/2023 0434   HGB 11.2 (L) 05/09/2023 0434   HGB 13.1 05/23/2021 1004   HCT 33.4 (L) 05/09/2023 0434   HCT 38.1 05/23/2021 1004   PLT 237 05/09/2023 0434   PLT 269 05/23/2021 1004   MCV 90.8 05/09/2023 0434   MCV 88 05/23/2021 1004   MCH 30.4 05/09/2023 0434   MCHC 33.5 05/09/2023 0434   RDW 15.4 05/09/2023 0434   RDW 12.2 05/23/2021 1004   LYMPHSABS 2.1 05/23/2021 1004   EOSABS 0.1 05/23/2021 1004   BASOSABS 0.1 05/23/2021 1004    BMET    Component Value Date/Time   NA 148 (H) 05/09/2023 0434   NA 129 (L) 05/23/2021 1004   K 3.2 (L) 05/09/2023 0434   CL 117 (H) 05/09/2023 0434   CO2 23 05/09/2023 0434   GLUCOSE 109 (H) 05/09/2023 0434   BUN 18 05/09/2023 0434   BUN 11 05/23/2021 1004   CREATININE 0.88 05/09/2023 0434   CREATININE 1.00 01/30/2014 0945   CALCIUM 8.9 05/09/2023 0434   EGFR 57 (L) 05/23/2021 1004   GFRNONAA >60 05/09/2023 0434    IMAGING past 24 hours MR BRAIN W WO CONTRAST Result Date: 05/09/2023 CLINICAL DATA:  Stroke follow-up EXAM: MRI HEAD WITHOUT AND WITH CONTRAST TECHNIQUE: Multiplanar, multiecho pulse sequences of the brain and surrounding structures were obtained without and with intravenous contrast. CONTRAST:  5mL GADAVIST GADOBUTROL 1 MMOL/ML IV SOLN COMPARISON:  Head CT 05/08/2023 FINDINGS:  Brain: Large left frontal intraparenchymal hematoma with overlying subdural blood and right hemispheric multifocal subarachnoid hemorrhage, as previously demonstrated. There is moderate edema surrounding the left frontal hematoma. The regional diffusion changes could indicate acute/early subacute ischemia or susceptibility effects associated with blood products. There is blood layering within the occipital horns of both lateral ventricles. There is rightward midline shift of approximately 6 mm. There is multifocal hyperintense T2-weighted signal within the periventricular and deep white matter. Moderate volume loss. The midline structures are normal. There is no abnormal contrast enhancement. Vascular: Normal flow voids. Skull and upper cervical spine: Normal calvarium and skull base. Visualized upper cervical spine and soft tissues are normal. Sinuses/Orbits:No paranasal sinus fluid levels or advanced mucosal thickening. No mastoid or middle ear effusion. Normal orbits. IMPRESSION: 1. Large left frontal intraparenchymal hematoma with overlying subdural blood and right hemispheric multifocal subarachnoid hemorrhage, as previously demonstrated. 2. Rightward midline shift of approximately 6 mm. 3. Blood layering within the occipital horns of both lateral ventricles. 4. Left frontal diffusion changes could indicate acute/early subacute ischemia or susceptibility effects associated with blood products. Electronically Signed   By: Deatra Robinson M.D.   On: 05/09/2023 03:56   CT ANGIO HEAD NECK W WO CM Result Date: 05/08/2023 CLINICAL DATA:  Intraparenchymal hemorrhage, stroke or dural venous sinus thrombosis suspected EXAM: CT ANGIOGRAPHY HEAD AND NECK CT VENOGRAM TECHNIQUE:  Multidetector CT imaging of the head and neck was performed using the standard protocol during bolus administration of intravenous contrast. Multiplanar CT image reconstructions and MIPs were obtained to evaluate the vascular anatomy. Carotid  stenosis measurements (when applicable) are obtained utilizing NASCET criteria, using the distal internal carotid diameter as the denominator. Venographic phase images of the brain were obtained following the administration of intravenous contrast. Multiplanar reformats and maximum intensity projections were generated. RADIATION DOSE REDUCTION: This exam was performed according to the departmental dose-optimization program which includes automated exposure control, adjustment of the mA and/or kV according to patient size and/or use of iterative reconstruction technique. CONTRAST:  75mL OMNIPAQUE IOHEXOL 350 MG/ML SOLN COMPARISON:  CT head 05/08/2023, no prior CTA or CT V available FINDINGS: CT HEAD FINDINGS For noncontrast findings, please see same day CT head. On postcontrast imaging, there is no evidence of active extravasation into the left frontal lobe hemorrhage. Hyperdense hemorrhage measures up to 7.4 X 5.0 x 4.1 cm, previously 7.4 x 5.1 x 3.9 cm when remeasured similarly, overall unchanged. Surrounding hypodensity, consistent with edema. 7 mm left-to-right midline shift, unchanged when remeasured similarly. CTA NECK FINDINGS Aortic arch: Standard branching. Imaged portion shows no evidence of aneurysm or dissection. No significant stenosis of the major arch vessel origins. Aortic atherosclerosis. Right carotid system: No evidence of dissection, occlusion, or hemodynamically significant stenosis (greater than 50%). Irregularity in the distal ICA, which may represent beading (series 6, image 112), although this may be artifactual. Left carotid system: 70% stenosis in the proximal left ICA, secondary to calcified and noncalcified plaque (series 5, image 202 and series 6, image 106). No evidence of dissection. No definite beading. Vertebral arteries: No evidence of dissection, occlusion, or hemodynamically significant stenosis (greater than 50%). Skeleton: No acute osseous abnormality. Degenerative changes in  the cervical spine. Other neck: No acute finding. Upper chest: Small focus of ground-glass opacity in the right upper lobe (series 5, image 276). Apical pleural-parenchymal scarring. Review of the MIP images confirms the above findings CTA HEAD FINDINGS Anterior circulation: Both internal carotid arteries are patent to the termini, without significant stenosis. A1 segments patent. Normal anterior communicating artery. Anterior cerebral arteries are patent through the A2 segments, with evaluation of more distal segments limited by motion. No M1 stenosis or occlusion. MCA branches are patent through the proximal M3 segments, with evaluation of more distal segments limited by motion. Posterior circulation: Vertebral arteries patent to the vertebrobasilar junction, with mild stenosis and diminished opacification in the mid right V4 (series 5, image 128). Posterior inferior cerebellar arteries patent proximally. Basilar patent to its distal aspect without significant stenosis. Superior cerebellar arteries patent proximally. Patent P1 segments, hypoplastic on the right. Near fetal origin of the right PCA from the right posterior communicating artery. Moderate stenosis left P2 (series 5, image 95) and severe stenosis in the medial left P3 (series 4, image 94). Mild multifocal stenosis in the right P2 (series 5, images 93 and 94) and mild stenosis in the medial right P3 (series 5, image 96). Anatomic variants: Near fetal origin of the right PCA. No evidence of aneurysm or vascular malformation. Review of the MIP images confirms the above findings CTV FINDINGS Superior sagittal sinus: Patent. Straight sinus: Patent. Inferior sagittal sinus, vein of Galen and internal cerebral veins: Patent. Transverse sinuses: Patent. Sigmoid sinuses: Patent. Visualized jugular veins: Patent. IMPRESSION: 1. No evidence of active extravasation into the left frontal lobe hemorrhage. Unchanged size of the hemorrhage and 7 mm left-to-right  midline shift.  2. No intracranial large vessel occlusion. Moderate stenosis in the left P2 and severe stenosis in the medial left P3. Multifocal mild stenosis in the right P2 and P3. 3. 70% stenosis in the proximal left ICA, secondary to calcified and noncalcified plaque. 4. Irregularity in the distal right ICA, which may represent beading, although this may be artifactual. 5. No dural venous sinus thrombosis. 6. Small focus of ground-glass opacity in the right upper lobe, which may be infectious or inflammatory. 7. Aortic atherosclerosis. Aortic Atherosclerosis (ICD10-I70.0). Electronically Signed   By: Wiliam Ke M.D.   On: 05/08/2023 19:10   CT VENOGRAM HEAD Result Date: 05/08/2023 CLINICAL DATA:  Intraparenchymal hemorrhage, stroke or dural venous sinus thrombosis suspected EXAM: CT ANGIOGRAPHY HEAD AND NECK CT VENOGRAM TECHNIQUE: Multidetector CT imaging of the head and neck was performed using the standard protocol during bolus administration of intravenous contrast. Multiplanar CT image reconstructions and MIPs were obtained to evaluate the vascular anatomy. Carotid stenosis measurements (when applicable) are obtained utilizing NASCET criteria, using the distal internal carotid diameter as the denominator. Venographic phase images of the brain were obtained following the administration of intravenous contrast. Multiplanar reformats and maximum intensity projections were generated. RADIATION DOSE REDUCTION: This exam was performed according to the departmental dose-optimization program which includes automated exposure control, adjustment of the mA and/or kV according to patient size and/or use of iterative reconstruction technique. CONTRAST:  75mL OMNIPAQUE IOHEXOL 350 MG/ML SOLN COMPARISON:  CT head 05/08/2023, no prior CTA or CT V available FINDINGS: CT HEAD FINDINGS For noncontrast findings, please see same day CT head. On postcontrast imaging, there is no evidence of active extravasation into the  left frontal lobe hemorrhage. Hyperdense hemorrhage measures up to 7.4 X 5.0 x 4.1 cm, previously 7.4 x 5.1 x 3.9 cm when remeasured similarly, overall unchanged. Surrounding hypodensity, consistent with edema. 7 mm left-to-right midline shift, unchanged when remeasured similarly. CTA NECK FINDINGS Aortic arch: Standard branching. Imaged portion shows no evidence of aneurysm or dissection. No significant stenosis of the major arch vessel origins. Aortic atherosclerosis. Right carotid system: No evidence of dissection, occlusion, or hemodynamically significant stenosis (greater than 50%). Irregularity in the distal ICA, which may represent beading (series 6, image 112), although this may be artifactual. Left carotid system: 70% stenosis in the proximal left ICA, secondary to calcified and noncalcified plaque (series 5, image 202 and series 6, image 106). No evidence of dissection. No definite beading. Vertebral arteries: No evidence of dissection, occlusion, or hemodynamically significant stenosis (greater than 50%). Skeleton: No acute osseous abnormality. Degenerative changes in the cervical spine. Other neck: No acute finding. Upper chest: Small focus of ground-glass opacity in the right upper lobe (series 5, image 276). Apical pleural-parenchymal scarring. Review of the MIP images confirms the above findings CTA HEAD FINDINGS Anterior circulation: Both internal carotid arteries are patent to the termini, without significant stenosis. A1 segments patent. Normal anterior communicating artery. Anterior cerebral arteries are patent through the A2 segments, with evaluation of more distal segments limited by motion. No M1 stenosis or occlusion. MCA branches are patent through the proximal M3 segments, with evaluation of more distal segments limited by motion. Posterior circulation: Vertebral arteries patent to the vertebrobasilar junction, with mild stenosis and diminished opacification in the mid right V4 (series 5,  image 128). Posterior inferior cerebellar arteries patent proximally. Basilar patent to its distal aspect without significant stenosis. Superior cerebellar arteries patent proximally. Patent P1 segments, hypoplastic on the right. Near fetal origin of the right PCA  from the right posterior communicating artery. Moderate stenosis left P2 (series 5, image 95) and severe stenosis in the medial left P3 (series 4, image 94). Mild multifocal stenosis in the right P2 (series 5, images 93 and 94) and mild stenosis in the medial right P3 (series 5, image 96). Anatomic variants: Near fetal origin of the right PCA. No evidence of aneurysm or vascular malformation. Review of the MIP images confirms the above findings CTV FINDINGS Superior sagittal sinus: Patent. Straight sinus: Patent. Inferior sagittal sinus, vein of Galen and internal cerebral veins: Patent. Transverse sinuses: Patent. Sigmoid sinuses: Patent. Visualized jugular veins: Patent. IMPRESSION: 1. No evidence of active extravasation into the left frontal lobe hemorrhage. Unchanged size of the hemorrhage and 7 mm left-to-right midline shift. 2. No intracranial large vessel occlusion. Moderate stenosis in the left P2 and severe stenosis in the medial left P3. Multifocal mild stenosis in the right P2 and P3. 3. 70% stenosis in the proximal left ICA, secondary to calcified and noncalcified plaque. 4. Irregularity in the distal right ICA, which may represent beading, although this may be artifactual. 5. No dural venous sinus thrombosis. 6. Small focus of ground-glass opacity in the right upper lobe, which may be infectious or inflammatory. 7. Aortic atherosclerosis. Aortic Atherosclerosis (ICD10-I70.0). Electronically Signed   By: Wiliam Ke M.D.   On: 05/08/2023 19:10   DG Chest Port 1 View Result Date: 05/08/2023 CLINICAL DATA:  Altered mental status 2-3 days ago. EXAM: PORTABLE CHEST 1 VIEW COMPARISON:  03/17/2023 FINDINGS: Heart size is normal. Mediastinal  shadows are unremarkable except for mild aortic atherosclerosis. The lungs are clear. The vascularity is normal. No infiltrate, collapse or effusion. No acute bone finding. IMPRESSION: No active disease. Aortic atherosclerosis. Electronically Signed   By: Paulina Fusi M.D.   On: 05/08/2023 13:07   CT Head Wo Contrast Result Date: 05/08/2023 CLINICAL DATA:  Mental status change of unknown cause EXAM: CT HEAD WITHOUT CONTRAST TECHNIQUE: Contiguous axial images were obtained from the base of the skull through the vertex without intravenous contrast. RADIATION DOSE REDUCTION: This exam was performed according to the departmental dose-optimization program which includes automated exposure control, adjustment of the mA and/or kV according to patient size and/or use of iterative reconstruction technique. COMPARISON:  03/17/2023 FINDINGS: Brain: Intraparenchymal hemorrhage within the left frontal lobe measuring approximately 5 x 5 x 4 cm (volume = 50 cm^3). Mild surrounding edema suggesting that this may be 86-26-day-old. Subarachnoid hemorrhage within the sulci, actually more on the right than the left. Some blood dependent within the lateral ventricles. All of these findings are consistent with a 1-2 day old insult. Mild fullness of the ventricular system, similar to the study of November 2024. Left-to-right shift of 6 mm. Small amount of subdural blood along the left lateral convexity, maximal thickness 3-4 mm. Vascular: There is atherosclerotic calcification of the major vessels at the base of the brain. Skull: No skull fracture. Sinuses/Orbits: Clear/normal Other: None IMPRESSION: 1. Intraparenchymal hemorrhage within the left frontal lobe measuring approximately 5 x 5 x 4 cm (volume = 50 cc). Mild surrounding edema suggesting that this may be 25-45-day-old. 2. Subarachnoid hemorrhage within the sulci, actually more on the right than the left. Some blood dependent within the lateral ventricles. All of these findings are  consistent with a 1-2 day old insult. 3. Left-to-right shift of 6 mm. 4. Small amount of subdural blood along the left lateral convexity, maximal thickness 3-4 mm. 5. Findings consistent with mBIG 3. 6. Critical Value/emergent  results were called by telephone at the time of interpretation on 05/08/2023 at 1:00 pm to provider [facility] RAY , who verbally acknowledged these results. Electronically Signed   By: Paulina Fusi M.D.   On: 05/08/2023 13:07    Vitals:   05/09/23 0700 05/09/23 0715 05/09/23 0730 05/09/23 0800  BP: (!) 151/52 (!) 149/45 (!) 143/50   Pulse: 79 (!) 58 68   Resp: (!) 31 12 17    Temp:    99.6 F (37.6 C)  TempSrc:    Axillary  SpO2: 100% 100% 100%      PHYSICAL EXAM General:  Alert, well-nourished, well-developed patient in no acute distress Psych:  Mood and affect appropriate for situation CV: Regular rate and rhythm on monitor Respiratory:  Regular, unlabored respirations on room air GI: Abdomen soft and nontender   NEURO:  Patient is stuporous and opens eyes with noxious stimuli. She has left gaze preference.  She has some continents eye movements to the right past midline but does not recall the way.  She does not blink to threat on either side.  Pupils are 3 mm reactive. Corneal reflexes are present bilaterally. She has mild right facial weakness. Tongue midline.  Motor system Spontaneous left upper and lower extremity movements against gravity. Localizes to the right. She withdraws right lower greater than upper extremity to noxious stimuli but has right-sided weakness.    ASSESSMENT/PLAN  ICH:  left frontal large ICH with cytotoxic edema and mild IVH Etiology:  likely cerebral amyloid angiopathy  Code Stroke CT head -  Intraparenchymal hemorrhage within the left frontal lobe measuring approximately 5 x 5 x 4 cm (volume = 50 cc). Mild surrounding edema suggesting that this may be 50-56-day-old. Subarachnoid  hemorrhage within the sulci, actually more on the  right than the left. Some blood dependent within the lateral ventricles. All of these findings are consistent with a 1-2 day old insult. Left-to-right shift of 6 mm. Small amount of subdural blood along the left lateral convexity, maximal thickness 3-4 mm. CTA head & neck 7mm left to right shift, Moderate stenosis in the left P2 and severe stenosis in the medial left P3. Multifocal mild stenosis in the right P2 and P3. 70% stenosis in the proximal left ICA, secondary to calcified and noncalcified plaque. Irregularity in the distal right ICA, which may represent beading, although this may be artifactual. CT Venogram - No dural venous sinus thrombus MRI  Large left frontal intraparenchymal hematoma with overlying subdural blood and right hemispheric multifocal subarachnoid hemorrhage. Rightward midline shift of approximately 6 mm. Blood layering within the occipital horns of both lateral ventricles. Numerous CMBs and hemosiderin suggesting CAA.  2D Echo EF 70-75%  LDL 104 HgbA1c 5.2 VTE prophylaxis - SCDs aspirin 81 mg daily and clopidogrel 75 mg daily prior to admission, now on No antithrombotic due to IPH Therapy recommendations:  Pending Disposition:  pending   Cerebral edema  CT and MRI showed MLS 6mm NSGY consult -> not a candidate for intervention Hypertonic saline 3% 41ml/hr Na 141 ->144 ->148->152 Na monitoring  Hypertension Home meds:  Valsartan Stable BP goal < 140 given CAA  On Cleviprex infusing  Hyperlipidemia Home meds:  Zetia  LDL 104, goal < 70 Hold off Zetia  Not SATURN candidate as she is not on statin  Dysphagia Patient has post-stroke dysphagia SLP consulted NPO now Palliative consult pending- GOC conversation prior to NG tube   Other stroke risk factors Advanced age  Other Active Problems Dementia -  likely associated with CAA, on seroquel PTA Anxiety - PRN ativan Hypokalemia, K 3.2 -> replaced Febrile, reported fever PTA, currently afebrile Foley  placed  Hospital day # 1   Patient seen and examined by NP/APP with MD. MD to update note as needed.   Elmer Picker, DNP, FNP-BC [facility] Pager: [phone]  ATTENDING NOTE: I reviewed above note and agree with the assessment and plan. Pt was seen and examined.   No family at the bedside. RN at the bedside. Pt lethargic but open eyes on voice, global aphasia, no language output, not following commands. Left gaze preference but able to cross midline with right gaze, tracking bilaterally to voice, consistently blinking to visual threat on the left but not on the right, PERRL. Mild right facial droop. Tongue protrusion not cooperative. LUE spontaneous movement and localizing to pain, against gravity but RUE seems to be flaccid. LLE withdraw to pain stronger than RLE. Sensation, coordination and gait not tested.   Pt has large left frontal ICH with mild IVH and MLS. On 3% saline, not candidate surgically. Etiology of ICH likely related to CAA findings on MRI, which is also associated with her dementia. BP goal < 140, no antiplatelet now or after discharge. So far palliative care on board. Hold off NG tube insertion. Continue NPO for now. On cleviprex.   For detailed assessment and plan, please refer to above/below as I have made changes wherever appropriate.   Marvel Plan, MD PhD Stroke Neurology 05/09/2023 6:51 PM  This patient is critically ill due to large ICH and cerebral edema, dysphagia and at significant risk of neurological worsening, death form brain herniation, aspiration, respiratory failure and seizure. This patient's care requires constant monitoring of vital signs, hemodynamics, respiratory and cardiac monitoring, review of multiple databases, neurological assessment, discussion with family, other specialists and medical decision making of high complexity. I spent 40 minutes of neurocritical care time in the care of this patient.    To contact Stroke Continuity  provider, please refer to [email or web address]. After hours, contact General Neurology

## 2023-05-09 NOTE — Plan of Care (Signed)
  Problem: Education: Goal: Knowledge of disease or condition will improve Outcome: Not Progressing Goal: Knowledge of secondary prevention will improve (MUST DOCUMENT ALL) Outcome: Not Progressing Goal: Knowledge of patient specific risk factors will improve (DELETE if not current risk factor) Outcome: Not Progressing   Problem: Intracerebral Hemorrhage Tissue Perfusion: Goal: Complications of Intracerebral Hemorrhage will be minimized Outcome: Not Progressing   Problem: Coping: Goal: Will verbalize positive feelings about self Outcome: Not Progressing Goal: Will identify appropriate support needs Outcome: Not Progressing   Problem: Health Behavior/Discharge Planning: Goal: Ability to manage health-related needs will improve Outcome: Not Progressing Goal: Goals will be collaboratively established with patient/family Outcome: Not Progressing   Problem: Self-Care: Goal: Ability to participate in self-care as condition permits will improve Outcome: Not Progressing Goal: Verbalization of feelings and concerns over difficulty with self-care will improve Outcome: Not Progressing Goal: Ability to communicate needs accurately will improve Outcome: Not Progressing   Problem: Nutrition: Goal: Risk of aspiration will decrease Outcome: Not Progressing Goal: Dietary intake will improve Outcome: Not Progressing   Problem: Education: Goal: Knowledge of General Education information will improve Description: Including pain rating scale, medication(s)/side effects and non-pharmacologic comfort measures Outcome: Not Progressing   Problem: Health Behavior/Discharge Planning: Goal: Ability to manage health-related needs will improve Outcome: Not Progressing

## 2023-05-10 DIAGNOSIS — I611 Nontraumatic intracerebral hemorrhage in hemisphere, cortical: Secondary | ICD-10-CM | POA: Diagnosis not present

## 2023-05-10 DIAGNOSIS — Z515 Encounter for palliative care: Secondary | ICD-10-CM

## 2023-05-10 LAB — BASIC METABOLIC PANEL
Anion gap: 6 (ref 5–15)
BUN: 17 mg/dL (ref 8–23)
CO2: 21 mmol/L — ABNORMAL LOW (ref 22–32)
Calcium: 8.8 mg/dL — ABNORMAL LOW (ref 8.9–10.3)
Chloride: 127 mmol/L — ABNORMAL HIGH (ref 98–111)
Creatinine, Ser: 0.76 mg/dL (ref 0.44–1.00)
GFR, Estimated: >60 mL/min (ref >60)
Glucose, Bld: 98 mg/dL (ref 70–99)
Potassium: 2.8 mmol/L — ABNORMAL LOW (ref 3.5–5.1)
Sodium: 154 mmol/L — ABNORMAL HIGH (ref 135–145)

## 2023-05-10 LAB — CBC
HCT: 33.5 % — ABNORMAL LOW (ref 36.0–46.0)
Hemoglobin: 10.8 g/dL — ABNORMAL LOW (ref 12.0–15.0)
MCH: 30.1 pg (ref 26.0–34.0)
MCHC: 32.2 g/dL (ref 30.0–36.0)
MCV: 93.3 fL (ref 80.0–100.0)
Platelets: 271 10*3/uL (ref 150–400)
RBC: 3.59 MIL/uL — ABNORMAL LOW (ref 3.87–5.11)
RDW: 15.6 % — ABNORMAL HIGH (ref 11.5–15.5)
WBC: 10.6 10*3/uL — ABNORMAL HIGH (ref 4.0–10.5)
nRBC: 0 % (ref 0.0–0.2)

## 2023-05-10 LAB — SODIUM: Sodium: 160 mmol/L — ABNORMAL HIGH (ref 135–145)

## 2023-05-10 MED ORDER — BIOTENE DRY MOUTH MT LIQD
15.0000 mL | OROMUCOSAL | Status: DC | PRN
Start: 2023-05-10 — End: 2023-05-11

## 2023-05-10 MED ORDER — HALOPERIDOL LACTATE 2 MG/ML PO CONC: 0.5000 mg | ORAL | Status: DC | PRN

## 2023-05-10 MED ORDER — LORAZEPAM 2 MG/ML IJ SOLN: INTRAMUSCULAR | 0 refills | Status: AC

## 2023-05-10 MED ORDER — POLYVINYL ALCOHOL 1.4 % OP SOLN: 1.0000 [drp] | Freq: Four times a day (QID) | OPHTHALMIC | 0 refills | Status: AC | PRN

## 2023-05-10 MED ORDER — MORPHINE SULFATE (CONCENTRATE) 10 MG /0.5 ML PO SOLN: 2.5000 mg | ORAL | Status: DC | PRN

## 2023-05-10 MED ORDER — HALOPERIDOL LACTATE 5 MG/ML IJ SOLN: 0.5000 mg | INTRAMUSCULAR | Status: DC | PRN

## 2023-05-10 MED ORDER — BISACODYL 10 MG RE SUPP: 10.0000 mg | Freq: Every day | RECTAL | Status: DC | PRN

## 2023-05-10 MED ORDER — GLYCOPYRROLATE 0.2 MG/ML IJ SOLN: 0.2000 mg | INTRAMUSCULAR | Status: DC | PRN

## 2023-05-10 MED ORDER — MORPHINE SULFATE (CONCENTRATE) 10 MG /0.5 ML PO SOLN: 2.5000 mg | ORAL | 0 refills | Status: AC | PRN

## 2023-05-10 MED ORDER — LORAZEPAM 2 MG/ML IJ SOLN: 1.0000 mg | INTRAMUSCULAR | Status: DC | PRN

## 2023-05-10 MED ORDER — HALOPERIDOL 0.5 MG PO TABS: 0.5000 mg | ORAL_TABLET | ORAL | Status: DC | PRN

## 2023-05-10 MED ORDER — GLYCOPYRROLATE 0.2 MG/ML IJ SOLN: 0.2000 mg | INTRAMUSCULAR | 0 refills | Status: AC | PRN

## 2023-05-10 MED ORDER — ONDANSETRON 4 MG PO TBDP: 4.0000 mg | ORAL_TABLET | Freq: Four times a day (QID) | ORAL | 0 refills | Status: AC | PRN

## 2023-05-10 MED ORDER — ONDANSETRON HCL 4 MG/2ML IJ SOLN: 4.0000 mg | Freq: Four times a day (QID) | INTRAMUSCULAR | Status: DC | PRN

## 2023-05-10 MED ORDER — BISACODYL 10 MG RE SUPP: 10.0000 mg | Freq: Every day | RECTAL | 0 refills | Status: AC | PRN

## 2023-05-10 MED ORDER — BIOTENE DRY MOUTH MT LIQD: 15.0000 mL | OROMUCOSAL | 0 refills | Status: AC | PRN

## 2023-05-10 MED ORDER — LORAZEPAM 2 MG/ML IJ SOLN
1.0000 mg | Freq: Four times a day (QID) | INTRAMUSCULAR | Status: DC
  Administered 2023-05-10 (×2): 1 mg via INTRAVENOUS
  Filled 2023-05-10 (×2): qty 1

## 2023-05-10 MED ORDER — MORPHINE SULFATE (PF) 2 MG/ML IV SOLN
1.0000 mg | INTRAVENOUS | Status: DC | PRN
  Administered 2023-05-10: 2 mg via INTRAVENOUS
  Filled 2023-05-10: qty 1

## 2023-05-10 MED ORDER — HALOPERIDOL LACTATE 2 MG/ML PO CONC: 0.5000 mg | ORAL | 0 refills | Status: AC | PRN

## 2023-05-10 MED ORDER — ONDANSETRON 4 MG PO TBDP: 4.0000 mg | ORAL_TABLET | Freq: Four times a day (QID) | ORAL | Status: DC | PRN

## 2023-05-10 MED ORDER — POLYVINYL ALCOHOL 1.4 % OP SOLN: 1.0000 [drp] | Freq: Four times a day (QID) | OPHTHALMIC | Status: DC | PRN

## 2023-05-10 MED ORDER — GLYCOPYRROLATE 1 MG PO TABS: 1.0000 mg | ORAL_TABLET | ORAL | Status: DC | PRN

## 2023-05-10 NOTE — Progress Notes (Signed)
STROKE TEAM PROGRESS NOTE   BRIEF HPI Ms. Carla Romero is a 78 y.o. female with history of hypertension, hyperlipidemia, PVD, dementia staging the memory care unit was brought in for evaluation for altered mental status for the last 2 to 3 days.  She was found to have a large left frontal ICH with IVH  INTERIM HISTORY/SUBJECTIVE Patient is required Cleviprex to maintain blood pressure within goals overnight.  Hypertonic saline has been discontinued.  Goals of care discussion was held with family and palliative care, and decision was made to transition patient to comfort care.  OBJECTIVE  CBC    Component Value Date/Time   WBC 10.6 (H) 05/10/2023 0441   RBC 3.59 (L) 05/10/2023 0441   HGB 10.8 (L) 05/10/2023 0441   HGB 13.1 05/23/2021 1004   HCT 33.5 (L) 05/10/2023 0441   HCT 38.1 05/23/2021 1004   PLT 271 05/10/2023 0441   PLT 269 05/23/2021 1004   MCV 93.3 05/10/2023 0441   MCV 88 05/23/2021 1004   MCH 30.1 05/10/2023 0441   MCHC 32.2 05/10/2023 0441   RDW 15.6 (H) 05/10/2023 0441   RDW 12.2 05/23/2021 1004   LYMPHSABS 2.1 05/23/2021 1004   EOSABS 0.1 05/23/2021 1004   BASOSABS 0.1 05/23/2021 1004    BMET    Component Value Date/Time   NA 160 (H) 05/10/2023 1106   NA 129 (L) 05/23/2021 1004   K 2.8 (L) 05/10/2023 0441   CL 127 (H) 05/10/2023 0441   CO2 21 (L) 05/10/2023 0441   GLUCOSE 98 05/10/2023 0441   BUN 17 05/10/2023 0441   BUN 11 05/23/2021 1004   CREATININE 0.76 05/10/2023 0441   CREATININE 1.00 01/30/2014 0945   CALCIUM 8.8 (L) 05/10/2023 0441   EGFR 57 (L) 05/23/2021 1004   GFRNONAA >60 05/10/2023 0441    IMAGING past 24 hours No results found.   Vitals:   05/10/23 1115 05/10/23 1130 05/10/23 1145 05/10/23 1200  BP: (!) 141/55 (!) 143/54 (!) 165/67 (!) 161/60  Pulse: 66 67 61 61  Resp: 16 17 18 17   Temp:    99.6 F (37.6 C)  TempSrc:    Axillary  SpO2: 97% 97% 97% 97%     PHYSICAL EXAM General:  Alert, well-nourished, well-developed  elderly patient in no acute distress Psych:  Mood and affect appropriate for situation CV: Regular rate and rhythm on monitor Respiratory:  Regular, unlabored respirations on room air GI: Abdomen soft and nontender   NEURO:  Patient is drowsy but will open eyes to voice and regard examiner.  She has no verbal output.  She blinks to threat on the right but not on the left, right facial droop is evident.  She will localize noxious stimuli with left upper extremity, does not move right upper extremity and withdraws bilateral lower extremities. ASSESSMENT/PLAN  ICH:  left frontal large ICH with cytotoxic edema and mild IVH Etiology:  likely cerebral amyloid angiopathy  Code Stroke CT head -  Intraparenchymal hemorrhage within the left frontal lobe measuring approximately 5 x 5 x 4 cm (volume = 50 cc). Mild surrounding edema suggesting that this may be 57-72-day-old. Subarachnoid  hemorrhage within the sulci, actually more on the right than the left. Some blood dependent within the lateral ventricles. All of these findings are consistent with a 1-2 day old insult. Left-to-right shift of 6 mm. Small amount of subdural blood along the left lateral convexity, maximal thickness 3-4 mm. CTA head & neck 7mm left to right  shift, Moderate stenosis in the left P2 and severe stenosis in the medial left P3. Multifocal mild stenosis in the right P2 and P3. 70% stenosis in the proximal left ICA, secondary to calcified and noncalcified plaque. Irregularity in the distal right ICA, which may represent beading, although this may be artifactual. CT Venogram - No dural venous sinus thrombus MRI  Large left frontal intraparenchymal hematoma with overlying subdural blood and right hemispheric multifocal subarachnoid hemorrhage. Rightward midline shift of approximately 6 mm. Blood layering within the occipital horns of both lateral ventricles. Numerous CMBs and hemosiderin suggesting CAA.  2D Echo EF 70-75%  LDL 104 HgbA1c  5.2 VTE prophylaxis - SCDs aspirin 81 mg daily and clopidogrel 75 mg daily prior to admission, now on No antithrombotic due to IPH Therapy recommendations:  Pending Disposition: Comfort care, plan to transfer to inpatient hospice  Cerebral edema  CT and MRI showed MLS 6mm NSGY consult -> not a candidate for intervention Hypertonic saline 3% 71ml/hr-> discontinued 1/19 Na 141 ->144 ->148->152 Na monitoring  Hypertension Home meds:  Valsartan Stable BP goal < 140 given CAA  Cleviprex discontinued as patient on comfort care  Hyperlipidemia Home meds:  Zetia  LDL 104, goal < 70 Hold off Zetia  Not SATURN candidate as she is not on statin  Dysphagia Patient has post-stroke dysphagia SLP consulted NPO now No NG tube planned, allow comfort feedings as appropriate  Other stroke risk factors Advanced age  Other Active Problems Dementia - likely associated with CAA, on seroquel PTA Anxiety - PRN ativan Hypokalemia, K 3.2 -> replaced Febrile, reported fever PTA, currently afebrile Foley placed  Hospital day # 2   Patient seen and examined by NP/APP with MD. MD to update note as needed.   Carla Romero E Ernestina Columbia , MSN, AGACNP-BC [facility] See Amion for schedule and pager information 05/10/2023 2:21 PM   To contact Stroke Continuity provider, please refer to [email or web address]. After hours, contact General Neurology

## 2023-05-10 NOTE — Care Management (Signed)
Magda Paganini from [facility] stated that the patient has been accepted her number is  Per Audrey's note call Kae Heller to coordinate Message passed to [facility]

## 2023-05-10 NOTE — TOC Transition Note (Signed)
Transition of Care [facility]) - Discharge Note   Patient Details  Name: Carla Romero MRN: [id number] Date of Birth: 14-Jan-1946  [facility]) CM/SW Contact:  Deatra Robinson, [place] Phone Number: 05/10/2023, 4:08 PM   Clinical Narrative: Carla Romero to Carla Romero with [facility] who reports pt has been approved for GIP at they are prepared to admit today. Pt's son Carla Romero reports he has completed admission consents and is agreeable to dc. RN provided with number for report and PTAR arranged for transport. Son requesting to be updated when PTAR arrives, RN made aware. SW signing off at dc.   Carla Romero, MSW, LCSW [phone] (coverage)        Final next level of care: Hospice Medical Facility Barriers to Discharge: Barriers Resolved   Patient Goals and CMS Choice            Discharge Placement                Patient to be transferred to facility by: PTAR Name of family member notified: Carla Romero/son Patient and family notified of of transfer: 05/10/23  Discharge Plan and Services Additional resources added to the After Visit Summary for                                       Social Drivers of Health (SDOH) Interventions SDOH Screenings   Food Insecurity: Patient Unable To Answer (05/08/2023)  Housing: Patient Unable To Answer (05/08/2023)  Transportation Needs: Patient Unable To Answer (05/08/2023)  Utilities: Patient Unable To Answer (05/08/2023)  Depression (PHQ2-9): Low Risk  (11/23/2020)  Financial Resource Strain: Low Risk  (07/08/2021)  Social Connections: Patient Unable To Answer (05/08/2023)  Tobacco Use: High Risk (05/08/2023)     Readmission Risk Interventions     No data to display

## 2023-05-10 NOTE — Consult Note (Signed)
[facility]: Referral received for inpatient hospice [facility]) [address] [place]. Referral worked up and pt approved. Discussed with son and he is in agreement with transfer to [facility] today if possible. Please call Kae Heller, RN [facility] at [phone] to coordinate.

## 2023-05-10 NOTE — Discharge Summary (Addendum)
Stroke Discharge Summary  Patient ID: Carla Romero   MRN: [id number]      DOB: 03/01/1946  Date of Admission: 05/08/2023 Date of Discharge: 05/10/2023  Attending Physician:  Stroke, Md, MD Consultant(s):     Neurosurgery and palliative care   Patient's PCP:  Carla Fat, MD  DISCHARGE PRIMARY DIAGNOSIS:   ICH:  left frontal large ICH with cytotoxic edema and mild IVH Etiology:  likely cerebral amyloid angiopathy   Patient Active Problem List   Diagnosis Date Noted   Hospice care patient 05/10/2023   ICH (intracerebral hemorrhage) (HCC) 05/08/2023   Age-related osteoporosis without current pathological fracture 05/22/2021   Chronic left-sided low back pain without sciatica 12/02/2020   Essential hypertension 12/18/2019   Mixed hyperlipidemia 12/18/2019   PVD (peripheral vascular disease) (HCC) 12/18/2019   Cigarette nicotine dependence with nicotine-induced disorder 10/12/2019   Aortitis (HCC) 11/28/2013     Secondary Diagnoses: Hypertension Hyperlipidemia  Allergies as of 05/10/2023       Reactions   Ace Inhibitors Cough   Allergy not listed on MAR    Trilipix [choline Fenofibrate] Other (See Comments)   Muscle cramps, muscle stiffness. Allergy not listed on MAR    Zocor [simvastatin] Other (See Comments)   Myalgia. Allergy no listed on MAR         Medication List     STOP taking these medications    acetaminophen 325 MG tablet Commonly known as: TYLENOL   aspirin EC 81 MG tablet   clopidogrel 75 MG tablet Commonly known as: PLAVIX   Ensure   ezetimibe 10 MG tablet Commonly known as: ZETIA   LORazepam 0.5 MG tablet Commonly known as: ATIVAN Replaced by: LORazepam 2 MG/ML injection   omeprazole 20 MG capsule Commonly known as: PRILOSEC   polyethylene glycol 17 g packet Commonly known as: MIRALAX / GLYCOLAX   QUEtiapine 25 MG tablet Commonly known as: SEROQUEL   valsartan 320 MG tablet Commonly known as: DIOVAN       TAKE these  medications    antiseptic oral rinse Liqd Apply 15 mLs topically as needed for dry mouth.   bisacodyl 10 MG suppository Commonly known as: DULCOLAX Place 1 suppository (10 mg total) rectally daily as needed for mild constipation.   glycopyrrolate 0.2 MG/ML injection Commonly known as: ROBINUL Inject 1 mL (0.2 mg total) into the skin every 4 (four) hours as needed (excessive secretions).   haloperidol 2 MG/ML solution Commonly known as: HALDOL Place 0.3 mLs (0.6 mg total) under the tongue every 4 (four) hours as needed for agitation (or delirium).   LORazepam 2 MG/ML injection Commonly known as: ATIVAN Inject 0.5 mLs (1 mg total) into the vein every 6 (six) hours. May also inject 0.5-1 mLs (1-2 mg total) every 4 (four) hours as needed for seizure or anxiety. Replaces: LORazepam 0.5 MG tablet   morphine CONCENTRATE 10 mg / 0.5 ml concentrated solution Place 0.13-0.25 mLs (2.6-5 mg total) under the tongue every 2 (two) hours as needed for severe pain (pain score 7-10), shortness of breath or moderate pain (pain score 4-6).   ondansetron 4 MG disintegrating tablet Commonly known as: ZOFRAN-ODT Take 1 tablet (4 mg total) by mouth every 6 (six) hours as needed for nausea.   polyvinyl alcohol 1.4 % ophthalmic solution Commonly known as: LIQUIFILM TEARS Place 1 drop into both eyes 4 (four) times daily as needed for dry eyes.        LABORATORY STUDIES CBC  Component Value Date/Time   WBC 10.6 (H) 05/10/2023 0441   RBC 3.59 (L) 05/10/2023 0441   HGB 10.8 (L) 05/10/2023 0441   HGB 13.1 05/23/2021 1004   HCT 33.5 (L) 05/10/2023 0441   HCT 38.1 05/23/2021 1004   PLT 271 05/10/2023 0441   PLT 269 05/23/2021 1004   MCV 93.3 05/10/2023 0441   MCV 88 05/23/2021 1004   MCH 30.1 05/10/2023 0441   MCHC 32.2 05/10/2023 0441   RDW 15.6 (H) 05/10/2023 0441   RDW 12.2 05/23/2021 1004   LYMPHSABS 2.1 05/23/2021 1004   EOSABS 0.1 05/23/2021 1004   BASOSABS 0.1 05/23/2021 1004    CMP    Component Value Date/Time   NA 160 (H) 05/10/2023 1106   NA 129 (L) 05/23/2021 1004   K 2.8 (L) 05/10/2023 0441   CL 127 (H) 05/10/2023 0441   CO2 21 (L) 05/10/2023 0441   GLUCOSE 98 05/10/2023 0441   BUN 17 05/10/2023 0441   BUN 11 05/23/2021 1004   CREATININE 0.76 05/10/2023 0441   CREATININE 1.00 01/30/2014 0945   CALCIUM 8.8 (L) 05/10/2023 0441   PROT 7.7 05/08/2023 1152   PROT 6.8 05/23/2021 1004   ALBUMIN 4.1 05/08/2023 1152   ALBUMIN 4.7 05/23/2021 1004   AST 32 05/08/2023 1152   ALT 21 05/08/2023 1152   ALKPHOS 83 05/08/2023 1152   BILITOT 0.7 05/08/2023 1152   BILITOT 0.2 05/23/2021 1004   GFRNONAA >60 05/10/2023 0441   GFRAA 59 (L) 03/21/2020 0816   COAGSNo results found for: "INR", "PROTIME" Lipid Panel    Component Value Date/Time   CHOL 199 05/09/2023 0434   CHOL 165 05/23/2021 1004   TRIG 91 05/09/2023 0434   HDL 77 05/09/2023 0434   HDL 66 05/23/2021 1004   CHOLHDL 2.6 05/09/2023 0434   VLDL 18 05/09/2023 0434   LDLCALC 104 (H) 05/09/2023 0434   LDLCALC 85 05/23/2021 1004   HgbA1C  Lab Results  Component Value Date   HGBA1C 5.2 05/09/2023   SIGNIFICANT DIAGNOSTIC STUDIES ECHOCARDIOGRAM COMPLETE Result Date: 05/09/2023    ECHOCARDIOGRAM REPORT   Patient Name:   Carla Romero Date of Exam: 05/09/2023 Medical Rec #:  [id number]        Height:       64.0 in Accession #:    [id number]       Weight:       121.2 lb Date of Birth:  [birth year]        BSA:          1.581 m Patient Age:    78 years         BP:           138/49 mmHg Patient Gender: F                HR:           72 bpm. Exam Location:  Inpatient Procedure: 2D Echo, Color Doppler and Cardiac Doppler Indications:    Stroke  History:        Patient has no prior history of Echocardiogram examinations.                 Risk Factors:Hypertension and Dyslipidemia.  Sonographer:    Vern Claude Referring Phys: [id number] PRAMOD S SETHI IMPRESSIONS  1. Left ventricular ejection fraction, by estimation,  is 70 to 75%. The left ventricle has hyperdynamic function. The left ventricle has no regional wall motion abnormalities. Left ventricular diastolic parameters were normal.  2. Right ventricular systolic function is normal. The right ventricular size is normal. There is normal pulmonary artery systolic pressure.  3. The mitral valve is normal in structure. No evidence of mitral valve regurgitation. No evidence of mitral stenosis.  4. The aortic valve is normal in structure. There is mild calcification of the aortic valve. There is mild thickening of the aortic valve. Aortic valve regurgitation is not visualized. Aortic valve sclerosis is present, with no evidence of aortic valve stenosis.  5. The inferior vena cava is normal in size with greater than 50% respiratory variability, suggesting right atrial pressure of 3 mmHg. FINDINGS  Left Ventricle: Left ventricular ejection fraction, by estimation, is 70 to 75%. The left ventricle has hyperdynamic function. The left ventricle has no regional wall motion abnormalities. The left ventricular internal cavity size was normal in size. There is no left ventricular hypertrophy. Left ventricular diastolic parameters were normal. Right Ventricle: The right ventricular size is normal. No increase in right ventricular wall thickness. Right ventricular systolic function is normal. There is normal pulmonary artery systolic pressure. The tricuspid regurgitant velocity is 2.85 m/s, and  with an assumed right atrial pressure of 3 mmHg, the estimated right ventricular systolic pressure is 35.5 mmHg. Left Atrium: Left atrial size was normal in size. Right Atrium: Right atrial size was normal in size. Pericardium: There is no evidence of pericardial effusion. Mitral Valve: The mitral valve is normal in structure. Mild mitral annular calcification. No evidence of mitral valve regurgitation. No evidence of mitral valve stenosis. MV peak gradient, 5.2 mmHg. The mean mitral valve gradient  is 2.0 mmHg. Tricuspid Valve: The tricuspid valve is normal in structure. Tricuspid valve regurgitation is not demonstrated. No evidence of tricuspid stenosis. Aortic Valve: The aortic valve is normal in structure. There is mild calcification of the aortic valve. There is mild thickening of the aortic valve. Aortic valve regurgitation is not visualized. Aortic valve sclerosis is present, with no evidence of aortic valve stenosis. Aortic valve mean gradient measures 4.0 mmHg. Aortic valve peak gradient measures 9.6 mmHg. Aortic valve area, by VTI measures 2.34 cm. Pulmonic Valve: The pulmonic valve was normal in structure. Pulmonic valve regurgitation is not visualized. No evidence of pulmonic stenosis. Aorta: The aortic root is normal in size and structure. Venous: The inferior vena cava is normal in size with greater than 50% respiratory variability, suggesting right atrial pressure of 3 mmHg. IAS/Shunts: No atrial level shunt detected by color flow Doppler.  LEFT VENTRICLE PLAX 2D LVIDd:         3.50 cm     Diastology LVIDs:         2.20 cm     LV e' medial:    7.83 cm/s LV PW:         0.70 cm     LV E/e' medial:  9.2 LV IVS:        0.70 cm     LV e' lateral:   11.30 cm/s LVOT diam:     1.80 cm     LV E/e' lateral: 6.4 LV SV:         65 LV SV Index:   41 LVOT Area:     2.54 cm  LV Volumes (MOD) LV vol d, MOD A2C: 81.0 ml LV vol d, MOD A4C: 81.1 ml LV vol s, MOD A2C: 24.8 ml LV vol s, MOD A4C: 25.8 ml LV SV MOD A2C:     56.2 ml LV SV MOD A4C:  81.1 ml LV SV MOD BP:      54.6 ml RIGHT VENTRICLE             IVC RV Basal diam:  3.00 cm     IVC diam: 1.60 cm RV Mid diam:    1.80 cm RV S prime:     18.50 cm/s TAPSE (M-mode): 1.8 cm LEFT ATRIUM             Index        RIGHT ATRIUM          Index LA diam:        2.80 cm 1.77 cm/m   RA Area:     6.56 cm LA Vol (A2C):   29.0 ml 18.34 ml/m  RA Volume:   10.10 ml 6.39 ml/m LA Vol (A4C):   19.2 ml 12.14 ml/m LA Biplane Vol: 25.6 ml 16.19 ml/m  AORTIC VALVE                     PULMONIC VALVE AV Area (Vmax):    2.15 cm     PV Vmax:       1.17 m/s AV Area (Vmean):   2.30 cm     PV Peak grad:  5.5 mmHg AV Area (VTI):     2.34 cm AV Vmax:           155.00 cm/s AV Vmean:          89.200 cm/s AV VTI:            0.277 m AV Peak Grad:      9.6 mmHg AV Mean Grad:      4.0 mmHg LVOT Vmax:         131.00 cm/s LVOT Vmean:        80.700 cm/s LVOT VTI:          0.255 m LVOT/AV VTI ratio: 0.92  AORTA Ao Root diam: 2.70 cm Ao Asc diam:  2.70 cm MITRAL VALVE                TRICUSPID VALVE MV Area (PHT): 2.21 cm     TR Peak grad:   32.5 mmHg MV Area VTI:   2.26 cm     TR Vmax:        285.00 cm/s MV Peak grad:  5.2 mmHg MV Mean grad:  2.0 mmHg     SHUNTS MV Vmax:       1.14 m/s     Systemic VTI:  0.26 m MV Vmean:      58.6 cm/s    Systemic Diam: 1.80 cm MV Decel Time: 344 msec MV E velocity: 72.20 cm/s MV A velocity: 101.00 cm/s MV E/A ratio:  0.71 Mihai Croitoru MD Electronically signed by Thurmon Fair MD Signature Date/Time: 05/09/2023/2:44:51 PM    Final    MR BRAIN W WO CONTRAST Result Date: 05/09/2023 CLINICAL DATA:  Stroke follow-up EXAM: MRI HEAD WITHOUT AND WITH CONTRAST TECHNIQUE: Multiplanar, multiecho pulse sequences of the brain and surrounding structures were obtained without and with intravenous contrast. CONTRAST:  5mL GADAVIST GADOBUTROL 1 MMOL/ML IV SOLN COMPARISON:  Head CT 05/08/2023 FINDINGS: Brain: Large left frontal intraparenchymal hematoma with overlying subdural blood and right hemispheric multifocal subarachnoid hemorrhage, as previously demonstrated. There is moderate edema surrounding the left frontal hematoma. The regional diffusion changes could indicate acute/early subacute ischemia or susceptibility effects associated with blood products. There is blood layering within the occipital horns of both lateral ventricles.  There is rightward midline shift of approximately 6 mm. There is multifocal hyperintense T2-weighted signal within the periventricular and deep  white matter. Moderate volume loss. The midline structures are normal. There is no abnormal contrast enhancement. Vascular: Normal flow voids. Skull and upper cervical spine: Normal calvarium and skull base. Visualized upper cervical spine and soft tissues are normal. Sinuses/Orbits:No paranasal sinus fluid levels or advanced mucosal thickening. No mastoid or middle ear effusion. Normal orbits. IMPRESSION: 1. Large left frontal intraparenchymal hematoma with overlying subdural blood and right hemispheric multifocal subarachnoid hemorrhage, as previously demonstrated. 2. Rightward midline shift of approximately 6 mm. 3. Blood layering within the occipital horns of both lateral ventricles. 4. Left frontal diffusion changes could indicate acute/early subacute ischemia or susceptibility effects associated with blood products. Electronically Signed   By: Deatra Robinson M.D.   On: 05/09/2023 03:56   CT ANGIO HEAD NECK W WO CM Result Date: 05/08/2023 CLINICAL DATA:  Intraparenchymal hemorrhage, stroke or dural venous sinus thrombosis suspected EXAM: CT ANGIOGRAPHY HEAD AND NECK CT VENOGRAM TECHNIQUE: Multidetector CT imaging of the head and neck was performed using the standard protocol during bolus administration of intravenous contrast. Multiplanar CT image reconstructions and MIPs were obtained to evaluate the vascular anatomy. Carotid stenosis measurements (when applicable) are obtained utilizing NASCET criteria, using the distal internal carotid diameter as the denominator. Venographic phase images of the brain were obtained following the administration of intravenous contrast. Multiplanar reformats and maximum intensity projections were generated. RADIATION DOSE REDUCTION: This exam was performed according to the departmental dose-optimization program which includes automated exposure control, adjustment of the mA and/or kV according to patient size and/or use of iterative reconstruction technique. CONTRAST:  75mL  OMNIPAQUE IOHEXOL 350 MG/ML SOLN COMPARISON:  CT head 05/08/2023, no prior CTA or CT V available FINDINGS: CT HEAD FINDINGS For noncontrast findings, please see same day CT head. On postcontrast imaging, there is no evidence of active extravasation into the left frontal lobe hemorrhage. Hyperdense hemorrhage measures up to 7.4 X 5.0 x 4.1 cm, previously 7.4 x 5.1 x 3.9 cm when remeasured similarly, overall unchanged. Surrounding hypodensity, consistent with edema. 7 mm left-to-right midline shift, unchanged when remeasured similarly. CTA NECK FINDINGS Aortic arch: Standard branching. Imaged portion shows no evidence of aneurysm or dissection. No significant stenosis of the major arch vessel origins. Aortic atherosclerosis. Right carotid system: No evidence of dissection, occlusion, or hemodynamically significant stenosis (greater than 50%). Irregularity in the distal ICA, which may represent beading (series 6, image 112), although this may be artifactual. Left carotid system: 70% stenosis in the proximal left ICA, secondary to calcified and noncalcified plaque (series 5, image 202 and series 6, image 106). No evidence of dissection. No definite beading. Vertebral arteries: No evidence of dissection, occlusion, or hemodynamically significant stenosis (greater than 50%). Skeleton: No acute osseous abnormality. Degenerative changes in the cervical spine. Other neck: No acute finding. Upper chest: Small focus of ground-glass opacity in the right upper lobe (series 5, image 276). Apical pleural-parenchymal scarring. Review of the MIP images confirms the above findings CTA HEAD FINDINGS Anterior circulation: Both internal carotid arteries are patent to the termini, without significant stenosis. A1 segments patent. Normal anterior communicating artery. Anterior cerebral arteries are patent through the A2 segments, with evaluation of more distal segments limited by motion. No M1 stenosis or occlusion. MCA branches are  patent through the proximal M3 segments, with evaluation of more distal segments limited by motion. Posterior circulation: Vertebral arteries patent to the vertebrobasilar junction,  with mild stenosis and diminished opacification in the mid right V4 (series 5, image 128). Posterior inferior cerebellar arteries patent proximally. Basilar patent to its distal aspect without significant stenosis. Superior cerebellar arteries patent proximally. Patent P1 segments, hypoplastic on the right. Near fetal origin of the right PCA from the right posterior communicating artery. Moderate stenosis left P2 (series 5, image 95) and severe stenosis in the medial left P3 (series 4, image 94). Mild multifocal stenosis in the right P2 (series 5, images 93 and 94) and mild stenosis in the medial right P3 (series 5, image 96). Anatomic variants: Near fetal origin of the right PCA. No evidence of aneurysm or vascular malformation. Review of the MIP images confirms the above findings CTV FINDINGS Superior sagittal sinus: Patent. Straight sinus: Patent. Inferior sagittal sinus, vein of Galen and internal cerebral veins: Patent. Transverse sinuses: Patent. Sigmoid sinuses: Patent. Visualized jugular veins: Patent. IMPRESSION: 1. No evidence of active extravasation into the left frontal lobe hemorrhage. Unchanged size of the hemorrhage and 7 mm left-to-right midline shift. 2. No intracranial large vessel occlusion. Moderate stenosis in the left P2 and severe stenosis in the medial left P3. Multifocal mild stenosis in the right P2 and P3. 3. 70% stenosis in the proximal left ICA, secondary to calcified and noncalcified plaque. 4. Irregularity in the distal right ICA, which may represent beading, although this may be artifactual. 5. No dural venous sinus thrombosis. 6. Small focus of ground-glass opacity in the right upper lobe, which may be infectious or inflammatory. 7. Aortic atherosclerosis. Aortic Atherosclerosis (ICD10-I70.0).  Electronically Signed   By: Wiliam Ke M.D.   On: 05/08/2023 19:10   CT VENOGRAM HEAD Result Date: 05/08/2023 CLINICAL DATA:  Intraparenchymal hemorrhage, stroke or dural venous sinus thrombosis suspected EXAM: CT ANGIOGRAPHY HEAD AND NECK CT VENOGRAM TECHNIQUE: Multidetector CT imaging of the head and neck was performed using the standard protocol during bolus administration of intravenous contrast. Multiplanar CT image reconstructions and MIPs were obtained to evaluate the vascular anatomy. Carotid stenosis measurements (when applicable) are obtained utilizing NASCET criteria, using the distal internal carotid diameter as the denominator. Venographic phase images of the brain were obtained following the administration of intravenous contrast. Multiplanar reformats and maximum intensity projections were generated. RADIATION DOSE REDUCTION: This exam was performed according to the departmental dose-optimization program which includes automated exposure control, adjustment of the mA and/or kV according to patient size and/or use of iterative reconstruction technique. CONTRAST:  75mL OMNIPAQUE IOHEXOL 350 MG/ML SOLN COMPARISON:  CT head 05/08/2023, no prior CTA or CT V available FINDINGS: CT HEAD FINDINGS For noncontrast findings, please see same day CT head. On postcontrast imaging, there is no evidence of active extravasation into the left frontal lobe hemorrhage. Hyperdense hemorrhage measures up to 7.4 X 5.0 x 4.1 cm, previously 7.4 x 5.1 x 3.9 cm when remeasured similarly, overall unchanged. Surrounding hypodensity, consistent with edema. 7 mm left-to-right midline shift, unchanged when remeasured similarly. CTA NECK FINDINGS Aortic arch: Standard branching. Imaged portion shows no evidence of aneurysm or dissection. No significant stenosis of the major arch vessel origins. Aortic atherosclerosis. Right carotid system: No evidence of dissection, occlusion, or hemodynamically significant stenosis (greater  than 50%). Irregularity in the distal ICA, which may represent beading (series 6, image 112), although this may be artifactual. Left carotid system: 70% stenosis in the proximal left ICA, secondary to calcified and noncalcified plaque (series 5, image 202 and series 6, image 106). No evidence of dissection. No definite beading. Vertebral arteries:  No evidence of dissection, occlusion, or hemodynamically significant stenosis (greater than 50%). Skeleton: No acute osseous abnormality. Degenerative changes in the cervical spine. Other neck: No acute finding. Upper chest: Small focus of ground-glass opacity in the right upper lobe (series 5, image 276). Apical pleural-parenchymal scarring. Review of the MIP images confirms the above findings CTA HEAD FINDINGS Anterior circulation: Both internal carotid arteries are patent to the termini, without significant stenosis. A1 segments patent. Normal anterior communicating artery. Anterior cerebral arteries are patent through the A2 segments, with evaluation of more distal segments limited by motion. No M1 stenosis or occlusion. MCA branches are patent through the proximal M3 segments, with evaluation of more distal segments limited by motion. Posterior circulation: Vertebral arteries patent to the vertebrobasilar junction, with mild stenosis and diminished opacification in the mid right V4 (series 5, image 128). Posterior inferior cerebellar arteries patent proximally. Basilar patent to its distal aspect without significant stenosis. Superior cerebellar arteries patent proximally. Patent P1 segments, hypoplastic on the right. Near fetal origin of the right PCA from the right posterior communicating artery. Moderate stenosis left P2 (series 5, image 95) and severe stenosis in the medial left P3 (series 4, image 94). Mild multifocal stenosis in the right P2 (series 5, images 93 and 94) and mild stenosis in the medial right P3 (series 5, image 96). Anatomic variants: Near fetal  origin of the right PCA. No evidence of aneurysm or vascular malformation. Review of the MIP images confirms the above findings CTV FINDINGS Superior sagittal sinus: Patent. Straight sinus: Patent. Inferior sagittal sinus, vein of Galen and internal cerebral veins: Patent. Transverse sinuses: Patent. Sigmoid sinuses: Patent. Visualized jugular veins: Patent. IMPRESSION: 1. No evidence of active extravasation into the left frontal lobe hemorrhage. Unchanged size of the hemorrhage and 7 mm left-to-right midline shift. 2. No intracranial large vessel occlusion. Moderate stenosis in the left P2 and severe stenosis in the medial left P3. Multifocal mild stenosis in the right P2 and P3. 3. 70% stenosis in the proximal left ICA, secondary to calcified and noncalcified plaque. 4. Irregularity in the distal right ICA, which may represent beading, although this may be artifactual. 5. No dural venous sinus thrombosis. 6. Small focus of ground-glass opacity in the right upper lobe, which may be infectious or inflammatory. 7. Aortic atherosclerosis. Aortic Atherosclerosis (ICD10-I70.0). Electronically Signed   By: Wiliam Ke M.D.   On: 05/08/2023 19:10   DG Chest Port 1 View Result Date: 05/08/2023 CLINICAL DATA:  Altered mental status 2-3 days ago. EXAM: PORTABLE CHEST 1 VIEW COMPARISON:  03/17/2023 FINDINGS: Heart size is normal. Mediastinal shadows are unremarkable except for mild aortic atherosclerosis. The lungs are clear. The vascularity is normal. No infiltrate, collapse or effusion. No acute bone finding. IMPRESSION: No active disease. Aortic atherosclerosis. Electronically Signed   By: Paulina Fusi M.D.   On: 05/08/2023 13:07   CT Head Wo Contrast Result Date: 05/08/2023 CLINICAL DATA:  Mental status change of unknown cause EXAM: CT HEAD WITHOUT CONTRAST TECHNIQUE: Contiguous axial images were obtained from the base of the skull through the vertex without intravenous contrast. RADIATION DOSE REDUCTION: This  exam was performed according to the departmental dose-optimization program which includes automated exposure control, adjustment of the mA and/or kV according to patient size and/or use of iterative reconstruction technique. COMPARISON:  03/17/2023 FINDINGS: Brain: Intraparenchymal hemorrhage within the left frontal lobe measuring approximately 5 x 5 x 4 cm (volume = 50 cm^3). Mild surrounding edema suggesting that this may be 29-87-day-old. Subarachnoid  hemorrhage within the sulci, actually more on the right than the left. Some blood dependent within the lateral ventricles. All of these findings are consistent with a 1-2 day old insult. Mild fullness of the ventricular system, similar to the study of November 2024. Left-to-right shift of 6 mm. Small amount of subdural blood along the left lateral convexity, maximal thickness 3-4 mm. Vascular: There is atherosclerotic calcification of the major vessels at the base of the brain. Skull: No skull fracture. Sinuses/Orbits: Clear/normal Other: None IMPRESSION: 1. Intraparenchymal hemorrhage within the left frontal lobe measuring approximately 5 x 5 x 4 cm (volume = 50 cc). Mild surrounding edema suggesting that this may be 52-35-day-old. 2. Subarachnoid hemorrhage within the sulci, actually more on the right than the left. Some blood dependent within the lateral ventricles. All of these findings are consistent with a 1-2 day old insult. 3. Left-to-right shift of 6 mm. 4. Small amount of subdural blood along the left lateral convexity, maximal thickness 3-4 mm. 5. Findings consistent with mBIG 3. 6. Critical Value/emergent results were called by telephone at the time of interpretation on 05/08/2023 at 1:00 pm to provider [facility] RAY , who verbally acknowledged these results. Electronically Signed   By: Paulina Fusi M.D.   On: 05/08/2023 13:07       HISTORY OF PRESENT ILLNESS 78 y.o. patient with history of hypertension, hyperlipidemia, PVD and dementia was admitted with 2  to 3 days of altered mental status.  HOSPITAL COURSE Patient was found to have large left frontal ICH with IVH and some cerebral edema.  Hypertonic saline was started, and blood pressure was controlled with Cleviprex.  Patient was not a neurosurgical candidate.  On 1/19, goals of care conversation was held with family and palliative care, and patient was transitioned to comfort care.    ICH:  left frontal large ICH with cytotoxic edema and mild IVH Etiology:  likely cerebral amyloid angiopathy  Code Stroke CT head -  Intraparenchymal hemorrhage within the left frontal lobe measuring approximately 5 x 5 x 4 cm (volume = 50 cc). Mild surrounding edema suggesting that this may be 44-51-day-old. Subarachnoid  hemorrhage within the sulci, actually more on the right than the left. Some blood dependent within the lateral ventricles. All of these findings are consistent with a 1-2 day old insult. Left-to-right shift of 6 mm. Small amount of subdural blood along the left lateral convexity, maximal thickness 3-4 mm. CTA head & neck 7mm left to right shift, Moderate stenosis in the left P2 and severe stenosis in the medial left P3. Multifocal mild stenosis in the right P2 and P3. 70% stenosis in the proximal left ICA, secondary to calcified and noncalcified plaque. Irregularity in the distal right ICA, which may represent beading, although this may be artifactual. CT Venogram - No dural venous sinus thrombus MRI  Large left frontal intraparenchymal hematoma with overlying subdural blood and right hemispheric multifocal subarachnoid hemorrhage. Rightward midline shift of approximately 6 mm. Blood layering within the occipital horns of both lateral ventricles. Numerous CMBs and hemosiderin suggesting CAA.  2D Echo EF 70-75%  LDL 104 HgbA1c 5.2 VTE prophylaxis - SCDs aspirin 81 mg daily and clopidogrel 75 mg daily prior to admission, now on No antithrombotic due to IPH Therapy recommendations:  Pending Disposition:  inpatient hospice   Cerebral edema  CT and MRI showed MLS 6mm NSGY consult -> not a candidate for intervention Hypertonic saline 3% 48ml/hr-> discontinued 1/19 Na 141 ->144 ->148->152 Na monitoring   Hypertension  Home meds:  Valsartan Stable BP goal < 140 given CAA  Cleviprex discontinued as patient on comfort care   Hyperlipidemia Home meds:  Zetia  LDL 104, goal < 70 Hold off Zetia  Not SATURN candidate as she is not on statin   Dysphagia Patient has post-stroke dysphagia SLP consulted NPO now No NG tube planned, allow comfort feedings as appropriate   Other stroke risk factors Advanced age   Other Active Problems Dementia - likely associated with CAA, on seroquel PTA Anxiety - PRN ativan Hypokalemia, K 3.2 -> replaced Febrile, reported fever PTA, currently afebrile Foley placed    RN Pressure Injury Documentation:     DISCHARGE EXAM  PHYSICAL EXAM General:  Alert, well-nourished, well-developed elderly patient in no acute distress Psych:  Mood and affect appropriate for situation CV: Regular rate and rhythm on monitor Respiratory:  Regular, unlabored respirations on room air GI: Abdomen soft and nontender     NEURO:  Patient is drowsy but will open eyes to voice and regard examiner.  She has no verbal output.  She blinks to threat on the right but not on the left, right facial droop is evident.  She will localize noxious stimuli with left upper extremity, does not move right upper extremity and withdraws bilateral lower extremities.   Discharge Diet       Diet   Diet NPO time specified   liquids  DISCHARGE PLAN Disposition:  Inpatient hospice Follow up with hospice providers  35 minutes were spent preparing discharge.    ATTENDING NOTE: I reviewed above note and agree with the assessment and plan. Pt was seen and examined.   No family is at the bedside. Pt eyes spontaneously open, more awake alert than yesterday, tracking bilaterally,  with good eye contact, but global aphasia, able to smile slightly with my waving to her. No gaze palsy, tracking bilaterally to voice, consistently blinking to visual threat on the right but not on the left today,which is different than yesterday. Mild right facial droop. Tongue protrusion not cooperative. LUE spontaneous movement and localizing to pain, against gravity but RUE seems to be flaccid. LLE withdraw to pain stronger than RLE, but also improved some from yesterday. Sensation, coordination and gait not tested.   Palliative care involved and family leaning toward comfort care yesterday. Today family arrived and ready to proceed. SW involved and ready for hospice transfer.   For detailed assessment and plan, please refer to above/below as I have made changes wherever appropriate.   Marvel Plan, MD PhD Stroke Neurology 05/10/2023 4:19 PM

## 2023-05-10 NOTE — Progress Notes (Signed)
SLP Cancellation Note  Patient Details Name: Carla Romero MRN: [id number] DOB: 1945/11/21   Cancelled treatment:       Reason Eval/Treat Not Completed: Other (comment) (Pt's case discussed with Glenna, RN who advised that pt's family will likely be transitioning the pt to comfort care today. It was agreed that the swallow eval and speech-language-cognition evaluation will therefore be deferred at this time, but RN agreed to contact SLP if pt's family would like for either/both to be completed.)  Kyarah Enamorado I. Vear Clock, MS, CCC-SLP Acute Rehabilitation Services Office number [phone]  Scheryl Marten 05/10/2023, 9:23 AM

## 2023-05-13 LAB — CULTURE, BLOOD (ROUTINE X 2)
Culture: NO GROWTH
Culture: NO GROWTH

## 2023-05-23 DEATH — deceased
